# Patient Record
Sex: Male | Born: 1940
Health system: Southern US, Community
[De-identification: ages and names within clinical notes are randomized; demographics above are authoritative.]

## PROBLEM LIST (undated history)

## (undated) DIAGNOSIS — Z8619 Personal history of other infectious and parasitic diseases: Secondary | ICD-10-CM

## (undated) DIAGNOSIS — H269 Unspecified cataract: Secondary | ICD-10-CM

## (undated) DIAGNOSIS — M1990 Unspecified osteoarthritis, unspecified site: Secondary | ICD-10-CM

## (undated) DIAGNOSIS — K429 Umbilical hernia without obstruction or gangrene: Secondary | ICD-10-CM

## (undated) DIAGNOSIS — M719 Bursopathy, unspecified: Secondary | ICD-10-CM

## (undated) DIAGNOSIS — N200 Calculus of kidney: Secondary | ICD-10-CM

## (undated) DIAGNOSIS — N529 Male erectile dysfunction, unspecified: Secondary | ICD-10-CM

## (undated) DIAGNOSIS — Z860100 Personal history of colon polyps, unspecified: Secondary | ICD-10-CM

## (undated) DIAGNOSIS — J45909 Unspecified asthma, uncomplicated: Secondary | ICD-10-CM

## (undated) DIAGNOSIS — T7840XA Allergy, unspecified, initial encounter: Secondary | ICD-10-CM

## (undated) DIAGNOSIS — Z87442 Personal history of urinary calculi: Secondary | ICD-10-CM

## (undated) DIAGNOSIS — C801 Malignant (primary) neoplasm, unspecified: Secondary | ICD-10-CM

## (undated) DIAGNOSIS — K649 Unspecified hemorrhoids: Secondary | ICD-10-CM

## (undated) DIAGNOSIS — E7800 Pure hypercholesterolemia, unspecified: Secondary | ICD-10-CM

## (undated) DIAGNOSIS — R972 Elevated prostate specific antigen [PSA]: Secondary | ICD-10-CM

## (undated) DIAGNOSIS — N400 Enlarged prostate without lower urinary tract symptoms: Secondary | ICD-10-CM

## (undated) DIAGNOSIS — D075 Carcinoma in situ of prostate: Secondary | ICD-10-CM

## (undated) DIAGNOSIS — E291 Testicular hypofunction: Secondary | ICD-10-CM

## (undated) DIAGNOSIS — M48061 Spinal stenosis, lumbar region without neurogenic claudication: Secondary | ICD-10-CM

## (undated) DIAGNOSIS — R011 Cardiac murmur, unspecified: Secondary | ICD-10-CM

## (undated) HISTORY — PX: CATARACT EXTRACTION, BILATERAL: SHX1313

## (undated) HISTORY — PX: EYE SURGERY: SHX253

## (undated) HISTORY — PX: COLONOSCOPY: SHX174

---

## 1962-01-10 HISTORY — PX: HERNIA REPAIR: SHX51

## 1982-01-10 HISTORY — PX: KNEE SURGERY: SHX244

## 2003-01-11 HISTORY — PX: KNEE SURGERY: SHX244

## 2003-01-17 ENCOUNTER — Encounter: Admission: RE | Admit: 2003-01-17 | Discharge: 2003-01-17 | Payer: Self-pay | Admitting: Allergy

## 2005-01-10 HISTORY — PX: CARPAL TUNNEL RELEASE: SHX101

## 2005-01-18 ENCOUNTER — Ambulatory Visit: Payer: Self-pay | Admitting: Unknown Physician Specialty

## 2005-06-29 ENCOUNTER — Ambulatory Visit: Payer: Self-pay | Admitting: Urology

## 2005-11-16 ENCOUNTER — Ambulatory Visit: Payer: Self-pay | Admitting: Urology

## 2006-04-06 ENCOUNTER — Emergency Department: Payer: Self-pay | Admitting: Unknown Physician Specialty

## 2006-04-14 ENCOUNTER — Emergency Department: Payer: Self-pay | Admitting: Unknown Physician Specialty

## 2007-01-01 ENCOUNTER — Ambulatory Visit: Payer: Self-pay | Admitting: Urology

## 2007-10-10 ENCOUNTER — Other Ambulatory Visit: Payer: Self-pay

## 2007-10-10 ENCOUNTER — Inpatient Hospital Stay: Payer: Self-pay | Admitting: General Practice

## 2007-10-10 HISTORY — PX: ANKLE FRACTURE SURGERY: SHX122

## 2007-10-10 HISTORY — PX: FRACTURE SURGERY: SHX138

## 2007-10-10 HISTORY — PX: FEMUR FRACTURE SURGERY: SHX633

## 2007-10-23 ENCOUNTER — Encounter: Payer: Self-pay | Admitting: Internal Medicine

## 2007-11-11 ENCOUNTER — Encounter: Payer: Self-pay | Admitting: Internal Medicine

## 2008-01-14 ENCOUNTER — Encounter: Payer: Self-pay | Admitting: General Practice

## 2008-01-25 ENCOUNTER — Ambulatory Visit: Payer: Self-pay | Admitting: Urology

## 2008-02-11 ENCOUNTER — Encounter: Payer: Self-pay | Admitting: General Practice

## 2008-03-10 ENCOUNTER — Encounter: Payer: Self-pay | Admitting: General Practice

## 2008-04-10 ENCOUNTER — Encounter: Payer: Self-pay | Admitting: General Practice

## 2008-05-10 ENCOUNTER — Encounter: Payer: Self-pay | Admitting: General Practice

## 2008-05-14 ENCOUNTER — Ambulatory Visit: Payer: Self-pay | Admitting: Unknown Physician Specialty

## 2008-10-27 ENCOUNTER — Ambulatory Visit: Payer: Self-pay | Admitting: Urology

## 2009-11-04 ENCOUNTER — Ambulatory Visit: Payer: Self-pay | Admitting: Urology

## 2011-05-02 ENCOUNTER — Emergency Department: Payer: Self-pay | Admitting: Emergency Medicine

## 2011-12-01 DIAGNOSIS — Z87898 Personal history of other specified conditions: Secondary | ICD-10-CM | POA: Insufficient documentation

## 2011-12-01 DIAGNOSIS — N200 Calculus of kidney: Secondary | ICD-10-CM | POA: Insufficient documentation

## 2011-12-01 DIAGNOSIS — N529 Male erectile dysfunction, unspecified: Secondary | ICD-10-CM | POA: Insufficient documentation

## 2011-12-01 DIAGNOSIS — D075 Carcinoma in situ of prostate: Secondary | ICD-10-CM | POA: Insufficient documentation

## 2012-04-18 ENCOUNTER — Emergency Department: Payer: Self-pay | Admitting: Emergency Medicine

## 2012-04-18 LAB — COMPREHENSIVE METABOLIC PANEL
Albumin: 3.3 g/dL — ABNORMAL LOW (ref 3.4–5.0)
Anion Gap: 6 — ABNORMAL LOW (ref 7–16)
Bilirubin,Total: 0.8 mg/dL (ref 0.2–1.0)
Calcium, Total: 8.1 mg/dL — ABNORMAL LOW (ref 8.5–10.1)
Chloride: 107 mmol/L (ref 98–107)
Creatinine: 0.92 mg/dL (ref 0.60–1.30)
EGFR (Non-African Amer.): >60
Glucose: 102 mg/dL — ABNORMAL HIGH (ref 65–99)
SGOT(AST): 44 U/L — ABNORMAL HIGH (ref 15–37)
Sodium: 136 mmol/L (ref 136–145)
Total Protein: 7 g/dL (ref 6.4–8.2)

## 2012-04-18 LAB — CBC
HGB: 14.7 g/dL (ref 13.0–18.0)
MCH: 33.1 pg (ref 26.0–34.0)
MCV: 96 fL (ref 80–100)
Platelet: 232 10*3/uL (ref 150–440)
RDW: 12.5 % (ref 11.5–14.5)
WBC: 7 10*3/uL (ref 3.8–10.6)

## 2012-04-18 LAB — TROPONIN I: Troponin-I: <0.02 ng/mL

## 2012-04-23 ENCOUNTER — Ambulatory Visit: Payer: Self-pay | Admitting: Urology

## 2012-04-26 DIAGNOSIS — N201 Calculus of ureter: Secondary | ICD-10-CM | POA: Insufficient documentation

## 2012-04-30 ENCOUNTER — Ambulatory Visit: Payer: Self-pay | Admitting: Urology

## 2012-05-01 ENCOUNTER — Ambulatory Visit: Payer: Self-pay | Admitting: Urology

## 2012-12-20 ENCOUNTER — Ambulatory Visit: Payer: Self-pay | Admitting: Urology

## 2013-07-15 DIAGNOSIS — N138 Other obstructive and reflux uropathy: Secondary | ICD-10-CM | POA: Insufficient documentation

## 2013-07-15 DIAGNOSIS — D126 Benign neoplasm of colon, unspecified: Secondary | ICD-10-CM | POA: Insufficient documentation

## 2013-07-15 DIAGNOSIS — N401 Enlarged prostate with lower urinary tract symptoms: Secondary | ICD-10-CM | POA: Insufficient documentation

## 2013-07-15 DIAGNOSIS — N23 Unspecified renal colic: Secondary | ICD-10-CM | POA: Insufficient documentation

## 2013-11-20 ENCOUNTER — Emergency Department: Payer: Self-pay | Admitting: Emergency Medicine

## 2014-02-05 DIAGNOSIS — X32XXXA Exposure to sunlight, initial encounter: Secondary | ICD-10-CM | POA: Diagnosis not present

## 2014-02-05 DIAGNOSIS — L821 Other seborrheic keratosis: Secondary | ICD-10-CM | POA: Diagnosis not present

## 2014-02-05 DIAGNOSIS — D492 Neoplasm of unspecified behavior of bone, soft tissue, and skin: Secondary | ICD-10-CM | POA: Diagnosis not present

## 2014-02-05 DIAGNOSIS — Z85828 Personal history of other malignant neoplasm of skin: Secondary | ICD-10-CM | POA: Diagnosis not present

## 2014-02-05 DIAGNOSIS — D485 Neoplasm of uncertain behavior of skin: Secondary | ICD-10-CM | POA: Diagnosis not present

## 2014-02-05 DIAGNOSIS — L570 Actinic keratosis: Secondary | ICD-10-CM | POA: Diagnosis not present

## 2014-02-06 DIAGNOSIS — N401 Enlarged prostate with lower urinary tract symptoms: Secondary | ICD-10-CM | POA: Diagnosis not present

## 2014-02-06 DIAGNOSIS — N529 Male erectile dysfunction, unspecified: Secondary | ICD-10-CM | POA: Diagnosis not present

## 2014-02-06 DIAGNOSIS — R339 Retention of urine, unspecified: Secondary | ICD-10-CM | POA: Insufficient documentation

## 2014-02-06 DIAGNOSIS — R351 Nocturia: Secondary | ICD-10-CM | POA: Diagnosis not present

## 2014-02-06 DIAGNOSIS — C61 Malignant neoplasm of prostate: Secondary | ICD-10-CM | POA: Diagnosis not present

## 2014-02-06 DIAGNOSIS — R972 Elevated prostate specific antigen [PSA]: Secondary | ICD-10-CM | POA: Diagnosis not present

## 2014-02-06 DIAGNOSIS — L820 Inflamed seborrheic keratosis: Secondary | ICD-10-CM | POA: Diagnosis not present

## 2014-02-06 DIAGNOSIS — D075 Carcinoma in situ of prostate: Secondary | ICD-10-CM | POA: Diagnosis not present

## 2014-02-06 DIAGNOSIS — K589 Irritable bowel syndrome without diarrhea: Secondary | ICD-10-CM | POA: Diagnosis not present

## 2014-02-06 DIAGNOSIS — N200 Calculus of kidney: Secondary | ICD-10-CM | POA: Diagnosis not present

## 2014-02-06 DIAGNOSIS — D0462 Carcinoma in situ of skin of left upper limb, including shoulder: Secondary | ICD-10-CM | POA: Diagnosis not present

## 2014-02-06 DIAGNOSIS — N201 Calculus of ureter: Secondary | ICD-10-CM | POA: Diagnosis not present

## 2014-02-06 DIAGNOSIS — M2550 Pain in unspecified joint: Secondary | ICD-10-CM | POA: Diagnosis not present

## 2014-02-07 DIAGNOSIS — N63 Unspecified lump in breast: Secondary | ICD-10-CM | POA: Diagnosis not present

## 2014-02-10 DIAGNOSIS — D0462 Carcinoma in situ of skin of left upper limb, including shoulder: Secondary | ICD-10-CM | POA: Diagnosis not present

## 2014-02-19 DIAGNOSIS — D171 Benign lipomatous neoplasm of skin and subcutaneous tissue of trunk: Secondary | ICD-10-CM | POA: Diagnosis not present

## 2014-02-19 DIAGNOSIS — D179 Benign lipomatous neoplasm, unspecified: Secondary | ICD-10-CM | POA: Diagnosis not present

## 2014-04-23 DIAGNOSIS — E7800 Pure hypercholesterolemia, unspecified: Secondary | ICD-10-CM | POA: Insufficient documentation

## 2014-04-23 DIAGNOSIS — J45909 Unspecified asthma, uncomplicated: Secondary | ICD-10-CM | POA: Insufficient documentation

## 2014-04-24 DIAGNOSIS — Z125 Encounter for screening for malignant neoplasm of prostate: Secondary | ICD-10-CM | POA: Diagnosis not present

## 2014-05-02 NOTE — Op Note (Signed)
PATIENT NAME:  Devin Landry, Devin Landry MR#:  [id number] DATE OF BIRTH:  1940/06/22  DATE OF PROCEDURE:  05/01/2012  PRINCIPAL DIAGNOSIS: Left ureterolithiasis.   POSTOPERATIVE DIAGNOSIS: Left ureterolithiasis.   PROCEDURE: Left ureteroscopy, holmium laser lithotripsy.   SURGEON: Denice Bors. Jacqlyn Larsen, MD.   ANESTHESIA: General endotracheal anesthesia.   INDICATION: The patient is a 74 year old gentleman with a history of nephrolithiasis. He presented with left flank pain. He underwent recent evaluation demonstrating a distal left ureteral calculus. He presents for left ureteroscopy and holmium laser lithotripsy.   DESCRIPTION OF PROCEDURE: After informed consent was obtained, the patient was taken to the operating room and placed in the dorsal lithotomy position under laryengeal airway anesthesia. He was then prepped and draped in the usual standard fashion. Cystoscopy was performed with a 22-French rigid cystoscope. No significant urethral abnormalities were noted. Upon entering the prostatic urethra, moderate bilobar prostatic hypertrophy was noted. Upon entering the bladder, the mucosa was inspected in its entirety with no gross mucosal lesions noted. Bilateral ureteral orifices were well visualized with no lesions noted. A flexible-tip Glidewire was introduced in the left ureteral orifice and advanced into the upper pole collecting system under fluoroscopic guidance without difficulty. The cystoscope was then removed. The 6-French rigid ureteroscope was advanced into the urinary bladder and was advanced into the left ureteral orifice without difficulty. Approximately 3 cm into the left ureteral orifice, an approximately 7 mm stone was encountered. Moderate inflammatory changes were noted at the site of stone impaction. The holmium laser fiber was then inserted through the scope. The stone was then fragmented into multiple smaller pieces utilizing the holmium laser fiber. Once the stone was fragmented, the basket  was then utilized to remove the stone fragments utilizing multiple stone passages. Once the multiple stone fragments were removed from the ureter, the scope was advanced to the level of the ureteropelvic junction. No additional stone fragments were noted. Moderate dilation of the proximal and mid ureter was noted throughout the entirety with no additional abnormalities noted. The ureteroscope was then removed. The cystoscope was replaced into the urinary bladder. The stone fragments were collected and will be sent for stone analysis. The decision was made not to place a ureteral stent. The bladder was then drained. The cystoscope was removed. The patient was returned to the supine position and awakened from laryngeal mask airway anesthesia. He was taken to the recovery room in stable condition. There were no problems or complications. The patient tolerated the procedure well.   ESTIMATED BLOOD LOSS: Minimal.    ____________________________ Denice Bors. Jacqlyn Larsen, MD bsc:gb D: 05/04/2012 00:32:01 ET T: 05/04/2012 02:16:36 ET JOB#: [id number]  cc: Denice Bors. Jacqlyn Larsen, MD, <Dictator> Denice Bors Dalynn Jhaveri MD ELECTRONICALLY SIGNED 05/04/2012 8:23

## 2014-06-11 DIAGNOSIS — M5441 Lumbago with sciatica, right side: Secondary | ICD-10-CM | POA: Diagnosis not present

## 2014-06-30 DIAGNOSIS — N400 Enlarged prostate without lower urinary tract symptoms: Secondary | ICD-10-CM | POA: Diagnosis not present

## 2014-06-30 DIAGNOSIS — E780 Pure hypercholesterolemia: Secondary | ICD-10-CM | POA: Diagnosis not present

## 2014-06-30 DIAGNOSIS — J4520 Mild intermittent asthma, uncomplicated: Secondary | ICD-10-CM | POA: Diagnosis not present

## 2014-06-30 DIAGNOSIS — E119 Type 2 diabetes mellitus without complications: Secondary | ICD-10-CM | POA: Diagnosis not present

## 2014-07-07 DIAGNOSIS — Z0001 Encounter for general adult medical examination with abnormal findings: Secondary | ICD-10-CM | POA: Diagnosis not present

## 2014-07-08 DIAGNOSIS — Z85828 Personal history of other malignant neoplasm of skin: Secondary | ICD-10-CM | POA: Diagnosis not present

## 2014-07-08 DIAGNOSIS — D485 Neoplasm of uncertain behavior of skin: Secondary | ICD-10-CM | POA: Diagnosis not present

## 2014-07-08 DIAGNOSIS — L821 Other seborrheic keratosis: Secondary | ICD-10-CM | POA: Diagnosis not present

## 2014-07-08 DIAGNOSIS — L570 Actinic keratosis: Secondary | ICD-10-CM | POA: Diagnosis not present

## 2014-07-08 DIAGNOSIS — C44519 Basal cell carcinoma of skin of other part of trunk: Secondary | ICD-10-CM | POA: Diagnosis not present

## 2014-07-08 DIAGNOSIS — X32XXXA Exposure to sunlight, initial encounter: Secondary | ICD-10-CM | POA: Diagnosis not present

## 2014-07-16 DIAGNOSIS — R1031 Right lower quadrant pain: Secondary | ICD-10-CM | POA: Diagnosis not present

## 2014-07-22 DIAGNOSIS — R1031 Right lower quadrant pain: Secondary | ICD-10-CM | POA: Diagnosis not present

## 2014-08-19 DIAGNOSIS — L905 Scar conditions and fibrosis of skin: Secondary | ICD-10-CM | POA: Diagnosis not present

## 2014-08-19 DIAGNOSIS — C44519 Basal cell carcinoma of skin of other part of trunk: Secondary | ICD-10-CM | POA: Diagnosis not present

## 2014-09-29 DIAGNOSIS — E119 Type 2 diabetes mellitus without complications: Secondary | ICD-10-CM | POA: Diagnosis not present

## 2014-10-06 DIAGNOSIS — E1165 Type 2 diabetes mellitus with hyperglycemia: Secondary | ICD-10-CM | POA: Diagnosis not present

## 2014-10-06 DIAGNOSIS — Z23 Encounter for immunization: Secondary | ICD-10-CM | POA: Diagnosis not present

## 2014-10-06 DIAGNOSIS — J4520 Mild intermittent asthma, uncomplicated: Secondary | ICD-10-CM | POA: Diagnosis not present

## 2014-11-25 DIAGNOSIS — X32XXXA Exposure to sunlight, initial encounter: Secondary | ICD-10-CM | POA: Diagnosis not present

## 2014-11-25 DIAGNOSIS — D225 Melanocytic nevi of trunk: Secondary | ICD-10-CM | POA: Diagnosis not present

## 2014-11-25 DIAGNOSIS — D2262 Melanocytic nevi of left upper limb, including shoulder: Secondary | ICD-10-CM | POA: Diagnosis not present

## 2014-11-25 DIAGNOSIS — L570 Actinic keratosis: Secondary | ICD-10-CM | POA: Diagnosis not present

## 2014-11-25 DIAGNOSIS — L821 Other seborrheic keratosis: Secondary | ICD-10-CM | POA: Diagnosis not present

## 2014-11-25 DIAGNOSIS — Z85828 Personal history of other malignant neoplasm of skin: Secondary | ICD-10-CM | POA: Diagnosis not present

## 2014-12-23 DIAGNOSIS — J0120 Acute ethmoidal sinusitis, unspecified: Secondary | ICD-10-CM | POA: Diagnosis not present

## 2014-12-30 DIAGNOSIS — E1165 Type 2 diabetes mellitus with hyperglycemia: Secondary | ICD-10-CM | POA: Diagnosis not present

## 2015-01-06 DIAGNOSIS — E1165 Type 2 diabetes mellitus with hyperglycemia: Secondary | ICD-10-CM | POA: Diagnosis not present

## 2015-01-06 DIAGNOSIS — N200 Calculus of kidney: Secondary | ICD-10-CM | POA: Diagnosis not present

## 2015-01-06 DIAGNOSIS — J4520 Mild intermittent asthma, uncomplicated: Secondary | ICD-10-CM | POA: Diagnosis not present

## 2015-01-06 DIAGNOSIS — M25579 Pain in unspecified ankle and joints of unspecified foot: Secondary | ICD-10-CM | POA: Diagnosis not present

## 2015-01-13 DIAGNOSIS — M19172 Post-traumatic osteoarthritis, left ankle and foot: Secondary | ICD-10-CM | POA: Diagnosis not present

## 2015-01-13 DIAGNOSIS — L03031 Cellulitis of right toe: Secondary | ICD-10-CM | POA: Diagnosis not present

## 2015-01-13 DIAGNOSIS — M25572 Pain in left ankle and joints of left foot: Secondary | ICD-10-CM | POA: Diagnosis not present

## 2015-01-27 DIAGNOSIS — L03031 Cellulitis of right toe: Secondary | ICD-10-CM | POA: Diagnosis not present

## 2015-01-29 DIAGNOSIS — H524 Presbyopia: Secondary | ICD-10-CM | POA: Diagnosis not present

## 2015-01-29 DIAGNOSIS — H5210 Myopia, unspecified eye: Secondary | ICD-10-CM | POA: Diagnosis not present

## 2015-02-05 DIAGNOSIS — L309 Dermatitis, unspecified: Secondary | ICD-10-CM | POA: Diagnosis not present

## 2015-02-05 DIAGNOSIS — L538 Other specified erythematous conditions: Secondary | ICD-10-CM | POA: Diagnosis not present

## 2015-02-05 DIAGNOSIS — L0889 Other specified local infections of the skin and subcutaneous tissue: Secondary | ICD-10-CM | POA: Diagnosis not present

## 2015-02-05 DIAGNOSIS — B078 Other viral warts: Secondary | ICD-10-CM | POA: Diagnosis not present

## 2015-02-05 DIAGNOSIS — R208 Other disturbances of skin sensation: Secondary | ICD-10-CM | POA: Diagnosis not present

## 2015-02-11 DIAGNOSIS — N200 Calculus of kidney: Secondary | ICD-10-CM | POA: Diagnosis not present

## 2015-02-11 DIAGNOSIS — R339 Retention of urine, unspecified: Secondary | ICD-10-CM | POA: Diagnosis not present

## 2015-02-11 DIAGNOSIS — N3289 Other specified disorders of bladder: Secondary | ICD-10-CM | POA: Diagnosis not present

## 2015-02-11 DIAGNOSIS — R972 Elevated prostate specific antigen [PSA]: Secondary | ICD-10-CM | POA: Diagnosis not present

## 2015-02-11 DIAGNOSIS — D075 Carcinoma in situ of prostate: Secondary | ICD-10-CM | POA: Diagnosis not present

## 2015-02-11 DIAGNOSIS — N401 Enlarged prostate with lower urinary tract symptoms: Secondary | ICD-10-CM | POA: Diagnosis not present

## 2015-02-11 DIAGNOSIS — N138 Other obstructive and reflux uropathy: Secondary | ICD-10-CM | POA: Diagnosis not present

## 2015-02-20 DIAGNOSIS — J042 Acute laryngotracheitis: Secondary | ICD-10-CM | POA: Diagnosis not present

## 2015-05-14 DIAGNOSIS — E1165 Type 2 diabetes mellitus with hyperglycemia: Secondary | ICD-10-CM | POA: Diagnosis not present

## 2015-05-19 DIAGNOSIS — R7301 Impaired fasting glucose: Secondary | ICD-10-CM | POA: Diagnosis not present

## 2015-05-19 DIAGNOSIS — Z125 Encounter for screening for malignant neoplasm of prostate: Secondary | ICD-10-CM | POA: Diagnosis not present

## 2015-05-19 DIAGNOSIS — E7800 Pure hypercholesterolemia, unspecified: Secondary | ICD-10-CM | POA: Diagnosis not present

## 2015-05-26 DIAGNOSIS — L570 Actinic keratosis: Secondary | ICD-10-CM | POA: Diagnosis not present

## 2015-05-26 DIAGNOSIS — D225 Melanocytic nevi of trunk: Secondary | ICD-10-CM | POA: Diagnosis not present

## 2015-05-26 DIAGNOSIS — D2271 Melanocytic nevi of right lower limb, including hip: Secondary | ICD-10-CM | POA: Diagnosis not present

## 2015-05-26 DIAGNOSIS — Z85828 Personal history of other malignant neoplasm of skin: Secondary | ICD-10-CM | POA: Diagnosis not present

## 2015-05-26 DIAGNOSIS — X32XXXA Exposure to sunlight, initial encounter: Secondary | ICD-10-CM | POA: Diagnosis not present

## 2015-05-26 DIAGNOSIS — I8311 Varicose veins of right lower extremity with inflammation: Secondary | ICD-10-CM | POA: Diagnosis not present

## 2015-08-12 DIAGNOSIS — E7800 Pure hypercholesterolemia, unspecified: Secondary | ICD-10-CM | POA: Diagnosis not present

## 2015-08-12 DIAGNOSIS — R7301 Impaired fasting glucose: Secondary | ICD-10-CM | POA: Diagnosis not present

## 2015-08-12 DIAGNOSIS — Z125 Encounter for screening for malignant neoplasm of prostate: Secondary | ICD-10-CM | POA: Diagnosis not present

## 2015-08-19 DIAGNOSIS — Z0001 Encounter for general adult medical examination with abnormal findings: Secondary | ICD-10-CM | POA: Diagnosis not present

## 2015-08-19 DIAGNOSIS — E1165 Type 2 diabetes mellitus with hyperglycemia: Secondary | ICD-10-CM | POA: Diagnosis not present

## 2015-08-19 DIAGNOSIS — E7800 Pure hypercholesterolemia, unspecified: Secondary | ICD-10-CM | POA: Diagnosis not present

## 2015-11-06 DIAGNOSIS — Z23 Encounter for immunization: Secondary | ICD-10-CM | POA: Diagnosis not present

## 2015-11-06 DIAGNOSIS — Z0000 Encounter for general adult medical examination without abnormal findings: Secondary | ICD-10-CM | POA: Diagnosis not present

## 2015-12-02 DIAGNOSIS — R739 Hyperglycemia, unspecified: Secondary | ICD-10-CM | POA: Diagnosis not present

## 2015-12-09 DIAGNOSIS — K006 Disturbances in tooth eruption: Secondary | ICD-10-CM | POA: Diagnosis not present

## 2015-12-14 DIAGNOSIS — E1165 Type 2 diabetes mellitus with hyperglycemia: Secondary | ICD-10-CM | POA: Diagnosis not present

## 2015-12-14 DIAGNOSIS — J45909 Unspecified asthma, uncomplicated: Secondary | ICD-10-CM | POA: Diagnosis not present

## 2016-01-26 DIAGNOSIS — L821 Other seborrheic keratosis: Secondary | ICD-10-CM | POA: Diagnosis not present

## 2016-01-26 DIAGNOSIS — L570 Actinic keratosis: Secondary | ICD-10-CM | POA: Diagnosis not present

## 2016-01-26 DIAGNOSIS — Z85828 Personal history of other malignant neoplasm of skin: Secondary | ICD-10-CM | POA: Diagnosis not present

## 2016-01-26 DIAGNOSIS — D485 Neoplasm of uncertain behavior of skin: Secondary | ICD-10-CM | POA: Diagnosis not present

## 2016-01-26 DIAGNOSIS — C44619 Basal cell carcinoma of skin of left upper limb, including shoulder: Secondary | ICD-10-CM | POA: Diagnosis not present

## 2016-01-26 DIAGNOSIS — X32XXXA Exposure to sunlight, initial encounter: Secondary | ICD-10-CM | POA: Diagnosis not present

## 2016-01-26 DIAGNOSIS — D225 Melanocytic nevi of trunk: Secondary | ICD-10-CM | POA: Diagnosis not present

## 2016-02-01 DIAGNOSIS — C44619 Basal cell carcinoma of skin of left upper limb, including shoulder: Secondary | ICD-10-CM | POA: Diagnosis not present

## 2016-02-03 DIAGNOSIS — H524 Presbyopia: Secondary | ICD-10-CM | POA: Diagnosis not present

## 2016-03-08 DIAGNOSIS — E1165 Type 2 diabetes mellitus with hyperglycemia: Secondary | ICD-10-CM | POA: Diagnosis not present

## 2016-03-15 DIAGNOSIS — E7800 Pure hypercholesterolemia, unspecified: Secondary | ICD-10-CM | POA: Diagnosis not present

## 2016-03-15 DIAGNOSIS — R739 Hyperglycemia, unspecified: Secondary | ICD-10-CM | POA: Diagnosis not present

## 2016-03-24 DIAGNOSIS — N200 Calculus of kidney: Secondary | ICD-10-CM | POA: Diagnosis not present

## 2016-03-24 DIAGNOSIS — R972 Elevated prostate specific antigen [PSA]: Secondary | ICD-10-CM | POA: Diagnosis not present

## 2016-03-24 DIAGNOSIS — N138 Other obstructive and reflux uropathy: Secondary | ICD-10-CM | POA: Diagnosis not present

## 2016-03-24 DIAGNOSIS — R339 Retention of urine, unspecified: Secondary | ICD-10-CM | POA: Diagnosis not present

## 2016-03-24 DIAGNOSIS — D075 Carcinoma in situ of prostate: Secondary | ICD-10-CM | POA: Diagnosis not present

## 2016-03-24 DIAGNOSIS — N401 Enlarged prostate with lower urinary tract symptoms: Secondary | ICD-10-CM | POA: Diagnosis not present

## 2016-05-31 DIAGNOSIS — D2272 Melanocytic nevi of left lower limb, including hip: Secondary | ICD-10-CM | POA: Diagnosis not present

## 2016-05-31 DIAGNOSIS — Z85828 Personal history of other malignant neoplasm of skin: Secondary | ICD-10-CM | POA: Diagnosis not present

## 2016-05-31 DIAGNOSIS — X32XXXA Exposure to sunlight, initial encounter: Secondary | ICD-10-CM | POA: Diagnosis not present

## 2016-05-31 DIAGNOSIS — D2262 Melanocytic nevi of left upper limb, including shoulder: Secondary | ICD-10-CM | POA: Diagnosis not present

## 2016-05-31 DIAGNOSIS — D225 Melanocytic nevi of trunk: Secondary | ICD-10-CM | POA: Diagnosis not present

## 2016-05-31 DIAGNOSIS — D485 Neoplasm of uncertain behavior of skin: Secondary | ICD-10-CM | POA: Diagnosis not present

## 2016-05-31 DIAGNOSIS — C4441 Basal cell carcinoma of skin of scalp and neck: Secondary | ICD-10-CM | POA: Diagnosis not present

## 2016-05-31 DIAGNOSIS — L570 Actinic keratosis: Secondary | ICD-10-CM | POA: Diagnosis not present

## 2016-06-14 DIAGNOSIS — D0422 Carcinoma in situ of skin of left ear and external auricular canal: Secondary | ICD-10-CM | POA: Diagnosis not present

## 2016-06-14 DIAGNOSIS — C4441 Basal cell carcinoma of skin of scalp and neck: Secondary | ICD-10-CM | POA: Diagnosis not present

## 2016-06-20 DIAGNOSIS — Z86010 Personal history of colonic polyps: Secondary | ICD-10-CM | POA: Diagnosis not present

## 2016-09-09 DIAGNOSIS — R739 Hyperglycemia, unspecified: Secondary | ICD-10-CM | POA: Diagnosis not present

## 2016-09-09 DIAGNOSIS — E7800 Pure hypercholesterolemia, unspecified: Secondary | ICD-10-CM | POA: Diagnosis not present

## 2016-09-15 DIAGNOSIS — E7800 Pure hypercholesterolemia, unspecified: Secondary | ICD-10-CM | POA: Diagnosis not present

## 2016-09-15 DIAGNOSIS — N400 Enlarged prostate without lower urinary tract symptoms: Secondary | ICD-10-CM | POA: Diagnosis not present

## 2016-09-15 DIAGNOSIS — Z0001 Encounter for general adult medical examination with abnormal findings: Secondary | ICD-10-CM | POA: Diagnosis not present

## 2016-09-15 DIAGNOSIS — J45909 Unspecified asthma, uncomplicated: Secondary | ICD-10-CM | POA: Diagnosis not present

## 2016-09-20 ENCOUNTER — Encounter: Payer: Self-pay | Admitting: *Deleted

## 2016-09-21 ENCOUNTER — Ambulatory Visit
Admission: RE | Admit: 2016-09-21 | Discharge: 2016-09-21 | Disposition: A | Discharge status: Home / Self Care | Payer: Medicare HMO | Source: Ambulatory Visit | Attending: Unknown Physician Specialty | Admitting: Unknown Physician Specialty

## 2016-09-21 ENCOUNTER — Encounter: Payer: Self-pay | Admitting: Anesthesiology

## 2016-09-21 ENCOUNTER — Ambulatory Visit: Payer: Medicare HMO | Admitting: Anesthesiology

## 2016-09-21 ENCOUNTER — Encounter
Admission: RE | Disposition: A | Discharge status: Home / Self Care | Payer: Self-pay | Source: Ambulatory Visit | Attending: Unknown Physician Specialty

## 2016-09-21 DIAGNOSIS — Z79899 Other long term (current) drug therapy: Secondary | ICD-10-CM | POA: Insufficient documentation

## 2016-09-21 DIAGNOSIS — K5730 Diverticulosis of large intestine without perforation or abscess without bleeding: Secondary | ICD-10-CM | POA: Diagnosis not present

## 2016-09-21 DIAGNOSIS — Z1211 Encounter for screening for malignant neoplasm of colon: Secondary | ICD-10-CM | POA: Diagnosis not present

## 2016-09-21 DIAGNOSIS — Z87891 Personal history of nicotine dependence: Secondary | ICD-10-CM | POA: Diagnosis not present

## 2016-09-21 DIAGNOSIS — J45909 Unspecified asthma, uncomplicated: Secondary | ICD-10-CM | POA: Insufficient documentation

## 2016-09-21 DIAGNOSIS — K5790 Diverticulosis of intestine, part unspecified, without perforation or abscess without bleeding: Secondary | ICD-10-CM | POA: Diagnosis not present

## 2016-09-21 DIAGNOSIS — Z87442 Personal history of urinary calculi: Secondary | ICD-10-CM | POA: Diagnosis not present

## 2016-09-21 DIAGNOSIS — K648 Other hemorrhoids: Secondary | ICD-10-CM | POA: Diagnosis not present

## 2016-09-21 DIAGNOSIS — M1990 Unspecified osteoarthritis, unspecified site: Secondary | ICD-10-CM | POA: Diagnosis not present

## 2016-09-21 DIAGNOSIS — K640 First degree hemorrhoids: Secondary | ICD-10-CM | POA: Insufficient documentation

## 2016-09-21 DIAGNOSIS — Z86010 Personal history of colonic polyps: Secondary | ICD-10-CM | POA: Diagnosis not present

## 2016-09-21 DIAGNOSIS — Z7982 Long term (current) use of aspirin: Secondary | ICD-10-CM | POA: Insufficient documentation

## 2016-09-21 DIAGNOSIS — N400 Enlarged prostate without lower urinary tract symptoms: Secondary | ICD-10-CM | POA: Insufficient documentation

## 2016-09-21 HISTORY — DX: Unspecified hemorrhoids: K64.9

## 2016-09-21 HISTORY — PX: COLONOSCOPY WITH PROPOFOL: SHX5780

## 2016-09-21 HISTORY — DX: Unspecified osteoarthritis, unspecified site: M19.90

## 2016-09-21 HISTORY — DX: Testicular hypofunction: E29.1

## 2016-09-21 HISTORY — DX: Personal history of colonic polyps: Z86.010

## 2016-09-21 HISTORY — DX: Unspecified asthma, uncomplicated: J45.909

## 2016-09-21 HISTORY — DX: Personal history of other infectious and parasitic diseases: Z86.19

## 2016-09-21 HISTORY — DX: Calculus of kidney: N20.0

## 2016-09-21 HISTORY — DX: Benign prostatic hyperplasia without lower urinary tract symptoms: N40.0

## 2016-09-21 HISTORY — DX: Personal history of urinary calculi: Z87.442

## 2016-09-21 HISTORY — DX: Personal history of colon polyps, unspecified: Z86.0100

## 2016-09-21 HISTORY — DX: Allergy, unspecified, initial encounter: T78.40XA

## 2016-09-21 HISTORY — DX: Unspecified cataract: H26.9

## 2016-09-21 SURGERY — COLONOSCOPY WITH PROPOFOL
Anesthesia: General

## 2016-09-21 MED ORDER — PROPOFOL 10 MG/ML IV BOLUS
INTRAVENOUS | Status: AC
  Filled 2016-09-21: qty 60

## 2016-09-21 MED ORDER — SODIUM CHLORIDE 0.9 % IV SOLN
INTRAVENOUS | Status: DC
  Administered 2016-09-21 (×2): via INTRAVENOUS

## 2016-09-21 MED ORDER — SODIUM CHLORIDE 0.9 % IV SOLN: INTRAVENOUS | Status: DC

## 2016-09-21 MED ORDER — PROPOFOL 500 MG/50ML IV EMUL
INTRAVENOUS | Status: DC | PRN
  Administered 2016-09-21: 100 ug/kg/min via INTRAVENOUS

## 2016-09-21 MED ORDER — PROPOFOL 10 MG/ML IV BOLUS
INTRAVENOUS | Status: DC | PRN
  Administered 2016-09-21: 60 mg via INTRAVENOUS
  Administered 2016-09-21: 30 mg via INTRAVENOUS

## 2016-09-21 MED ORDER — PIPERACILLIN-TAZOBACTAM 3.375 G IVPB 30 MIN
3.3750 g | Freq: Once | INTRAVENOUS | Status: AC
  Administered 2016-09-21: 3.375 g via INTRAVENOUS
  Filled 2016-09-21: qty 50

## 2016-09-21 MED ORDER — PIPERACILLIN-TAZOBACTAM 3.375 G IVPB
INTRAVENOUS | Status: AC
  Administered 2016-09-21: 3.375 g via INTRAVENOUS
  Filled 2016-09-21: qty 50

## 2016-09-21 NOTE — Anesthesia Preprocedure Evaluation (Signed)
Anesthesia Evaluation  Patient identified by MRN, date of birth, ID band Patient awake    Reviewed: Allergy & Precautions, H&P , NPO status , Patient's Chart, lab work & pertinent test results  History of Anesthesia Complications Negative for: history of anesthetic complications  Airway Mallampati: III  TM Distance: <3 FB Neck ROM: limited    Dental  (+) Poor Dentition, Chipped, Missing, Caps, Implants   Pulmonary neg shortness of breath, asthma , former smoker,           Cardiovascular Exercise Tolerance: Good (-) angina(-) Past MI and (-) DOE negative cardio ROS       Neuro/Psych negative neurological ROS  negative psych ROS   GI/Hepatic negative GI ROS, Neg liver ROS,   Endo/Other  negative endocrine ROS  Renal/GU Renal disease  negative genitourinary   Musculoskeletal  (+) Arthritis ,   Abdominal   Peds  Hematology negative hematology ROS (+)   Anesthesia Other Findings Past Medical History: No date: Allergic state No date: Arthritis No date: Asthma No date: BPH (benign prostatic hyperplasia) No date: Cataracts, bilateral No date: Hemorrhoids No date: History of chicken pox No date: History of colon polyps No date: History of kidney stones No date: Hypogonadism in male No date: Nephrolithiasis  Past Surgical History: No date: EYE SURGERY No date: FRACTURE SURGERY; Left     Comment:  ORIF DISTAL RADIUS FRACTURE AND PERTROCHANTERIC FEMUR               FRACTURE No date: HERNIA REPAIR No date: KNEE SURGERY     Reproductive/Obstetrics negative OB ROS                             Anesthesia Physical Anesthesia Plan  ASA: III  Anesthesia Plan: General   Post-op Pain Management:    Induction: Intravenous  PONV Risk Score and Plan: Propofol infusion  Airway Management Planned: Natural Airway and Nasal Cannula  Additional Equipment:   Intra-op Plan:    Post-operative Plan:   Informed Consent: I have reviewed the patients History and Physical, chart, labs and discussed the procedure including the risks, benefits and alternatives for the proposed anesthesia with the patient or authorized representative who has indicated his/her understanding and acceptance.   Dental Advisory Given  Plan Discussed with: Anesthesiologist, CRNA and Surgeon  Anesthesia Plan Comments: (Patient consented for risks of anesthesia including but not limited to:  - adverse reactions to medications - risk of intubation if required - damage to teeth, lips or other oral mucosa - sore throat or hoarseness - Damage to heart, brain, lungs or loss of life  Patient voiced understanding.)        Anesthesia Quick Evaluation

## 2016-09-21 NOTE — Anesthesia Post-op Follow-up Note (Signed)
Anesthesia QCDR form completed.        

## 2016-09-21 NOTE — H&P (Signed)
Primary Care Physician:  Madelyn Brunner, MD Primary Gastroenterologist:  Dr. Vira Agar  Pre-Procedure History & Physical: HPI:  Devin Landry is a 76 y.o. male is here for an colonoscopy.   Past Medical History:  Diagnosis Date  . Allergic state   . Arthritis   . Asthma   . BPH (benign prostatic hyperplasia)   . Cataracts, bilateral   . Hemorrhoids   . History of chicken pox   . History of colon polyps   . History of kidney stones   . Hypogonadism in male   . Nephrolithiasis     Past Surgical History:  Procedure Laterality Date  . EYE SURGERY    . FRACTURE SURGERY Left    ORIF DISTAL RADIUS FRACTURE AND PERTROCHANTERIC FEMUR FRACTURE  . HERNIA REPAIR    . KNEE SURGERY      Prior to Admission medications   Medication Sig Start Date End Date Taking? Authorizing Provider  acetaminophen (TYLENOL) 500 MG tablet Take 500 mg by mouth every 4 (four) hours as needed.   Yes [provider]  amoxicillin (AMOXIL) 500 MG capsule Take 500 mg by mouth 4 (four) times daily.   Yes [provider]  Ascorbic Acid (VITAMIN C) 1000 MG tablet Take 1,000 mg by mouth daily.   Yes [provider]  aspirin EC 81 MG tablet Take 81 mg by mouth daily.   Yes [provider]  calcium carbonate (OSCAL) 1500 (600 Ca) MG TABS tablet Take by mouth 2 (two) times daily with a meal.   Yes [provider]  Cholecalciferol (VITAMIN D-3) 1000 units CAPS Take 1,000 Units by mouth daily.   Yes [provider]  cholestyramine Lucrezia Starch) 4 GM/DOSE powder Take 4 g by mouth daily.   Yes [provider]  diphenhydrAMINE (BENADRYL) 25 MG tablet Take 25 mg by mouth at bedtime as needed.   Yes [provider]  finasteride (PROSCAR) 5 MG tablet Take 5 mg by mouth daily.   Yes [provider]  Glucosamine-Chondroit-Vit C-Mn (GLUCOSAMINE 1500 COMPLEX PO) Take 1,500 mg by mouth 2 (two) times daily.   Yes [provider]  Multiple  Vitamin (MULTIVITAMIN) tablet Take 1 tablet by mouth daily.   Yes [provider]  naproxen sodium (ANAPROX) 220 MG tablet Take 440 mg by mouth 2 (two) times daily with a meal.   Yes [provider]  polyethylene glycol-electrolytes (NULYTELY/GOLYTELY) 420 g solution Take 4,000 mLs by mouth once.   Yes [provider]  rosuvastatin (CRESTOR) 5 MG tablet Take 5 mg by mouth daily.   Yes [provider]  tizanidine (ZANAFLEX) 2 MG capsule Take 2 mg by mouth 3 (three) times daily.   Yes [provider]  TURMERIC PO Take 500 mg by mouth daily.   Yes [provider]    Allergies as of 06/30/2016  . (Not on File)    History reviewed. No pertinent family history.  Social History   Social History  . Marital status: Married    Spouse name: N/A  . Number of children: N/A  . Years of education: N/A   Occupational History  . Not on file.   Social History Main Topics  . Smoking status: Former Smoker    Years: 20.00    Types: Pipe    Quit date: 05/15/1978  . Smokeless tobacco: Never Used  . Alcohol use No  . Drug use: No  . Sexual activity: Not on file  Other Topics Concern  . Not on file   Social History Narrative  . No narrative on file    Review of Systems: See HPI, otherwise negative ROS  Physical Exam: BP 136/83   Pulse 84   Temp (!) 96.9 F (36.1 C) (Oral)   Resp 20   Ht 6' (1.829 m)   Wt 96.2 kg (212 lb)   SpO2 96%   BMI 28.75 kg/m  General:   Alert,  pleasant and cooperative in NAD Head:  Normocephalic and atraumatic. Neck:  Supple; no masses or thyromegaly. Lungs:  Clear throughout to auscultation.    Heart:  Regular rate and rhythm. Abdomen:  Soft, nontender and nondistended. Normal bowel sounds, without guarding, and without rebound.   Neurologic:  Alert and  oriented x4;  grossly normal neurologically.  Impression/Plan: Devin Landry is here for an colonoscopy to be performed for [facility] colon  polyps.  Risks, benefits, limitations, and alternatives regarding  colonoscopy have been reviewed with the patient.  Questions have been answered.  All parties agreeable.   Gaylyn Cheers, MD  09/21/2016, 1:28 PM

## 2016-09-21 NOTE — Anesthesia Postprocedure Evaluation (Signed)
Anesthesia Post Note  Patient: Devin Landry  Procedure(s) Performed: Procedure(s) (LRB): COLONOSCOPY WITH PROPOFOL (N/A)  Patient location during evaluation: PACU Anesthesia Type: General Level of consciousness: awake and alert and oriented Pain management: pain level controlled Vital Signs Assessment: post-procedure vital signs reviewed and stable Respiratory status: spontaneous breathing Cardiovascular status: blood pressure returned to baseline Anesthetic complications: no     Last Vitals:  Vitals:   09/21/16 1414 09/21/16 1424  BP: (!) 114/98 116/83  Pulse:    Resp:    Temp:    SpO2:      Last Pain:  Vitals:   09/21/16 1354  TempSrc: Tympanic                 Benen Weida

## 2016-09-21 NOTE — Op Note (Signed)
[facility] Gastroenterology Patient Name: Devin Landry Procedure Date: 09/21/2016 1:32 PM MRN: [id number] Account #: [id number] Date of Birth: 1940-09-01 Admit Type: Outpatient Age: 76 Room: [facility] ENDO ROOM 3 Gender: Male Note Status: Finalized Procedure:            Colonoscopy Indications:          High risk colon cancer surveillance: Personal history                        of colonic polyps Providers:            Manya Silvas, MD Referring MD:         Hewitt Blade. Sarina Ser, MD (Referring MD) Medicines:            Propofol per Anesthesia Complications:        No immediate complications. Procedure:            Pre-Anesthesia Assessment:                       - After reviewing the risks and benefits, the patient                        was deemed in satisfactory condition to undergo the                        procedure.                       After obtaining informed consent, the colonoscope was                        passed under direct vision. Throughout the procedure,                        the patient's blood pressure, pulse, and oxygen                        saturations were monitored continuously. The                        Colonoscope was introduced through the anus and                        advanced to the the cecum, identified by appendiceal                        orifice and ileocecal valve. The colonoscopy was                        performed without difficulty. The patient tolerated the                        procedure well. The quality of the bowel preparation                        was excellent. Findings:      A few small-mouthed diverticula were found in the recto-sigmoid colon       and sigmoid colon.      Internal hemorrhoids were found during endoscopy. The hemorrhoids were       small and Grade I (internal hemorrhoids that do  not prolapse).      The exam was otherwise without abnormality. Impression:           - Diverticulosis in the  recto-sigmoid colon and in the                        sigmoid colon.                       - Internal hemorrhoids.                       - The examination was otherwise normal.                       - No specimens collected. Recommendation:       - Repeat colonoscopy in 5 years for surveillance. Manya Silvas, MD 09/21/2016 1:52:53 PM This report has been signed electronically. Number of Addenda: 0 Note Initiated On: 09/21/2016 1:32 PM Scope Withdrawal Time: 0 hours 9 minutes 52 seconds  Total Procedure Duration: 0 hours 13 minutes 22 seconds       [facility]

## 2016-09-21 NOTE — Transfer of Care (Signed)
Immediate Anesthesia Transfer of Care Note  Patient: Devin Landry  Procedure(s) Performed: Procedure(s): COLONOSCOPY WITH PROPOFOL (N/A)  Patient Location: PACU and Endoscopy Unit  Anesthesia Type:General  Level of Consciousness: sedated and patient cooperative  Airway & Oxygen Therapy: Patient Spontanous Breathing and Patient connected to nasal cannula oxygen  Post-op Assessment: Report given to RN, Post -op Vital signs reviewed and stable and Patient moving all extremities  Post vital signs: Reviewed and stable  Last Vitals:  Vitals:   09/21/16 1232  BP: 136/83  Pulse: 84  Resp: 20  Temp: (!) 36.1 C  SpO2: 96%    Last Pain:  Vitals:   09/21/16 1232  TempSrc: Oral      Patients Stated Pain Goal: 0 (38/88/75 7972)  Complications: No apparent anesthesia complications

## 2016-09-22 ENCOUNTER — Encounter: Payer: Self-pay | Admitting: Unknown Physician Specialty

## 2016-10-25 DIAGNOSIS — D2272 Melanocytic nevi of left lower limb, including hip: Secondary | ICD-10-CM | POA: Diagnosis not present

## 2016-10-25 DIAGNOSIS — D225 Melanocytic nevi of trunk: Secondary | ICD-10-CM | POA: Diagnosis not present

## 2016-10-25 DIAGNOSIS — L820 Inflamed seborrheic keratosis: Secondary | ICD-10-CM | POA: Diagnosis not present

## 2016-10-25 DIAGNOSIS — L538 Other specified erythematous conditions: Secondary | ICD-10-CM | POA: Diagnosis not present

## 2016-10-25 DIAGNOSIS — D2261 Melanocytic nevi of right upper limb, including shoulder: Secondary | ICD-10-CM | POA: Diagnosis not present

## 2016-10-25 DIAGNOSIS — Z85828 Personal history of other malignant neoplasm of skin: Secondary | ICD-10-CM | POA: Diagnosis not present

## 2017-02-09 DIAGNOSIS — H5213 Myopia, bilateral: Secondary | ICD-10-CM | POA: Diagnosis not present

## 2017-04-25 DIAGNOSIS — Z08 Encounter for follow-up examination after completed treatment for malignant neoplasm: Secondary | ICD-10-CM | POA: Diagnosis not present

## 2017-04-25 DIAGNOSIS — L570 Actinic keratosis: Secondary | ICD-10-CM | POA: Diagnosis not present

## 2017-04-25 DIAGNOSIS — D2272 Melanocytic nevi of left lower limb, including hip: Secondary | ICD-10-CM | POA: Diagnosis not present

## 2017-04-25 DIAGNOSIS — D2271 Melanocytic nevi of right lower limb, including hip: Secondary | ICD-10-CM | POA: Diagnosis not present

## 2017-04-25 DIAGNOSIS — X32XXXA Exposure to sunlight, initial encounter: Secondary | ICD-10-CM | POA: Diagnosis not present

## 2017-04-25 DIAGNOSIS — D225 Melanocytic nevi of trunk: Secondary | ICD-10-CM | POA: Diagnosis not present

## 2017-04-25 DIAGNOSIS — Z85828 Personal history of other malignant neoplasm of skin: Secondary | ICD-10-CM | POA: Diagnosis not present

## 2017-04-25 DIAGNOSIS — D2261 Melanocytic nevi of right upper limb, including shoulder: Secondary | ICD-10-CM | POA: Diagnosis not present

## 2017-04-25 DIAGNOSIS — D2262 Melanocytic nevi of left upper limb, including shoulder: Secondary | ICD-10-CM | POA: Diagnosis not present

## 2017-05-16 DIAGNOSIS — S46211A Strain of muscle, fascia and tendon of other parts of biceps, right arm, initial encounter: Secondary | ICD-10-CM | POA: Diagnosis not present

## 2017-05-23 DIAGNOSIS — M25511 Pain in right shoulder: Secondary | ICD-10-CM | POA: Insufficient documentation

## 2017-05-23 DIAGNOSIS — S46111A Strain of muscle, fascia and tendon of long head of biceps, right arm, initial encounter: Secondary | ICD-10-CM | POA: Diagnosis not present

## 2017-07-24 DIAGNOSIS — N400 Enlarged prostate without lower urinary tract symptoms: Secondary | ICD-10-CM | POA: Diagnosis not present

## 2017-07-24 DIAGNOSIS — G8929 Other chronic pain: Secondary | ICD-10-CM | POA: Insufficient documentation

## 2017-07-24 DIAGNOSIS — E7800 Pure hypercholesterolemia, unspecified: Secondary | ICD-10-CM | POA: Diagnosis not present

## 2017-07-24 DIAGNOSIS — Z86010 Personal history of colonic polyps: Secondary | ICD-10-CM | POA: Diagnosis not present

## 2017-07-24 DIAGNOSIS — J45909 Unspecified asthma, uncomplicated: Secondary | ICD-10-CM | POA: Diagnosis not present

## 2017-07-24 DIAGNOSIS — R7303 Prediabetes: Secondary | ICD-10-CM | POA: Diagnosis not present

## 2017-07-24 DIAGNOSIS — N200 Calculus of kidney: Secondary | ICD-10-CM | POA: Diagnosis not present

## 2017-09-13 ENCOUNTER — Ambulatory Visit
Admission: RE | Admit: 2017-09-13 | Discharge: 2017-09-13 | Disposition: A | Discharge status: Home / Self Care | Payer: Medicare HMO | Source: Ambulatory Visit | Attending: Urology | Admitting: Urology

## 2017-09-13 ENCOUNTER — Ambulatory Visit: Payer: Medicare HMO | Admitting: Urology

## 2017-09-13 ENCOUNTER — Encounter: Payer: Self-pay | Admitting: Urology

## 2017-09-13 VITALS — BP 130/84 | HR 91 | Ht 72.0 in | Wt 211.4 lb

## 2017-09-13 DIAGNOSIS — N200 Calculus of kidney: Secondary | ICD-10-CM | POA: Diagnosis not present

## 2017-09-13 DIAGNOSIS — N401 Enlarged prostate with lower urinary tract symptoms: Secondary | ICD-10-CM

## 2017-09-13 NOTE — Progress Notes (Signed)
09/13/2017 2:22 PM   Devin Landry 04-04-1940 [id number]   Chief Complaint  Patient presents with  . Establish Care   Urologic history: 1.  Elevated PSA -Prostate biopsy 2007 benign -Repeat biopsy March 2010 PSA 7.4; volume 54 g; high-grade PIN  2.  BPH with lower urinary tract symptoms -On finasteride  3.  Nephrolithiasis -Small bilateral renal calculi -Ureteroscopic stone removal 9729   HPI: 77 year old male presents to establish local urologic care.  He is a long-standing patient of Dr. Jacqlyn Larsen when he was in [place] and most recently at [facility].  He last saw him in March 2018.  He has a history of an elevated PSA.  He was started on finasteride after his last biopsy in his uncorrected PSA has been in the low 1 range.  It was 1.39 in March 2018.  He does note some erectile dysfunction on finasteride and was inquiring about alternative medications.  His wife has been in poor health and he is currently not sexually active however.  He has bilateral renal calculi and KUB last year showed his largest calculus at 6 mm.  He presently denies flank pain.   PMH: Past Medical History:  Diagnosis Date  . Allergic state   . Arthritis   . Asthma   . BPH (benign prostatic hyperplasia)   . Cataracts, bilateral   . Hemorrhoids   . History of chicken pox   . History of colon polyps   . History of kidney stones   . Hypogonadism in male   . Nephrolithiasis     Surgical History: Past Surgical History:  Procedure Laterality Date  . COLONOSCOPY WITH PROPOFOL N/A 09/21/2016   Procedure: COLONOSCOPY WITH PROPOFOL;  Surgeon: Manya Silvas, MD;  Location: [facility] ENDOSCOPY;  Service: Endoscopy;  Laterality: N/A;  . EYE SURGERY    . FRACTURE SURGERY Left    ORIF DISTAL RADIUS FRACTURE AND PERTROCHANTERIC FEMUR FRACTURE  . HERNIA REPAIR    . KNEE SURGERY      Home Medications:  Allergies as of 09/13/2017      Reactions   Food Color Red Anaphylaxis   Other Anaphylaxis   Certain  Red Apples   Soy Allergy    Other reaction(s): Headache   Cetylpyridinium    Other reaction(s): Unknown Other reaction(s): Unknown   Statins    Other reaction(s): Unknown Other reaction(s): Unknown      Medication List        Accurate as of 09/13/17  2:22 PM. Always use your most recent med list.          acetaminophen 500 MG tablet Commonly known as:  TYLENOL Take 500 mg by mouth every 4 (four) hours as needed.   aspirin EC 81 MG tablet Take 81 mg by mouth daily.   Calcium Carbonate-Vitamin D 600-200 MG-UNIT Caps Take by mouth.   cholestyramine 4 GM/DOSE powder Commonly known as:  QUESTRAN Take 4 g by mouth daily.   diphenhydrAMINE 25 MG tablet Commonly known as:  BENADRYL Take 25 mg by mouth at bedtime as needed.   finasteride 5 MG tablet Commonly known as:  PROSCAR Take 5 mg by mouth daily.   GLUCOSAMINE 1500 COMPLEX PO Take 1,500 mg by mouth 2 (two) times daily.   Glucosamine HCl 1500 MG Tabs Take by mouth.   multivitamin tablet Take 1 tablet by mouth daily.   naproxen 500 MG tablet Commonly known as:  NAPROSYN Take by mouth.   naproxen sodium 220 MG tablet Commonly  known as:  ALEVE Take 440 mg by mouth 2 (two) times daily with a meal.   polyethylene glycol-electrolytes 420 g solution Commonly known as:  NuLYTELY/GoLYTELY Take 4,000 mLs by mouth once.   rosuvastatin 5 MG tablet Commonly known as:  CRESTOR Take 5 mg by mouth daily.   sildenafil 20 MG tablet Commonly known as:  REVATIO 3-5 po tablets daily as needed   tizanidine 2 MG capsule Commonly known as:  ZANAFLEX Take 2 mg by mouth 3 (three) times daily.   TURMERIC PO Take 500 mg by mouth daily.   Ubiquinol 100 MG Caps Take by mouth.   Vitamin B-12 5000 MCG Subl Place under the tongue.   vitamin C 1000 MG tablet Take 1,000 mg by mouth daily.   Vitamin D-3 1000 units Caps Take 1,000 Units by mouth daily.       Allergies:  Allergies  Allergen Reactions  . Food Color  Red Anaphylaxis  . Other Anaphylaxis    Certain Red Apples  . Soy Allergy     Other reaction(s): Headache  . Cetylpyridinium     Other reaction(s): Unknown Other reaction(s): Unknown   . Statins     Other reaction(s): Unknown Other reaction(s): Unknown     Family History: No family history on file.  Social History:  reports that he quit smoking about 39 years ago. His smoking use included pipe. He quit after 20.00 years of use. He has never used smokeless tobacco. He reports that he does not drink alcohol or use drugs.  ROS: UROLOGY Frequent Urination?: No Hard to postpone urination?: No Burning/pain with urination?: No Get up at night to urinate?: Yes Leakage of urine?: No Urine stream starts and stops?: No Trouble starting stream?: No Do you have to strain to urinate?: No Blood in urine?: No Urinary tract infection?: No Sexually transmitted disease?: No Injury to kidneys or bladder?: No Painful intercourse?: No Weak stream?: No Erection problems?: Yes Penile pain?: No  Gastrointestinal Nausea?: No Vomiting?: No Indigestion/heartburn?: No Diarrhea?: No Constipation?: No  Constitutional Fever: No Night sweats?: No Weight loss?: No Fatigue?: No  Skin Skin rash/lesions?: No Itching?: No  Eyes Blurred vision?: No Double vision?: No  Ears/Nose/Throat Sore throat?: No Sinus problems?: No  Hematologic/Lymphatic Swollen glands?: No Easy bruising?: Yes  Cardiovascular Leg swelling?: No Chest pain?: No  Respiratory Cough?: No Shortness of breath?: No  Endocrine Excessive thirst?: No  Musculoskeletal Back pain?: No Joint pain?: Yes  Neurological Headaches?: No Dizziness?: No  Psychologic Depression?: No Anxiety?: No  Physical Exam: BP 130/84 (BP Location: Left Arm, Patient Position: Sitting, Cuff Size: Large)   Pulse 91   Ht 6' (1.829 m)   Wt 211 lb 6.4 oz (95.9 kg)   BMI 28.67 kg/m   Constitutional:  Alert and oriented, No acute  distress. HEENT: [place] AT, moist mucus membranes.  Trachea midline, no masses. Cardiovascular: No clubbing, cyanosis, or edema. Respiratory: Normal respiratory effort, no increased work of breathing. GI: Abdomen is soft, nontender, nondistended, no abdominal masses GU: No CVA tenderness.  Prostate 40 g, smooth without nodules Lymph: No cervical or inguinal lymphadenopathy. Skin: No rashes, bruises or suspicious lesions. Neurologic: Grossly intact, no focal deficits, moving all 4 extremities. Psychiatric: Normal mood and affect.   Assessment & Plan:    1. Benign prostatic hyperplasia with lower urinary tract symptoms, symptom details unspecified Since his last visit with Dr. Jacqlyn Larsen he has had increased urinary frequency and nocturia x2-3.  I discussed either adding tamsulosin  or starting tamsulosin and discontinuing the finasteride.  He has initially elected combination therapy.  Rx tamsulosin was sent to his pharmacy along with a refill of finasteride.  2.  History elevated PSA with high-grade PIN Benign DRE.  PSA was ordered and he will be notified with the results  3.  Bilateral nephrolithiasis KUB was ordered.  Return in about 1 year (around 09/14/2018) for Recheck.  Abbie Sons, [place] [address] [place], [place] [address] [phone]

## 2017-09-14 ENCOUNTER — Encounter: Payer: Self-pay | Admitting: Urology

## 2017-09-14 LAB — PSA: Prostate Specific Ag, Serum: 2 ng/mL (ref 0.0–4.0)

## 2017-09-14 MED ORDER — TAMSULOSIN HCL 0.4 MG PO CAPS: 0.4000 mg | ORAL_CAPSULE | Freq: Every day | ORAL | 3 refills | Status: DC

## 2017-09-14 MED ORDER — FINASTERIDE 5 MG PO TABS: 5.0000 mg | ORAL_TABLET | Freq: Every day | ORAL | 3 refills | Status: DC

## 2018-01-18 DIAGNOSIS — N400 Enlarged prostate without lower urinary tract symptoms: Secondary | ICD-10-CM | POA: Diagnosis not present

## 2018-01-18 DIAGNOSIS — R7303 Prediabetes: Secondary | ICD-10-CM | POA: Diagnosis not present

## 2018-01-18 DIAGNOSIS — E7800 Pure hypercholesterolemia, unspecified: Secondary | ICD-10-CM | POA: Diagnosis not present

## 2018-01-19 DIAGNOSIS — E7800 Pure hypercholesterolemia, unspecified: Secondary | ICD-10-CM | POA: Diagnosis not present

## 2018-01-19 DIAGNOSIS — N400 Enlarged prostate without lower urinary tract symptoms: Secondary | ICD-10-CM | POA: Diagnosis not present

## 2018-01-19 DIAGNOSIS — R7303 Prediabetes: Secondary | ICD-10-CM | POA: Diagnosis not present

## 2018-01-25 DIAGNOSIS — Z0001 Encounter for general adult medical examination with abnormal findings: Secondary | ICD-10-CM | POA: Diagnosis not present

## 2018-01-25 DIAGNOSIS — E7800 Pure hypercholesterolemia, unspecified: Secondary | ICD-10-CM | POA: Diagnosis not present

## 2018-01-25 DIAGNOSIS — J45909 Unspecified asthma, uncomplicated: Secondary | ICD-10-CM | POA: Diagnosis not present

## 2018-01-25 DIAGNOSIS — N400 Enlarged prostate without lower urinary tract symptoms: Secondary | ICD-10-CM | POA: Diagnosis not present

## 2018-01-30 DIAGNOSIS — L570 Actinic keratosis: Secondary | ICD-10-CM | POA: Diagnosis not present

## 2018-01-30 DIAGNOSIS — D2262 Melanocytic nevi of left upper limb, including shoulder: Secondary | ICD-10-CM | POA: Diagnosis not present

## 2018-01-30 DIAGNOSIS — D2271 Melanocytic nevi of right lower limb, including hip: Secondary | ICD-10-CM | POA: Diagnosis not present

## 2018-01-30 DIAGNOSIS — L538 Other specified erythematous conditions: Secondary | ICD-10-CM | POA: Diagnosis not present

## 2018-01-30 DIAGNOSIS — D225 Melanocytic nevi of trunk: Secondary | ICD-10-CM | POA: Diagnosis not present

## 2018-01-30 DIAGNOSIS — L820 Inflamed seborrheic keratosis: Secondary | ICD-10-CM | POA: Diagnosis not present

## 2018-01-30 DIAGNOSIS — Z85828 Personal history of other malignant neoplasm of skin: Secondary | ICD-10-CM | POA: Diagnosis not present

## 2018-01-30 DIAGNOSIS — D2272 Melanocytic nevi of left lower limb, including hip: Secondary | ICD-10-CM | POA: Diagnosis not present

## 2018-01-30 DIAGNOSIS — Z08 Encounter for follow-up examination after completed treatment for malignant neoplasm: Secondary | ICD-10-CM | POA: Diagnosis not present

## 2018-01-30 DIAGNOSIS — D2261 Melanocytic nevi of right upper limb, including shoulder: Secondary | ICD-10-CM | POA: Diagnosis not present

## 2018-01-30 DIAGNOSIS — C44612 Basal cell carcinoma of skin of right upper limb, including shoulder: Secondary | ICD-10-CM | POA: Diagnosis not present

## 2018-02-12 DIAGNOSIS — H524 Presbyopia: Secondary | ICD-10-CM | POA: Diagnosis not present

## 2018-03-05 DIAGNOSIS — X32XXXA Exposure to sunlight, initial encounter: Secondary | ICD-10-CM | POA: Diagnosis not present

## 2018-03-05 DIAGNOSIS — C44612 Basal cell carcinoma of skin of right upper limb, including shoulder: Secondary | ICD-10-CM | POA: Diagnosis not present

## 2018-03-05 DIAGNOSIS — L570 Actinic keratosis: Secondary | ICD-10-CM | POA: Diagnosis not present

## 2018-06-18 ENCOUNTER — Other Ambulatory Visit: Payer: Self-pay

## 2018-06-18 DIAGNOSIS — N401 Enlarged prostate with lower urinary tract symptoms: Secondary | ICD-10-CM

## 2018-06-18 MED ORDER — FINASTERIDE 5 MG PO TABS: 5.0000 mg | ORAL_TABLET | Freq: Every day | ORAL | 0 refills | Status: DC

## 2018-06-18 MED ORDER — TAMSULOSIN HCL 0.4 MG PO CAPS: 0.4000 mg | ORAL_CAPSULE | Freq: Every day | ORAL | 0 refills | Status: DC

## 2018-07-10 DIAGNOSIS — D2272 Melanocytic nevi of left lower limb, including hip: Secondary | ICD-10-CM | POA: Diagnosis not present

## 2018-07-10 DIAGNOSIS — D2261 Melanocytic nevi of right upper limb, including shoulder: Secondary | ICD-10-CM | POA: Diagnosis not present

## 2018-07-10 DIAGNOSIS — D2262 Melanocytic nevi of left upper limb, including shoulder: Secondary | ICD-10-CM | POA: Diagnosis not present

## 2018-07-10 DIAGNOSIS — X32XXXA Exposure to sunlight, initial encounter: Secondary | ICD-10-CM | POA: Diagnosis not present

## 2018-07-10 DIAGNOSIS — L570 Actinic keratosis: Secondary | ICD-10-CM | POA: Diagnosis not present

## 2018-07-10 DIAGNOSIS — D225 Melanocytic nevi of trunk: Secondary | ICD-10-CM | POA: Diagnosis not present

## 2018-07-10 DIAGNOSIS — D2271 Melanocytic nevi of right lower limb, including hip: Secondary | ICD-10-CM | POA: Diagnosis not present

## 2018-07-10 DIAGNOSIS — Z85828 Personal history of other malignant neoplasm of skin: Secondary | ICD-10-CM | POA: Diagnosis not present

## 2018-07-10 DIAGNOSIS — Z08 Encounter for follow-up examination after completed treatment for malignant neoplasm: Secondary | ICD-10-CM | POA: Diagnosis not present

## 2018-07-19 DIAGNOSIS — J45909 Unspecified asthma, uncomplicated: Secondary | ICD-10-CM | POA: Diagnosis not present

## 2018-07-26 DIAGNOSIS — R7303 Prediabetes: Secondary | ICD-10-CM | POA: Diagnosis not present

## 2018-07-26 DIAGNOSIS — G8929 Other chronic pain: Secondary | ICD-10-CM | POA: Diagnosis not present

## 2018-07-26 DIAGNOSIS — E7800 Pure hypercholesterolemia, unspecified: Secondary | ICD-10-CM | POA: Diagnosis not present

## 2018-07-26 DIAGNOSIS — N400 Enlarged prostate without lower urinary tract symptoms: Secondary | ICD-10-CM | POA: Diagnosis not present

## 2018-07-26 DIAGNOSIS — J45909 Unspecified asthma, uncomplicated: Secondary | ICD-10-CM | POA: Diagnosis not present

## 2018-07-26 DIAGNOSIS — Z0000 Encounter for general adult medical examination without abnormal findings: Secondary | ICD-10-CM | POA: Diagnosis not present

## 2018-08-20 ENCOUNTER — Encounter: Payer: Self-pay | Admitting: Urology

## 2018-08-30 ENCOUNTER — Other Ambulatory Visit: Payer: Self-pay | Admitting: Family Medicine

## 2018-08-30 DIAGNOSIS — N401 Enlarged prostate with lower urinary tract symptoms: Secondary | ICD-10-CM

## 2018-08-30 MED ORDER — FINASTERIDE 5 MG PO TABS: 5.0000 mg | ORAL_TABLET | Freq: Every day | ORAL | 0 refills | Status: DC

## 2018-09-03 ENCOUNTER — Other Ambulatory Visit: Payer: Self-pay | Admitting: *Deleted

## 2018-09-03 DIAGNOSIS — N401 Enlarged prostate with lower urinary tract symptoms: Secondary | ICD-10-CM

## 2018-09-03 MED ORDER — TAMSULOSIN HCL 0.4 MG PO CAPS: 0.4000 mg | ORAL_CAPSULE | Freq: Every day | ORAL | 0 refills | Status: DC

## 2018-09-18 ENCOUNTER — Ambulatory Visit: Payer: Medicare HMO | Admitting: Urology

## 2018-09-19 ENCOUNTER — Other Ambulatory Visit: Payer: Self-pay

## 2018-09-19 ENCOUNTER — Ambulatory Visit (INDEPENDENT_AMBULATORY_CARE_PROVIDER_SITE_OTHER): Payer: Medicare HMO | Admitting: Urology

## 2018-09-19 ENCOUNTER — Encounter: Payer: Self-pay | Admitting: Urology

## 2018-09-19 VITALS — BP 164/84 | HR 93 | Ht 72.0 in | Wt 213.2 lb

## 2018-09-19 DIAGNOSIS — Z87898 Personal history of other specified conditions: Secondary | ICD-10-CM | POA: Diagnosis not present

## 2018-09-19 DIAGNOSIS — N200 Calculus of kidney: Secondary | ICD-10-CM | POA: Diagnosis not present

## 2018-09-19 DIAGNOSIS — N401 Enlarged prostate with lower urinary tract symptoms: Secondary | ICD-10-CM | POA: Diagnosis not present

## 2018-09-19 DIAGNOSIS — N138 Other obstructive and reflux uropathy: Secondary | ICD-10-CM

## 2018-09-19 DIAGNOSIS — K635 Polyp of colon: Secondary | ICD-10-CM | POA: Insufficient documentation

## 2018-09-19 NOTE — Progress Notes (Signed)
09/19/2018 10:31 AM   Devin Landry June 22, 1940 [id number]  Referring provider: Baxter Hire, MD [place],  [place] [address]  Chief Complaint  Patient presents with  . Benign Prostatic Hypertrophy    Urologic history: 1.  Elevated PSA -Prostate biopsy 2007 benign -Repeat biopsy March 2010 PSA 7.4; volume 54 g; high-grade PIN  2.  BPH with lower urinary tract symptoms -On finasteride/tamsulosin  3.  Nephrolithiasis -Small bilateral renal calculi -Ureteroscopic stone removal 2014   HPI: 78 y.o. male presents for annual follow-up.  At his visit last year he had noted increased urinary frequency and nocturia x2-3.  Tamsulosin was added and he has noted significant improvement in his voiding pattern.  He notes occasional double voiding.  He has had stable, bilateral renal calculi for several years.  He notes occasional twinges of flank pain but no colic.  Denies dysuria or gross hematuria.  He has ED however states his wife has medical issues and currently not sexually active.   PMH: Past Medical History:  Diagnosis Date  . Allergic state   . Arthritis   . Asthma   . BPH (benign prostatic hyperplasia)   . Cataracts, bilateral   . Hemorrhoids   . History of chicken pox   . History of colon polyps   . History of kidney stones   . Hypogonadism in male   . Nephrolithiasis     Surgical History: Past Surgical History:  Procedure Laterality Date  . COLONOSCOPY WITH PROPOFOL N/A 09/21/2016   Procedure: COLONOSCOPY WITH PROPOFOL;  Surgeon: Manya Silvas, MD;  Location: [facility] ENDOSCOPY;  Service: Endoscopy;  Laterality: N/A;  . EYE SURGERY    . FRACTURE SURGERY Left    ORIF DISTAL RADIUS FRACTURE AND PERTROCHANTERIC FEMUR FRACTURE  . HERNIA REPAIR    . KNEE SURGERY      Home Medications:  Allergies as of 09/19/2018      Reactions   Food Color Red Anaphylaxis   Other Anaphylaxis   Certain Red Apples   Soy Allergy    Other reaction(s):  Headache   Cetylpyridinium    Other reaction(s): Unknown Other reaction(s): Unknown   Statins    Other reaction(s): Unknown Other reaction(s): Unknown      Medication List       Accurate as of September 19, 2018 10:31 AM. If you have any questions, ask your nurse or doctor.        STOP taking these medications   Calcium Carbonate-Vitamin D 600-200 MG-UNIT Caps Stopped by: Abbie Sons, MD   Glucosamine HCl 1500 MG Tabs Stopped by: Abbie Sons, MD   Ubiquinol 100 MG Caps Stopped by: Abbie Sons, MD     TAKE these medications   acetaminophen 500 MG tablet Commonly known as: TYLENOL Take 500 mg by mouth as needed (Per pt 500-650mg  once daily as needed).   acetaminophen-codeine 300-30 MG tablet Commonly known as: TYLENOL #3   Aspirin Buf(CaCarb-MgCarb-MgO) 81 MG Tabs aspirin 81 mg tablet  Take by oral route.   aspirin EC 81 MG tablet Take 81 mg by mouth daily.   CALCIUM CITRATE + D PO Take by mouth 2 (two) times daily. 600-200mg    cholestyramine 4 GM/DOSE powder Commonly known as: QUESTRAN Take 4 g by mouth daily.   diphenhydrAMINE 25 MG tablet Commonly known as: BENADRYL Take 25 mg by mouth at bedtime as needed.   finasteride 5 MG tablet Commonly known as: PROSCAR Take 1 tablet (5  mg total) by mouth daily.   GLUCOSAMINE 1500 COMPLEX PO Take 1,500 mg by mouth 2 (two) times daily.   multivitamin tablet Take 1 tablet by mouth daily.   naproxen 500 MG tablet Commonly known as: NAPROSYN Take by mouth.   naproxen sodium 220 MG tablet Commonly known as: ALEVE Take 220 mg by mouth daily as needed.   polyethylene glycol-electrolytes 420 g solution Commonly known as: NuLYTELY/GoLYTELY Take 4,000 mLs by mouth once.   rosuvastatin 5 MG tablet Commonly known as: CRESTOR Take 5 mg by mouth daily.   sildenafil 20 MG tablet Commonly known as: REVATIO 3-5 po tablets daily as needed   tamsulosin 0.4 MG Caps capsule Commonly known as: FLOMAX  Take 1 capsule (0.4 mg total) by mouth daily.   tizanidine 2 MG capsule Commonly known as: ZANAFLEX Take 2 mg by mouth 3 (three) times daily. Per pt takes as needed   TURMERIC PO Take 500 mg by mouth daily.   Vitamin B-12 5000 MCG Subl Place under the tongue.   vitamin C 1000 MG tablet Take 1,000 mg by mouth daily.   Vitamin D-3 25 MCG (1000 UT) Caps Take 1,000 Units by mouth daily.       Allergies:  Allergies  Allergen Reactions  . Food Color Red Anaphylaxis  . Other Anaphylaxis    Certain Red Apples  . Soy Allergy     Other reaction(s): Headache  . Cetylpyridinium     Other reaction(s): Unknown Other reaction(s): Unknown   . Statins     Other reaction(s): Unknown Other reaction(s): Unknown     Family History: No family history on file.  Social History:  reports that he quit smoking about 40 years ago. His smoking use included pipe. He quit after 20.00 years of use. He has never used smokeless tobacco. He reports that he does not drink alcohol or use drugs.  ROS: UROLOGY Frequent Urination?: No Hard to postpone urination?: No Burning/pain with urination?: No Get up at night to urinate?: No Leakage of urine?: No Urine stream starts and stops?: No Trouble starting stream?: No Do you have to strain to urinate?: No Blood in urine?: No Urinary tract infection?: No Sexually transmitted disease?: No Injury to kidneys or bladder?: No Painful intercourse?: No Weak stream?: No Erection problems?: Yes Penile pain?: No  Gastrointestinal Nausea?: No Vomiting?: No Indigestion/heartburn?: No Diarrhea?: No Constipation?: No  Constitutional Fever: No Night sweats?: No Weight loss?: No Fatigue?: No  Skin Skin rash/lesions?: No Itching?: No  Eyes Blurred vision?: No Double vision?: No  Ears/Nose/Throat Sore throat?: No Sinus problems?: No  Hematologic/Lymphatic Swollen glands?: No Easy bruising?: No  Cardiovascular Leg swelling?: No Chest  pain?: No  Respiratory Cough?: No Shortness of breath?: No  Endocrine Excessive thirst?: No  Musculoskeletal Back pain?: No Joint pain?: No  Neurological Headaches?: No Dizziness?: No  Psychologic Depression?: No Anxiety?: No  Physical Exam: BP (!) 164/84 (BP Location: Left Arm, Patient Position: Sitting, Cuff Size: Normal)   Pulse 93   Ht 6' (1.829 m)   Wt 213 lb 3.2 oz (96.7 kg)   BMI 28.92 kg/m   Constitutional:  Alert and oriented, No acute distress. HEENT: [place] AT, moist mucus membranes.  Trachea midline, no masses. Cardiovascular: No clubbing, cyanosis, or edema. Respiratory: Normal respiratory effort, no increased work of breathing. GI: Abdomen is soft, nontender, nondistended, no abdominal masses GU: Prostate 40 g, smooth without nodules Lymph: No cervical or inguinal lymphadenopathy. Skin: No rashes, bruises or suspicious lesions.  Neurologic: Grossly intact, no focal deficits, moving all 4 extremities. Psychiatric: Normal mood and affect.   Assessment & Plan:    - BPH with lower urinary tract symptoms Stable on tamsulosin/finasteride which were refilled.  Based on age and PSA stability would recommend discontinuing prostate cancer screening.  - Bilateral nephrolithiasis Calculi have been stable for several years.  Will obtain a KUB at his visit next year.   Abbie Sons, [place] [address], [place], [place] [address] [phone]

## 2018-09-21 ENCOUNTER — Encounter: Payer: Self-pay | Admitting: Urology

## 2018-09-21 MED ORDER — FINASTERIDE 5 MG PO TABS: 5.0000 mg | ORAL_TABLET | Freq: Every day | ORAL | 3 refills | Status: DC

## 2018-09-21 MED ORDER — TAMSULOSIN HCL 0.4 MG PO CAPS: 0.4000 mg | ORAL_CAPSULE | Freq: Every day | ORAL | 3 refills | Status: DC

## 2018-10-11 ENCOUNTER — Other Ambulatory Visit: Payer: Self-pay

## 2018-10-11 DIAGNOSIS — Z20828 Contact with and (suspected) exposure to other viral communicable diseases: Secondary | ICD-10-CM | POA: Diagnosis not present

## 2018-10-11 DIAGNOSIS — Z20822 Contact with and (suspected) exposure to covid-19: Secondary | ICD-10-CM

## 2018-10-12 LAB — NOVEL CORONAVIRUS, NAA: SARS-CoV-2, NAA: DETECTED — AB

## 2018-10-26 DIAGNOSIS — M47814 Spondylosis without myelopathy or radiculopathy, thoracic region: Secondary | ICD-10-CM | POA: Diagnosis not present

## 2018-10-26 DIAGNOSIS — U071 COVID-19: Secondary | ICD-10-CM | POA: Diagnosis not present

## 2018-10-26 DIAGNOSIS — Z87891 Personal history of nicotine dependence: Secondary | ICD-10-CM | POA: Diagnosis not present

## 2019-01-25 DIAGNOSIS — J45909 Unspecified asthma, uncomplicated: Secondary | ICD-10-CM | POA: Diagnosis not present

## 2019-01-25 DIAGNOSIS — R7303 Prediabetes: Secondary | ICD-10-CM | POA: Diagnosis not present

## 2019-01-25 DIAGNOSIS — E7800 Pure hypercholesterolemia, unspecified: Secondary | ICD-10-CM | POA: Diagnosis not present

## 2019-02-01 DIAGNOSIS — N400 Enlarged prostate without lower urinary tract symptoms: Secondary | ICD-10-CM | POA: Diagnosis not present

## 2019-02-01 DIAGNOSIS — J45909 Unspecified asthma, uncomplicated: Secondary | ICD-10-CM | POA: Diagnosis not present

## 2019-02-01 DIAGNOSIS — Z0000 Encounter for general adult medical examination without abnormal findings: Secondary | ICD-10-CM | POA: Diagnosis not present

## 2019-02-01 DIAGNOSIS — E7800 Pure hypercholesterolemia, unspecified: Secondary | ICD-10-CM | POA: Diagnosis not present

## 2019-02-01 DIAGNOSIS — Z0001 Encounter for general adult medical examination with abnormal findings: Secondary | ICD-10-CM | POA: Diagnosis not present

## 2019-02-01 DIAGNOSIS — R7303 Prediabetes: Secondary | ICD-10-CM | POA: Diagnosis not present

## 2019-02-26 DIAGNOSIS — H524 Presbyopia: Secondary | ICD-10-CM | POA: Diagnosis not present

## 2019-02-26 DIAGNOSIS — Z0100 Encounter for examination of eyes and vision without abnormal findings: Secondary | ICD-10-CM | POA: Diagnosis not present

## 2019-03-11 DIAGNOSIS — D2261 Melanocytic nevi of right upper limb, including shoulder: Secondary | ICD-10-CM | POA: Diagnosis not present

## 2019-03-11 DIAGNOSIS — R208 Other disturbances of skin sensation: Secondary | ICD-10-CM | POA: Diagnosis not present

## 2019-03-11 DIAGNOSIS — D2272 Melanocytic nevi of left lower limb, including hip: Secondary | ICD-10-CM | POA: Diagnosis not present

## 2019-03-11 DIAGNOSIS — D225 Melanocytic nevi of trunk: Secondary | ICD-10-CM | POA: Diagnosis not present

## 2019-03-11 DIAGNOSIS — L538 Other specified erythematous conditions: Secondary | ICD-10-CM | POA: Diagnosis not present

## 2019-03-11 DIAGNOSIS — D2262 Melanocytic nevi of left upper limb, including shoulder: Secondary | ICD-10-CM | POA: Diagnosis not present

## 2019-03-11 DIAGNOSIS — L570 Actinic keratosis: Secondary | ICD-10-CM | POA: Diagnosis not present

## 2019-03-11 DIAGNOSIS — L820 Inflamed seborrheic keratosis: Secondary | ICD-10-CM | POA: Diagnosis not present

## 2019-03-11 DIAGNOSIS — Z85828 Personal history of other malignant neoplasm of skin: Secondary | ICD-10-CM | POA: Diagnosis not present

## 2019-04-09 DIAGNOSIS — M1611 Unilateral primary osteoarthritis, right hip: Secondary | ICD-10-CM | POA: Diagnosis not present

## 2019-04-09 DIAGNOSIS — Z87891 Personal history of nicotine dependence: Secondary | ICD-10-CM | POA: Diagnosis not present

## 2019-04-09 DIAGNOSIS — M25551 Pain in right hip: Secondary | ICD-10-CM | POA: Diagnosis not present

## 2019-04-18 DIAGNOSIS — R262 Difficulty in walking, not elsewhere classified: Secondary | ICD-10-CM | POA: Diagnosis not present

## 2019-04-18 DIAGNOSIS — M25551 Pain in right hip: Secondary | ICD-10-CM | POA: Diagnosis not present

## 2019-04-22 DIAGNOSIS — R262 Difficulty in walking, not elsewhere classified: Secondary | ICD-10-CM | POA: Diagnosis not present

## 2019-04-22 DIAGNOSIS — M25551 Pain in right hip: Secondary | ICD-10-CM | POA: Diagnosis not present

## 2019-04-29 DIAGNOSIS — M25551 Pain in right hip: Secondary | ICD-10-CM | POA: Diagnosis not present

## 2019-04-29 DIAGNOSIS — R262 Difficulty in walking, not elsewhere classified: Secondary | ICD-10-CM | POA: Diagnosis not present

## 2019-05-02 DIAGNOSIS — M25551 Pain in right hip: Secondary | ICD-10-CM | POA: Diagnosis not present

## 2019-05-02 DIAGNOSIS — R262 Difficulty in walking, not elsewhere classified: Secondary | ICD-10-CM | POA: Diagnosis not present

## 2019-05-10 DIAGNOSIS — R262 Difficulty in walking, not elsewhere classified: Secondary | ICD-10-CM | POA: Diagnosis not present

## 2019-05-10 DIAGNOSIS — M25551 Pain in right hip: Secondary | ICD-10-CM | POA: Diagnosis not present

## 2019-05-14 DIAGNOSIS — M25551 Pain in right hip: Secondary | ICD-10-CM | POA: Diagnosis not present

## 2019-05-14 DIAGNOSIS — R262 Difficulty in walking, not elsewhere classified: Secondary | ICD-10-CM | POA: Diagnosis not present

## 2019-05-16 DIAGNOSIS — R262 Difficulty in walking, not elsewhere classified: Secondary | ICD-10-CM | POA: Diagnosis not present

## 2019-05-16 DIAGNOSIS — M25551 Pain in right hip: Secondary | ICD-10-CM | POA: Diagnosis not present

## 2019-05-21 DIAGNOSIS — R262 Difficulty in walking, not elsewhere classified: Secondary | ICD-10-CM | POA: Diagnosis not present

## 2019-05-21 DIAGNOSIS — M25551 Pain in right hip: Secondary | ICD-10-CM | POA: Diagnosis not present

## 2019-05-24 DIAGNOSIS — M25551 Pain in right hip: Secondary | ICD-10-CM | POA: Diagnosis not present

## 2019-05-24 DIAGNOSIS — R262 Difficulty in walking, not elsewhere classified: Secondary | ICD-10-CM | POA: Diagnosis not present

## 2019-06-04 DIAGNOSIS — R262 Difficulty in walking, not elsewhere classified: Secondary | ICD-10-CM | POA: Diagnosis not present

## 2019-06-04 DIAGNOSIS — M25551 Pain in right hip: Secondary | ICD-10-CM | POA: Diagnosis not present

## 2019-06-07 DIAGNOSIS — R262 Difficulty in walking, not elsewhere classified: Secondary | ICD-10-CM | POA: Diagnosis not present

## 2019-06-07 DIAGNOSIS — M25551 Pain in right hip: Secondary | ICD-10-CM | POA: Diagnosis not present

## 2019-06-13 DIAGNOSIS — M25551 Pain in right hip: Secondary | ICD-10-CM | POA: Diagnosis not present

## 2019-06-13 DIAGNOSIS — R262 Difficulty in walking, not elsewhere classified: Secondary | ICD-10-CM | POA: Diagnosis not present

## 2019-06-17 DIAGNOSIS — R262 Difficulty in walking, not elsewhere classified: Secondary | ICD-10-CM | POA: Diagnosis not present

## 2019-06-17 DIAGNOSIS — M25551 Pain in right hip: Secondary | ICD-10-CM | POA: Diagnosis not present

## 2019-06-26 DIAGNOSIS — M25551 Pain in right hip: Secondary | ICD-10-CM | POA: Diagnosis not present

## 2019-06-26 DIAGNOSIS — R262 Difficulty in walking, not elsewhere classified: Secondary | ICD-10-CM | POA: Diagnosis not present

## 2019-06-28 DIAGNOSIS — M25551 Pain in right hip: Secondary | ICD-10-CM | POA: Diagnosis not present

## 2019-06-28 DIAGNOSIS — R262 Difficulty in walking, not elsewhere classified: Secondary | ICD-10-CM | POA: Diagnosis not present

## 2019-07-01 DIAGNOSIS — R262 Difficulty in walking, not elsewhere classified: Secondary | ICD-10-CM | POA: Diagnosis not present

## 2019-07-01 DIAGNOSIS — M25551 Pain in right hip: Secondary | ICD-10-CM | POA: Diagnosis not present

## 2019-07-05 DIAGNOSIS — M25551 Pain in right hip: Secondary | ICD-10-CM | POA: Diagnosis not present

## 2019-07-05 DIAGNOSIS — R262 Difficulty in walking, not elsewhere classified: Secondary | ICD-10-CM | POA: Diagnosis not present

## 2019-07-09 DIAGNOSIS — M25551 Pain in right hip: Secondary | ICD-10-CM | POA: Diagnosis not present

## 2019-07-09 DIAGNOSIS — R262 Difficulty in walking, not elsewhere classified: Secondary | ICD-10-CM | POA: Diagnosis not present

## 2019-07-12 DIAGNOSIS — R262 Difficulty in walking, not elsewhere classified: Secondary | ICD-10-CM | POA: Diagnosis not present

## 2019-07-12 DIAGNOSIS — M25551 Pain in right hip: Secondary | ICD-10-CM | POA: Diagnosis not present

## 2019-07-22 DIAGNOSIS — M25551 Pain in right hip: Secondary | ICD-10-CM | POA: Diagnosis not present

## 2019-07-22 DIAGNOSIS — R262 Difficulty in walking, not elsewhere classified: Secondary | ICD-10-CM | POA: Diagnosis not present

## 2019-07-24 DIAGNOSIS — R262 Difficulty in walking, not elsewhere classified: Secondary | ICD-10-CM | POA: Diagnosis not present

## 2019-07-24 DIAGNOSIS — M25551 Pain in right hip: Secondary | ICD-10-CM | POA: Diagnosis not present

## 2019-07-25 DIAGNOSIS — R7303 Prediabetes: Secondary | ICD-10-CM | POA: Diagnosis not present

## 2019-07-29 DIAGNOSIS — R262 Difficulty in walking, not elsewhere classified: Secondary | ICD-10-CM | POA: Diagnosis not present

## 2019-07-29 DIAGNOSIS — M25551 Pain in right hip: Secondary | ICD-10-CM | POA: Diagnosis not present

## 2019-07-31 DIAGNOSIS — M25551 Pain in right hip: Secondary | ICD-10-CM | POA: Diagnosis not present

## 2019-07-31 DIAGNOSIS — R262 Difficulty in walking, not elsewhere classified: Secondary | ICD-10-CM | POA: Diagnosis not present

## 2019-08-01 DIAGNOSIS — R7303 Prediabetes: Secondary | ICD-10-CM | POA: Diagnosis not present

## 2019-08-01 DIAGNOSIS — E7800 Pure hypercholesterolemia, unspecified: Secondary | ICD-10-CM | POA: Diagnosis not present

## 2019-08-01 DIAGNOSIS — N400 Enlarged prostate without lower urinary tract symptoms: Secondary | ICD-10-CM | POA: Diagnosis not present

## 2019-08-01 DIAGNOSIS — J45909 Unspecified asthma, uncomplicated: Secondary | ICD-10-CM | POA: Diagnosis not present

## 2019-08-05 DIAGNOSIS — M25551 Pain in right hip: Secondary | ICD-10-CM | POA: Diagnosis not present

## 2019-08-05 DIAGNOSIS — R262 Difficulty in walking, not elsewhere classified: Secondary | ICD-10-CM | POA: Diagnosis not present

## 2019-08-07 DIAGNOSIS — M25551 Pain in right hip: Secondary | ICD-10-CM | POA: Diagnosis not present

## 2019-08-07 DIAGNOSIS — R262 Difficulty in walking, not elsewhere classified: Secondary | ICD-10-CM | POA: Diagnosis not present

## 2019-08-08 DIAGNOSIS — L03032 Cellulitis of left toe: Secondary | ICD-10-CM | POA: Diagnosis not present

## 2019-08-12 DIAGNOSIS — R262 Difficulty in walking, not elsewhere classified: Secondary | ICD-10-CM | POA: Diagnosis not present

## 2019-08-12 DIAGNOSIS — M25551 Pain in right hip: Secondary | ICD-10-CM | POA: Diagnosis not present

## 2019-08-14 DIAGNOSIS — R262 Difficulty in walking, not elsewhere classified: Secondary | ICD-10-CM | POA: Diagnosis not present

## 2019-08-14 DIAGNOSIS — M25551 Pain in right hip: Secondary | ICD-10-CM | POA: Diagnosis not present

## 2019-08-20 DIAGNOSIS — R262 Difficulty in walking, not elsewhere classified: Secondary | ICD-10-CM | POA: Diagnosis not present

## 2019-08-20 DIAGNOSIS — M25551 Pain in right hip: Secondary | ICD-10-CM | POA: Diagnosis not present

## 2019-08-21 DIAGNOSIS — M25551 Pain in right hip: Secondary | ICD-10-CM | POA: Diagnosis not present

## 2019-08-21 DIAGNOSIS — R262 Difficulty in walking, not elsewhere classified: Secondary | ICD-10-CM | POA: Diagnosis not present

## 2019-08-22 DIAGNOSIS — L03032 Cellulitis of left toe: Secondary | ICD-10-CM | POA: Diagnosis not present

## 2019-09-20 ENCOUNTER — Ambulatory Visit: Payer: Medicare HMO | Admitting: Urology

## 2019-09-24 ENCOUNTER — Other Ambulatory Visit: Payer: Self-pay | Admitting: *Deleted

## 2019-09-24 DIAGNOSIS — N200 Calculus of kidney: Secondary | ICD-10-CM

## 2019-09-25 ENCOUNTER — Ambulatory Visit
Admission: RE | Admit: 2019-09-25 | Discharge: 2019-09-25 | Disposition: A | Discharge status: Home / Self Care | Payer: Medicare HMO | Source: Ambulatory Visit | Attending: Urology | Admitting: Urology

## 2019-09-25 ENCOUNTER — Other Ambulatory Visit: Payer: Self-pay

## 2019-09-25 ENCOUNTER — Encounter: Payer: Self-pay | Admitting: Urology

## 2019-09-25 ENCOUNTER — Ambulatory Visit: Payer: Medicare HMO | Admitting: Urology

## 2019-09-25 VITALS — BP 126/82 | HR 88 | Ht 72.0 in | Wt 207.0 lb

## 2019-09-25 DIAGNOSIS — N200 Calculus of kidney: Secondary | ICD-10-CM

## 2019-09-25 DIAGNOSIS — N529 Male erectile dysfunction, unspecified: Secondary | ICD-10-CM

## 2019-09-25 DIAGNOSIS — N401 Enlarged prostate with lower urinary tract symptoms: Secondary | ICD-10-CM | POA: Diagnosis not present

## 2019-09-25 DIAGNOSIS — Z87898 Personal history of other specified conditions: Secondary | ICD-10-CM

## 2019-09-25 MED ORDER — TAMSULOSIN HCL 0.4 MG PO CAPS: 0.4000 mg | ORAL_CAPSULE | Freq: Every day | ORAL | 3 refills | Status: DC

## 2019-09-25 MED ORDER — FINASTERIDE 5 MG PO TABS: 5.0000 mg | ORAL_TABLET | Freq: Every day | ORAL | 3 refills | Status: DC

## 2019-09-25 NOTE — Progress Notes (Signed)
09/25/2019 11:05 AM   Devin Landry 04-Nov-1940 [id number]  Referring provider: Baxter Hire, MD [place],  [place] [address]  Chief Complaint  Patient presents with  . Nephrolithiasis    Urologic history: 1.Elevated PSA -Prostate biopsy 2007 benign -Repeat biopsy March 2010 PSA 7.4;volume 54 g;high-grade PIN  2.BPH with lower urinary tract symptoms -On finasteride/tamsulosin  3.Nephrolithiasis -Small bilateral renal calculi -Ureteroscopic stone removal 2014   HPI: 79 y.o. male presents for annual follow-up.   Stable LUTS on tamsulosin/finasteride  Has noted increased postvoid dribbling  Nocturia x2 which he relates to fluids with nighttime meds  Denies dysuria, gross hematuria  No flank, abdominal or pelvic pain  + ED however wife has medical problems and is not sexually active  Denies renal colic   PMH: Past Medical History:  Diagnosis Date  . Allergic state   . Arthritis   . Asthma   . BPH (benign prostatic hyperplasia)   . Cataracts, bilateral   . Hemorrhoids   . History of chicken pox   . History of colon polyps   . History of kidney stones   . Hypogonadism in male   . Nephrolithiasis     Surgical History: Past Surgical History:  Procedure Laterality Date  . COLONOSCOPY WITH PROPOFOL N/A 09/21/2016   Procedure: COLONOSCOPY WITH PROPOFOL;  Surgeon: Manya Silvas, MD;  Location: [facility] ENDOSCOPY;  Service: Endoscopy;  Laterality: N/A;  . EYE SURGERY    . FRACTURE SURGERY Left    ORIF DISTAL RADIUS FRACTURE AND PERTROCHANTERIC FEMUR FRACTURE  . HERNIA REPAIR    . KNEE SURGERY      Home Medications:  Allergies as of 09/25/2019      Reactions   Food Color Red Anaphylaxis   Other Anaphylaxis   Certain Red Apples   Soy Allergy    Other reaction(s): Headache   Cetylpyridinium    Other reaction(s): Unknown Other reaction(s): Unknown   Statins    Other reaction(s): Unknown Other reaction(s): Unknown        Medication List       Accurate as of September 25, 2019 11:05 AM. If you have any questions, ask your nurse or doctor.        STOP taking these medications   Aspirin Buf(CaCarb-MgCarb-MgO) 81 MG Tabs Stopped by: Abbie Sons, MD   aspirin EC 81 MG tablet Stopped by: Abbie Sons, MD     TAKE these medications   acetaminophen 500 MG tablet Commonly known as: TYLENOL Take 500 mg by mouth as needed (Per pt 500-650mg  once daily as needed).   acetaminophen-codeine 300-30 MG tablet Commonly known as: TYLENOL #3   CALCIUM CITRATE + D PO Take by mouth 2 (two) times daily. 600-200mg    cholestyramine 4 GM/DOSE powder Commonly known as: QUESTRAN Take 4 g by mouth daily.   diphenhydrAMINE 25 MG tablet Commonly known as: BENADRYL Take 25 mg by mouth at bedtime as needed.   finasteride 5 MG tablet Commonly known as: PROSCAR Take 1 tablet (5 mg total) by mouth daily.   GLUCOSAMINE 1500 COMPLEX PO Take 1,500 mg by mouth 2 (two) times daily.   multivitamin tablet Take 1 tablet by mouth daily.   naproxen 500 MG tablet Commonly known as: NAPROSYN Take by mouth.   naproxen sodium 220 MG tablet Commonly known as: ALEVE Take 220 mg by mouth daily as needed.   polyethylene glycol-electrolytes 420 g solution Commonly known as: NuLYTELY Take 4,000 mLs by mouth  once.   rosuvastatin 5 MG tablet Commonly known as: CRESTOR Take 5 mg by mouth daily.   sildenafil 20 MG tablet Commonly known as: REVATIO 3-5 po tablets daily as needed   tamsulosin 0.4 MG Caps capsule Commonly known as: FLOMAX Take 1 capsule (0.4 mg total) by mouth daily.   tizanidine 2 MG capsule Commonly known as: ZANAFLEX Take 2 mg by mouth 3 (three) times daily. Per pt takes as needed   TURMERIC PO Take 500 mg by mouth daily.   Vitamin B-12 5000 MCG Subl Place under the tongue.   vitamin C 1000 MG tablet Take 1,000 mg by mouth daily.   Vitamin D-3 25 MCG (1000 UT) Caps Take 1,000 Units  by mouth daily.       Allergies:  Allergies  Allergen Reactions  . Food Color Red Anaphylaxis  . Other Anaphylaxis    Certain Red Apples  . Soy Allergy     Other reaction(s): Headache  . Cetylpyridinium     Other reaction(s): Unknown Other reaction(s): Unknown   . Statins     Other reaction(s): Unknown Other reaction(s): Unknown     Family History: History reviewed. No pertinent family history.  Social History:  reports that he quit smoking about 41 years ago. His smoking use included pipe. He quit after 20.00 years of use. He has never used smokeless tobacco. He reports that he does not drink alcohol and does not use drugs.   Physical Exam: BP 126/82   Pulse 88   Ht 6' (1.829 m)   Wt 207 lb (93.9 kg)   BMI 28.07 kg/m   Constitutional:  Alert and oriented, No acute distress. HEENT: [place] AT, moist mucus membranes.  Trachea midline, no masses. Cardiovascular: No clubbing, cyanosis, or edema. Respiratory: Normal respiratory effort, no increased work of breathing. Skin: No rashes, bruises or suspicious lesions. Neurologic: Grossly intact, no focal deficits, moving all 4 extremities. Psychiatric: Normal mood and affect.   Pertinent Imaging: KUB today reviewed with bilateral renal calculi, interval growth noted when compared with KUB 2019   Assessment & Plan:    1.  BPH with LUTS  Stable on combination therapy  Tamsulosin/finasteride refilled  2.  Nephrolithiasis  Asymptomatic, nonobstructing bilateral calculi  Interval growth noted since 2019  Desires to continue surveillance, repeat KUB 1 year   Abbie Sons, MD  [facility] [address], [place], [place] [address] [phone]

## 2019-11-11 DIAGNOSIS — X32XXXA Exposure to sunlight, initial encounter: Secondary | ICD-10-CM | POA: Diagnosis not present

## 2019-11-11 DIAGNOSIS — L570 Actinic keratosis: Secondary | ICD-10-CM | POA: Diagnosis not present

## 2019-11-11 DIAGNOSIS — D225 Melanocytic nevi of trunk: Secondary | ICD-10-CM | POA: Diagnosis not present

## 2019-11-11 DIAGNOSIS — D2271 Melanocytic nevi of right lower limb, including hip: Secondary | ICD-10-CM | POA: Diagnosis not present

## 2019-11-11 DIAGNOSIS — L538 Other specified erythematous conditions: Secondary | ICD-10-CM | POA: Diagnosis not present

## 2019-11-11 DIAGNOSIS — L820 Inflamed seborrheic keratosis: Secondary | ICD-10-CM | POA: Diagnosis not present

## 2019-11-11 DIAGNOSIS — Z85828 Personal history of other malignant neoplasm of skin: Secondary | ICD-10-CM | POA: Diagnosis not present

## 2019-11-11 DIAGNOSIS — D2261 Melanocytic nevi of right upper limb, including shoulder: Secondary | ICD-10-CM | POA: Diagnosis not present

## 2019-11-11 DIAGNOSIS — D2262 Melanocytic nevi of left upper limb, including shoulder: Secondary | ICD-10-CM | POA: Diagnosis not present

## 2019-12-16 DIAGNOSIS — H903 Sensorineural hearing loss, bilateral: Secondary | ICD-10-CM | POA: Diagnosis not present

## 2019-12-23 DIAGNOSIS — H903 Sensorineural hearing loss, bilateral: Secondary | ICD-10-CM | POA: Diagnosis not present

## 2020-09-24 ENCOUNTER — Ambulatory Visit: Payer: Self-pay | Admitting: Urology

## 2020-10-06 ENCOUNTER — Other Ambulatory Visit: Payer: Self-pay | Admitting: *Deleted

## 2020-10-06 DIAGNOSIS — N200 Calculus of kidney: Secondary | ICD-10-CM

## 2020-10-07 ENCOUNTER — Ambulatory Visit
Admission: RE | Admit: 2020-10-07 | Discharge: 2020-10-07 | Disposition: A | Discharge status: Home / Self Care | Payer: Medicare Other | Attending: Urology | Admitting: Urology

## 2020-10-07 ENCOUNTER — Ambulatory Visit
Admission: RE | Admit: 2020-10-07 | Discharge: 2020-10-07 | Disposition: A | Discharge status: Home / Self Care | Payer: Medicare Other | Source: Ambulatory Visit | Attending: Urology | Admitting: Urology

## 2020-10-07 ENCOUNTER — Encounter: Payer: Self-pay | Admitting: Urology

## 2020-10-07 ENCOUNTER — Ambulatory Visit: Payer: Medicare Other | Admitting: Urology

## 2020-10-07 ENCOUNTER — Other Ambulatory Visit: Payer: Self-pay

## 2020-10-07 VITALS — BP 114/78 | HR 109 | Ht 72.0 in | Wt 203.0 lb

## 2020-10-07 DIAGNOSIS — N200 Calculus of kidney: Secondary | ICD-10-CM | POA: Insufficient documentation

## 2020-10-07 DIAGNOSIS — N201 Calculus of ureter: Secondary | ICD-10-CM

## 2020-10-07 DIAGNOSIS — N401 Enlarged prostate with lower urinary tract symptoms: Secondary | ICD-10-CM

## 2020-10-07 NOTE — Progress Notes (Signed)
10/07/2020 2:24 PM   [name] 29-Oct-1940 [id number]  Referring provider: Baxter Hire, MD [place],  [place] [address]  Chief Complaint  Patient presents with   Nephrolithiasis    Urologic history: 1.  History elevated PSA -Prostate biopsy 2007 benign -Repeat biopsy March 2010 PSA 7.4; volume 54 g; high-grade PIN -Corrected PSA normal since 2014; last PSA 03/2016 was 1.39   2.  BPH with lower urinary tract symptoms -On finasteride/tamsulosin   3.  Nephrolithiasis -Small bilateral renal calculi -Ureteroscopic stone removal 2014   HPI: 80 y.o. male presents for annual follow-up.  Since last years visit his wife has passed away Voiding pattern stable; postvoid dribbling on occasions Remains on tamsulosin/finasteride Intermittent bilateral low back and left groin pain Complains ED but not sexually active and no plans in the near future Previously failed PDE 5 inhibitors  PMH: Past Medical History:  Diagnosis Date   Allergic state    Arthritis    Asthma    BPH (benign prostatic hyperplasia)    Cataracts, bilateral    Hemorrhoids    History of chicken pox    History of colon polyps    History of kidney stones    Hypogonadism in male    Nephrolithiasis     Surgical History: Past Surgical History:  Procedure Laterality Date   COLONOSCOPY WITH PROPOFOL N/A 09/21/2016   Procedure: COLONOSCOPY WITH PROPOFOL;  Surgeon: Manya Silvas, MD;  Location: [facility] ENDOSCOPY;  Service: Endoscopy;  Laterality: N/A;   EYE SURGERY     FRACTURE SURGERY Left    ORIF DISTAL RADIUS FRACTURE AND PERTROCHANTERIC FEMUR FRACTURE   HERNIA REPAIR     KNEE SURGERY      Home Medications:  Allergies as of 10/07/2020       Reactions   Food Color Red Anaphylaxis   Other Anaphylaxis   Certain Red Apples   Soy Allergy    Other reaction(s): Headache   Cetylpyridinium    Other reaction(s): Unknown Other reaction(s): Unknown   Statins    Other  reaction(s): Unknown Other reaction(s): Unknown        Medication List        Accurate as of October 07, 2020  2:24 PM. If you have any questions, ask your nurse or doctor.          acetaminophen 500 MG tablet Commonly known as: TYLENOL Take 500 mg by mouth as needed (Per pt 500-650mg  once daily as needed).   acetaminophen-codeine 300-30 MG tablet Commonly known as: TYLENOL #3   CALCIUM CITRATE + D PO Take by mouth 2 (two) times daily. 600-200mg    cholestyramine 4 GM/DOSE powder Commonly known as: QUESTRAN Take 4 g by mouth daily.   diphenhydrAMINE 25 MG tablet Commonly known as: BENADRYL Take 25 mg by mouth at bedtime as needed.   finasteride 5 MG tablet Commonly known as: PROSCAR Take 1 tablet (5 mg total) by mouth daily.   GLUCOSAMINE 1500 COMPLEX PO Take 1,500 mg by mouth 2 (two) times daily.   multivitamin tablet Take 1 tablet by mouth daily.   naproxen 500 MG tablet Commonly known as: NAPROSYN Take by mouth.   naproxen sodium 220 MG tablet Commonly known as: ALEVE Take 220 mg by mouth daily as needed.   polyethylene glycol-electrolytes 420 g solution Commonly known as: NuLYTELY Take 4,000 mLs by mouth once.   rosuvastatin 5 MG tablet Commonly known as: CRESTOR Take 5 mg by mouth daily.  sildenafil 20 MG tablet Commonly known as: REVATIO 3-5 po tablets daily as needed   tamsulosin 0.4 MG Caps capsule Commonly known as: FLOMAX Take 1 capsule (0.4 mg total) by mouth daily.   tizanidine 2 MG capsule Commonly known as: ZANAFLEX Take 2 mg by mouth 3 (three) times daily. Per pt takes as needed   TURMERIC PO Take 500 mg by mouth daily.   Vitamin B-12 5000 MCG Subl Place under the tongue.   vitamin C 1000 MG tablet Take 1,000 mg by mouth daily.   Vitamin D-3 25 MCG (1000 UT) Caps Take 1,000 Units by mouth daily.        Allergies:  Allergies  Allergen Reactions   Food Color Red Anaphylaxis   Other Anaphylaxis    Certain Red  Apples   Soy Allergy     Other reaction(s): Headache   Cetylpyridinium     Other reaction(s): Unknown Other reaction(s): Unknown    Statins     Other reaction(s): Unknown Other reaction(s): Unknown     Family History: No family history on file.  Social History:  reports that he quit smoking about 42 years ago. His smoking use included pipe. He has never used smokeless tobacco. He reports that he does not drink alcohol and does not use drugs.   Physical Exam: BP 114/78   Pulse (!) 109   Ht 6' (1.829 m)   Wt 203 lb (92.1 kg)   BMI 27.53 kg/m   Constitutional:  Alert and oriented, No acute distress. HEENT: [place] AT, moist mucus membranes.  Trachea midline, no masses. Cardiovascular: No clubbing, cyanosis, or edema. Respiratory: Normal respiratory effort, no increased work of breathing. Psychiatric: Normal mood and affect.   Pertinent Imaging: KUB performed today was personally reviewed and interpreted.  Bilateral calculi overlying the renal shadows.  There is a 4 x 8 mm calcification noted between the L3/4 transverse processes consistent with migration of a renal calculus seen in 2021   Assessment & Plan:    1.  BPH with LUTS Stable on combination therapy Refills sent to pharmacy  2.  Nephrolithiasis KUB today consistent with a right ureteral calculus He is asymptomatic Schedule RUS Based on stone size less likely to pass spontaneously. SWL and ureteroscopy were discussed   Abbie Sons, MD  [facility] [address] [place], [place] [address] [phone]

## 2020-10-08 ENCOUNTER — Encounter: Payer: Self-pay | Admitting: Urology

## 2020-10-08 MED ORDER — TAMSULOSIN HCL 0.4 MG PO CAPS: 0.4000 mg | ORAL_CAPSULE | Freq: Every day | ORAL | 3 refills | Status: DC

## 2020-10-08 MED ORDER — FINASTERIDE 5 MG PO TABS: 5.0000 mg | ORAL_TABLET | Freq: Every day | ORAL | 3 refills | Status: DC

## 2020-10-09 ENCOUNTER — Other Ambulatory Visit: Payer: Self-pay | Admitting: Family Medicine

## 2020-10-09 DIAGNOSIS — N401 Enlarged prostate with lower urinary tract symptoms: Secondary | ICD-10-CM

## 2020-10-09 MED ORDER — TAMSULOSIN HCL 0.4 MG PO CAPS: 0.4000 mg | ORAL_CAPSULE | Freq: Every day | ORAL | 3 refills | Status: DC

## 2020-10-09 MED ORDER — FINASTERIDE 5 MG PO TABS: 5.0000 mg | ORAL_TABLET | Freq: Every day | ORAL | 3 refills | Status: DC

## 2020-10-19 ENCOUNTER — Other Ambulatory Visit: Payer: Self-pay

## 2020-10-19 ENCOUNTER — Ambulatory Visit
Admission: RE | Admit: 2020-10-19 | Discharge: 2020-10-19 | Disposition: A | Discharge status: Home / Self Care | Payer: Medicare Other | Source: Ambulatory Visit | Attending: Urology | Admitting: Urology

## 2020-10-19 DIAGNOSIS — N201 Calculus of ureter: Secondary | ICD-10-CM | POA: Diagnosis present

## 2020-10-19 DIAGNOSIS — N200 Calculus of kidney: Secondary | ICD-10-CM | POA: Diagnosis present

## 2020-10-22 ENCOUNTER — Telehealth: Payer: Self-pay | Admitting: *Deleted

## 2020-10-22 ENCOUNTER — Ambulatory Visit
Admission: RE | Admit: 2020-10-22 | Discharge: 2020-10-22 | Disposition: A | Discharge status: Home / Self Care | Payer: Medicare Other | Source: Ambulatory Visit | Attending: Urology | Admitting: Urology

## 2020-10-22 DIAGNOSIS — N200 Calculus of kidney: Secondary | ICD-10-CM

## 2020-10-22 NOTE — Telephone Encounter (Signed)
-----   Message from Abbie Sons, MD sent at 10/22/2020  7:14 AM EDT ----- Renal ultrasound did not show obstruction.  Recommend a follow-up KUB to see if the stent thought to be in the ureter is moving

## 2020-10-22 NOTE — Telephone Encounter (Signed)
Notified patient as instructed, patient pleased. Discussed follow-up appointments, patient agrees  

## 2020-11-02 ENCOUNTER — Other Ambulatory Visit: Payer: Self-pay | Admitting: Urology

## 2020-11-02 DIAGNOSIS — N201 Calculus of ureter: Secondary | ICD-10-CM

## 2020-11-02 DIAGNOSIS — N200 Calculus of kidney: Secondary | ICD-10-CM

## 2020-11-03 ENCOUNTER — Encounter: Payer: Self-pay | Admitting: *Deleted

## 2020-12-08 ENCOUNTER — Other Ambulatory Visit: Payer: Self-pay

## 2020-12-08 ENCOUNTER — Ambulatory Visit
Admission: RE | Admit: 2020-12-08 | Discharge: 2020-12-08 | Disposition: A | Discharge status: Home / Self Care | Payer: Medicare Other | Source: Ambulatory Visit | Attending: Urology | Admitting: Urology

## 2020-12-08 DIAGNOSIS — N200 Calculus of kidney: Secondary | ICD-10-CM | POA: Diagnosis present

## 2020-12-08 DIAGNOSIS — N201 Calculus of ureter: Secondary | ICD-10-CM | POA: Diagnosis present

## 2020-12-09 ENCOUNTER — Encounter: Payer: Self-pay | Admitting: Urology

## 2020-12-16 ENCOUNTER — Ambulatory Visit: Payer: Medicare Other | Admitting: Urology

## 2020-12-16 ENCOUNTER — Other Ambulatory Visit: Payer: Self-pay

## 2020-12-16 ENCOUNTER — Encounter: Payer: Self-pay | Admitting: Urology

## 2020-12-16 VITALS — BP 165/90 | HR 101 | Ht 72.0 in | Wt 205.0 lb

## 2020-12-16 DIAGNOSIS — N200 Calculus of kidney: Secondary | ICD-10-CM

## 2020-12-16 DIAGNOSIS — N201 Calculus of ureter: Secondary | ICD-10-CM

## 2020-12-16 NOTE — Patient Instructions (Signed)
Lithotripsy Lithotripsy is a treatment that can help break up kidney stones that are too large to pass on their own. This is a nonsurgical procedure that crushes a kidney stone with shock waves. These shock waves pass through your body and focus on the kidney stone. They cause the kidney stone to break up into smaller pieces while it is still in the urinary tract. The smaller pieces of stone can pass more easily out of your body in the urine. Tell a health care provider about: Any allergies you have. All medicines you are taking, including vitamins, herbs, eye drops, creams, and over-the-counter medicines. Any problems you or family members have had with anesthetic medicines. Any blood disorders you have. Any surgeries you have had. Any medical conditions you have. Whether you are pregnant or may be pregnant. What are the risks? Generally, this is a safe procedure. However, problems may occur, including: Infection. Bleeding from the kidney. Bruising of the kidney or skin. Scarring of the kidney, which can lead to: Increased blood pressure. Poor kidney function. Return (recurrence) of kidney stones. Damage to other structures or organs, such as the liver, colon, spleen, or pancreas. Blockage (obstruction) of the tube that carries urine from the kidney to the bladder (ureter). Failure of the kidney stone to break into pieces (fragments). What happens before the procedure? Staying hydrated Follow instructions from your health care provider about hydration, which may include: Up to 2 hours before the procedure - you may continue to drink clear liquids, such as water, clear fruit juice, black coffee, and plain tea. Eating and drinking restrictions Follow instructions from your health care provider about eating and drinking, which may include: 8 hours before the procedure - stop eating heavy meals or foods, such as meat, fried foods, or fatty foods. 6 hours before the procedure - stop eating  light meals or foods, such as toast or cereal. 6 hours before the procedure - stop drinking milk or drinks that contain milk. 2 hours before the procedure - stop drinking clear liquids. Medicines Ask your health care provider about: Changing or stopping your regular medicines. This is especially important if you are taking diabetes medicines or blood thinners. Taking medicines such as aspirin and ibuprofen. These medicines can thin your blood. Do not take these medicines unless your health care provider tells you to take them. Taking over-the-counter medicines, vitamins, herbs, and supplements. Tests You may have tests, such as: Blood tests. Urine tests. Imaging tests, such as a CT scan. General instructions Plan to have someone take you home from the hospital or clinic. If you will be going home right after the procedure, plan to have someone with you for 24 hours. Ask your health care provider what steps will be taken to help prevent infection. These may include washing skin with a germ-killing soap. What happens during the procedure?  An IV will be inserted into one of your veins. You will be given one or more of the following: A medicine to help you relax (sedative). A medicine to make you fall asleep (general anesthetic). A water-filled cushion may be placed behind your kidney or on your abdomen. In some cases, you may be placed in a tub of lukewarm water. Your body will be positioned in a way that makes it easy to target the kidney stone. An X-ray or ultrasound exam will be done to locate your stone. Shock waves will be aimed at the stone. If you are awake, you may feel a tapping sensation as  the shock waves pass through your body. A flexible tube with holes in it (stent) may be placed in the ureter. This will help keep urine flowing from the kidney if the fragments of the stone have been blocking the ureter. The procedure may vary among health care providers and hospitals. What  happens after the procedure? You may have an X-ray to see whether the procedure was able to break up the kidney stone and how much of the stone has passed. If large stone fragments remain after treatment, you may need to have a second procedure at a later time. Your blood pressure, heart rate, breathing rate, and blood oxygen level will be monitored until you leave the hospital or clinic. You may be given antibiotics or pain medicine as needed. If a stent was placed in your ureter during surgery, it may stay in place for a few weeks. You may need to strain your urine to collect pieces of the kidney stone for testing. You will need to drink plenty of water. If you were given a sedative during the procedure, it can affect you for several hours. Do not drive or operate machinery until your health care provider says that it is safe. Summary Lithotripsy is a treatment that can help break up kidney stones that are too large to pass on their own. Lithotripsy is a nonsurgical procedure that crushes a kidney stone with shock waves. Generally, this is a safe procedure. However, problems may occur, including damage to the kidney or other organs, infection, or obstruction of the tube that carries urine from the kidney to the bladder (ureter). You may have a stent placed in your ureter to help drain your urine. This stent may stay in place for a few weeks. After the procedure, you will need to drink plenty of water. You may be asked to strain your urine to collect pieces of the kidney stone for testing. This information is not intended to replace advice given to you by your health care provider. Make sure you discuss any questions you have with your health care provider. Document Revised: 10/09/2018 Document Reviewed: 10/10/2018 Elsevier Patient Education  2022 [place].    Ureteroscopy Ureteroscopy is a procedure to check for and treat problems inside part of the urinary tract. In this procedure, a  thin, flexible tube with a light at the end (ureteroscope) is used to look at the inside of the kidneys and the ureters. The ureters are the tubes that carry urine from the kidneys to the bladder. The ureteroscope is inserted into one or both of the ureters. You may need this procedure if you have frequent urinary tract infections (UTIs), blood in your urine, or a stone in one of your ureters. A ureteroscopy can be done: To find the cause of urine blockage in a ureter and to evaluate other abnormalities inside the ureters or kidneys. To remove stones. To remove or treat growths of tissue (polyps), abnormal tissue, and some types of tumors. To remove a tissue sample and check it for disease under a microscope (biopsy). Tell a health care provider about: Any allergies you have. All medicines you are taking, including vitamins, herbs, eye drops, creams, and over-the-counter medicines. Any problems you or family members have had with anesthetic medicines. Any blood disorders you have. Any surgeries you have had. Any medical conditions you have. Whether you are pregnant or may be pregnant. What are the risks? Generally, this is a safe procedure. However, problems may occur, including: Bleeding. Infection.  Allergic reactions to medicines. Scarring that narrows the ureter (stricture). Creating a hole in the ureter (perforation). What happens before the procedure? Staying hydrated Follow instructions from your health care provider about hydration, which may include: Up to 2 hours before the procedure - you may continue to drink clear liquids, such as water, clear fruit juice, black coffee, and plain tea.  Eating and drinking restrictions Follow instructions from your health care provider about eating and drinking, which may include: 8 hours before the procedure - stop eating heavy meals or foods, such as meat, fried foods, or fatty foods. 6 hours before the procedure - stop eating light meals or  foods, such as toast or cereal. 6 hours before the procedure - stop drinking milk or drinks that contain milk. 2 hours before the procedure - stop drinking clear liquids. Medicines Ask your health care provider about: Changing or stopping your regular medicines. This is especially important if you are taking diabetes medicines or blood thinners. Taking medicines such as aspirin and ibuprofen. These medicines can thin your blood. Do not take these medicines unless your health care provider tells you to take them. Taking over-the-counter medicines, vitamins, herbs, and supplements. General instructions Do not use any products that contain nicotine or tobacco for at least 4 weeks before the procedure. These products include cigarettes, e-cigarettes, and chewing tobacco. If you need help quitting, ask your health care provider. You may have a urine sample taken to check for infection. Plan to have someone take you home from the hospital or clinic. If you will be going home right after the procedure, plan to have someone with you for 24 hours. Ask your health care provider what steps will be taken to help prevent infection. These may include: Washing skin with a germ-killing soap. Receiving antibiotic medicine. What happens during the procedure?  An IV will be inserted into one of your veins. You will be given one or more of the following: A medicine to help you relax (sedative). A medicine to make you fall asleep (general anesthetic). A medicine that is injected into your spine to numb the area below and slightly above the injection site (spinal anesthetic). The part of your body that drains urine from your bladder (urethra) will be cleaned with a germ-killing solution. The ureteroscope will be passed through your urethra into your bladder. A salt-water solution will be sent through the ureteroscope to fill your bladder. This will help the health care provider see the openings of your ureters  more clearly. The ureteroscope will be passed into your ureter. If a growth is found, a biopsy may be done. If a stone is found, it may be removed through the ureteroscope, or the stone may be broken up using a laser, shock waves, or electrical energy. In some cases, if the ureter is too small, a tube may be inserted that keeps the ureter open (ureteral stent). The stent may be left in place for 1 or 2 weeks to keep the ureter open, and then the ureteroscopy procedure will be done. The scope will be removed, and your bladder will be emptied. The procedure may vary among health care providers and hospitals. What can I expect after the procedure? After your procedure, it is common to have: Your blood pressure, heart rate, breathing rate, and blood oxygen level monitored until you leave the hospital or clinic. A burning sensation when you urinate. You may be asked to urinate. Blood in your urine. Mild discomfort in your bladder area  or kidney area when urinating. A need to urinate more often or urgently. Follow these instructions at home: Medicines Take over-the-counter and prescription medicines only as told by your health care provider. If you were prescribed an antibiotic medicine, take it as told by your health care provider. Do not stop taking the antibiotic even if you start to feel better. General instructions  If you were given a sedative during the procedure, it can affect you for several hours. Do not drive or operate machinery until your health care provider says that it is safe. To relieve burning, take a warm bath or hold a warm washcloth over your groin. Drink enough fluid to keep your urine pale yellow. Drink two 8-ounce (237 mL) glasses of water every hour for the first 2 hours after you get home. Continue to drink water often at home. You can eat what you normally do. Keep all follow-up visits as told by your health care provider. This is important. If you had a ureteral  stent placed, ask your health care provider when you need to return to have it removed. Contact a health care provider if you have: Chills or a fever. Burning pain for longer than 24 hours after the procedure. Blood in your urine for longer than 24 hours after the procedure. Get help right away if you have: Large amounts of blood in your urine. Blood clots in your urine. Severe pain. Chest pain or trouble breathing. The feeling of a full bladder and you are unable to urinate. These symptoms may represent a serious problem that is an emergency. Do not wait to see if the symptoms will go away. Get medical help right away. Call your local emergency services (911 in the [place] Summary Ureteroscopy is a procedure to check for and treat problems inside part of the urinary tract. In this procedure, a thin, flexible tube with a light at the end (ureteroscope) is used to look at the inside of the kidneys and the ureters. You may need this procedure if you have frequent urinary tract infections (UTIs), blood in your urine, or a stone in a ureter. This information is not intended to replace advice given to you by your health care provider. Make sure you discuss any questions you have with your health care provider. Document Revised: 09/09/2020 Document Reviewed: 10/03/2018 Elsevier Patient Education  [place].

## 2020-12-16 NOTE — Progress Notes (Signed)
12/16/2020 2:17 PM   Devin Landry December 16, 1940 [id number]  Referring provider: Baxter Hire, MD [place],  [place] [address]  Chief Complaint  Patient presents with   Follow-up    HPI: 80 y.o. male recently found to have a nonobstructing right proximal ureteral calculus measuring 5 mm and presents today to discuss treatment options.  Complains of bilateral low back pain along the belt line Intermittent sharp left back pain over the past 3 weeks Has bilateral, nonobstructing renal calculi Although nonobstructing we discussed that treatment of ureteral calculi is recommended   PMH: Past Medical History:  Diagnosis Date   Allergic state    Arthritis    Asthma    BPH (benign prostatic hyperplasia)    Cataracts, bilateral    Hemorrhoids    History of chicken pox    History of colon polyps    History of kidney stones    Hypogonadism in male    Nephrolithiasis     Surgical History: Past Surgical History:  Procedure Laterality Date   COLONOSCOPY WITH PROPOFOL N/A 09/21/2016   Procedure: COLONOSCOPY WITH PROPOFOL;  Surgeon: Manya Silvas, MD;  Location: [facility] END[facility]COPY;  Service: Endoscopy;  Laterality: N/A;   EYE SURGERY     FRACTURE SURGERY Left    ORIF DISTAL RADIUS FRACTURE AND PERTROCHANTERIC FEMUR FRACTURE   HERNIA REPAIR     KNEE SURGERY      Home Medications:  Allergies as of 12/16/2020       Reactions   Food Color Red Anaphylaxis   Other Anaphylaxis   Certain Red Apples   Soy Allergy    Other reaction(s): Headache   Cetylpyridinium    Other reaction(s): Unknown Other reaction(s): Unknown   Statins    Other reaction(s): Unknown Other reaction(s): Unknown        Medication List        Accurate as of December 16, 2020  2:17 PM. If you have any questions, ask your nurse or doctor.          acetaminophen 500 MG tablet Commonly known as: TYLENOL Take 500 mg by mouth as needed (Per pt 500-650mg  once daily as needed).    acetaminophen-codeine 300-30 MG tablet Commonly known as: TYLENOL #3   CALCIUM CITRATE + D PO Take by mouth 2 (two) times daily. 600-200mg    cholestyramine 4 GM/DOSE powder Commonly known as: QUESTRAN Take 4 g by mouth daily.   diphenhydrAMINE 25 MG tablet Commonly known as: BENADRYL Take 25 mg by mouth at bedtime as needed.   finasteride 5 MG tablet Commonly known as: PROSCAR Take 1 tablet (5 mg total) by mouth daily.   GLUCOSAMINE 1500 COMPLEX PO Take 1,500 mg by mouth 2 (two) times daily.   multivitamin tablet Take 1 tablet by mouth daily.   naproxen 500 MG tablet Commonly known as: NAPROSYN Take by mouth.   naproxen sodium 220 MG tablet Commonly known as: ALEVE Take 220 mg by mouth daily as needed.   polyethylene glycol-electrolytes 420 g solution Commonly known as: NuLYTELY Take 4,000 mLs by mouth once.   rosuvastatin 5 MG tablet Commonly known as: CRESTOR Take 5 mg by mouth daily.   sildenafil 20 MG tablet Commonly known as: REVATIO 3-5 po tablets daily as needed   tamsulosin 0.4 MG Caps capsule Commonly known as: FLOMAX Take 1 capsule (0.4 mg total) by mouth daily.   tizanidine 2 MG capsule Commonly known as: ZANAFLEX Take 2 mg by mouth 3 (three)  times daily. Per pt takes as needed   TURMERIC PO Take 500 mg by mouth daily.   Vitamin B-12 5000 MCG Subl Place under the tongue.   vitamin C 1000 MG tablet Take 1,000 mg by mouth daily.   Vitamin D-3 25 MCG (1000 UT) Caps Take 1,000 Units by mouth daily.        Allergies:  Allergies  Allergen Reactions   Food Color Red Anaphylaxis   Other Anaphylaxis    Certain Red Apples   Soy Allergy     Other reaction(s): Headache   Cetylpyridinium     Other reaction(s): Unknown Other reaction(s): Unknown    Statins     Other reaction(s): Unknown Other reaction(s): Unknown     Family History: History reviewed. No pertinent family history.  Social History:  reports that he quit smoking  about 42 years ago. His smoking use included pipe. He has never used smokeless tobacco. He reports that he does not drink alcohol and does not use drugs.   Physical Exam: BP (!) 165/90   Pulse (!) 101   Ht 6' (1.829 m)   Wt 205 lb (93 kg)   BMI 27.80 kg/m   Constitutional:  Alert and oriented, No acute distress. HEENT: [place] AT, moist mucus membranes.  Trachea midline, no masses. Cardiovascular: No clubbing, cyanosis, or edema. Respiratory: Normal respiratory effort, no increased work of breathing. Psychiatric: Normal mood and affect.   Assessment & Plan:    1.  Right proximal ureteral calculus  We discussed various treatment options for urolithiasis including observation with or without medical expulsive therapy, shockwave lithotripsy (SWL), ureteroscopy and laser lithotripsy with stent placement.  We discussed that management is based on stone size, location, density, patient co-morbidities, and patient preference.   Stones <24mm in size have a >80% spontaneous passage rate. Data surrounding the use of tamsulosin for medical expulsive therapy is controversial, but meta analyses suggests it is most efficacious for distal stones between 5-73mm in size. Possible side effects include dizziness/lightheadedness, and retrograde ejaculation.  Based on prior imaging the calculus has not progressed in 2 months making the possibility of spontaneous passage lower  SWL has a lower stone free rate in a single procedure, but also a lower complication rate compared to ureteroscopy and avoids a stent and associated stent related symptoms. Possible complications include renal hematoma, steinstrasse, and need for additional treatment.  Ureteroscopy with laser lithotripsy and stent placement has a higher stone free rate than SWL in a single procedure, however increased complication rate including possible infection, ureteral injury, bleeding, and stent related morbidity. Common stent related symptoms  include dysuria, urgency/frequency, and flank pain.  He also has nonobstructing right renal calculi and these could be treated at the time of his ureteral calculus  He would like to think over these options and will call back with his decision.   Abbie Sons, [place] [address] [place], [place] [address] [phone]

## 2021-01-24 ENCOUNTER — Telehealth: Payer: Self-pay | Admitting: Urology

## 2021-01-24 NOTE — Telephone Encounter (Signed)
We discussed treatment of his nonobstructing ureteral calculus 12/16/2020.  He was going to think over the option of shockwave lithotripsy or ureteroscopic removal.  Please contact patient to see if he has made a decision regarding treatment or if he has any questions.

## 2021-01-25 NOTE — Telephone Encounter (Signed)
Patient would like to have shockwave lithotripsy  done in March 2023

## 2021-01-28 ENCOUNTER — Other Ambulatory Visit: Payer: Self-pay | Admitting: Urology

## 2021-01-28 NOTE — Progress Notes (Signed)
ESWL ORDER FORM  Expected date of procedure: Wants to schedule March 2023  Surgeon: Based on treatment date  Post op standing: 2-4wk follow up w/KUB prior  Anticoagulation/Aspirin/NSAID standing order: Hold all 72 hours prior  Anesthesia standing order: MAC  VTE standing: SCD's  Dx: Right Ureteral Stone  Procedure: right Extracorporeal shock wave lithotripsy  CPT : 50590  Standing Order Set:   *NPO after mn, KUB  *NS 114m/hr, Keflex 5064mPO, Benadryl 2574mO, Valium 79m35m, Zofran 4mg 84m Would have him do a KUB 1 week prior to procedure date to make sure stone is still present  Medications if other than standing orders:   NONE

## 2021-02-18 ENCOUNTER — Other Ambulatory Visit (HOSPITAL_COMMUNITY): Payer: Self-pay | Admitting: Physician Assistant

## 2021-02-18 DIAGNOSIS — M4807 Spinal stenosis, lumbosacral region: Secondary | ICD-10-CM

## 2021-02-22 ENCOUNTER — Other Ambulatory Visit: Payer: Self-pay

## 2021-02-22 ENCOUNTER — Telehealth: Payer: Self-pay

## 2021-02-22 ENCOUNTER — Ambulatory Visit
Admission: RE | Admit: 2021-02-22 | Discharge: 2021-02-22 | Disposition: A | Discharge status: Home / Self Care | Payer: Medicare Other | Attending: Urology | Admitting: Urology

## 2021-02-22 ENCOUNTER — Ambulatory Visit
Admission: RE | Admit: 2021-02-22 | Discharge: 2021-02-22 | Disposition: A | Discharge status: Home / Self Care | Payer: Medicare Other | Source: Ambulatory Visit | Attending: Urology | Admitting: Urology

## 2021-02-22 DIAGNOSIS — N201 Calculus of ureter: Secondary | ICD-10-CM | POA: Insufficient documentation

## 2021-02-22 NOTE — Addendum Note (Signed)
Addended by: Gerald Leitz A on: 02/22/2021 11:10 AM   Modules accepted: Orders

## 2021-02-22 NOTE — Telephone Encounter (Signed)
Pt called and LM on triage line stating he would like to know  how to proceed now that KUB has resulted. Please advise.

## 2021-02-22 NOTE — Progress Notes (Signed)
Patient is allergic to red-dye, spoke with Dr. Bernardo Heater. Changed standing orders for ESWL. We have switched Keflex to Ancef and taken away the benadryl.

## 2021-02-23 NOTE — Telephone Encounter (Signed)
Stone was no longer visualized on KUB.  He is either passed it or it has migrated and is overlying the pelvic bones.  Will not be able to do lithotripsy this week since unable to see on x-ray.  Would recommend a repeat KUB in 1 month

## 2021-02-25 ENCOUNTER — Ambulatory Visit: Admission: RE | Admit: 2021-02-25 | Payer: Medicare Other | Source: Home / Self Care | Admitting: Urology

## 2021-02-25 ENCOUNTER — Encounter: Admission: RE | Payer: Self-pay | Source: Home / Self Care

## 2021-02-25 SURGERY — LITHOTRIPSY, ESWL
Anesthesia: Moderate Sedation | Laterality: Right

## 2021-02-25 NOTE — Telephone Encounter (Signed)
Spoke with pt. Pt. Advised that MRIs typically do not show stones. Patient will call after MRI to set up further appts.

## 2021-03-04 ENCOUNTER — Ambulatory Visit
Admission: RE | Admit: 2021-03-04 | Discharge: 2021-03-04 | Disposition: A | Discharge status: Home / Self Care | Payer: Medicare Other | Source: Ambulatory Visit | Attending: Physician Assistant | Admitting: Physician Assistant

## 2021-03-04 ENCOUNTER — Other Ambulatory Visit: Payer: Self-pay

## 2021-03-04 DIAGNOSIS — M4807 Spinal stenosis, lumbosacral region: Secondary | ICD-10-CM | POA: Diagnosis present

## 2021-03-24 ENCOUNTER — Ambulatory Visit
Admission: RE | Admit: 2021-03-24 | Discharge: 2021-03-24 | Disposition: A | Discharge status: Home / Self Care | Payer: Medicare Other | Source: Ambulatory Visit | Attending: Urology | Admitting: Urology

## 2021-03-24 ENCOUNTER — Ambulatory Visit: Payer: Medicare Other | Admitting: Urology

## 2021-03-24 DIAGNOSIS — N201 Calculus of ureter: Secondary | ICD-10-CM

## 2021-03-26 ENCOUNTER — Encounter: Payer: Self-pay | Admitting: Urology

## 2021-03-27 ENCOUNTER — Encounter: Payer: Self-pay | Admitting: Urology

## 2021-03-30 ENCOUNTER — Other Ambulatory Visit: Payer: Self-pay | Admitting: Urology

## 2021-03-30 DIAGNOSIS — N200 Calculus of kidney: Secondary | ICD-10-CM

## 2021-03-30 NOTE — Progress Notes (Signed)
Surgical Physician Order Form [facility] [place] ? ?* Scheduling expectation :  Requesting first week of April ? ?*Length of Case: 60 minutes ? ?*Clearance needed: no ? ?*Anticoagulation Instructions: N/A ? ?*Aspirin Instructions: N/A ? ?*Post-op visit Date/Instructions:  1 week cysto stent removal ? ?*Diagnosis: Right Nephrolithiasis ? ?*Procedure: Right Ureteroscopy w/laser lithotripsy & stent placement (58832) ? ? ?Additional orders: N/A ? ?-Admit type: OUTpatient ? ?-Anesthesia: General ? ?-VTE Prophylaxis Standing Order SCD?s    ?   ?Other:  ? ?-Standing Lab Orders Per Anesthesia   ? ?Lab other: UA&Urine Culture ? ?-Standing Test orders EKG/Chest x-ray per Anesthesia      ? ?Test other:  ? ?- Medications:  Ancef 2gm IV ? ?-Other orders:  N/A ? ? ? ?  ? ?

## 2021-03-31 ENCOUNTER — Other Ambulatory Visit: Payer: Self-pay

## 2021-03-31 DIAGNOSIS — N200 Calculus of kidney: Secondary | ICD-10-CM

## 2021-04-05 ENCOUNTER — Other Ambulatory Visit: Payer: Medicare Other

## 2021-04-05 ENCOUNTER — Other Ambulatory Visit: Payer: Self-pay

## 2021-04-05 DIAGNOSIS — N200 Calculus of kidney: Secondary | ICD-10-CM

## 2021-04-05 LAB — URINALYSIS, COMPLETE
Bilirubin, UA: NEGATIVE
Glucose, UA: NEGATIVE
Ketones, UA: NEGATIVE
Leukocytes,UA: NEGATIVE
Nitrite, UA: NEGATIVE
Protein,UA: NEGATIVE
RBC, UA: NEGATIVE
Specific Gravity, UA: 1.02 (ref 1.005–1.030)
Urobilinogen, Ur: 0.2 mg/dL (ref 0.2–1.0)
pH, UA: 6 (ref 5.0–7.5)

## 2021-04-05 LAB — MICROSCOPIC EXAMINATION: Bacteria, UA: NONE SEEN

## 2021-04-06 ENCOUNTER — Telehealth: Payer: Self-pay

## 2021-04-06 NOTE — Telephone Encounter (Signed)
I spoke with Devin Landry. We have discussed possible surgery dates and Tuesday April 4th, 2023 was agreed upon by all parties. Patient given information about surgery date, what to expect pre-operatively and post operatively.  ?We discussed that a Pre-Admission Testing office will be calling to set up the pre-op visit that will take place prior to surgery, and that these appointments are typically done over the phone with a Pre-Admissions RN.  ? ?Informed patient that our office will communicate any additional care to be provided after surgery. Patients questions or concerns were discussed during our call. Advised to call our office should there be any additional information, questions or concerns that arise. Patient verbalized understanding.  ? ?

## 2021-04-06 NOTE — Progress Notes (Signed)
[facility] Posting Form  ? ?Surgery Date/Time: Date: 04/13/2021 ? ?Surgeon: Dr. John Giovanni, MD ? ?Surgery Location: Day Surgery ? ?Inpt ( No  )   Outpt (Yes)   Obs ( No  )  ? ?Diagnosis: N20.0 Right Nephrolithiasis ? ?-CPT: 69794 ? ?Surgery: Right Ureteroscopy with laser litotripsy and stent placement ? ?Stop Anticoagulations: N/A  ? ?Cardiac/Medical/Pulmonary Clearance needed: no ? ?*Orders entered into EPIC  Date: 04/06/21  ? ?*Case booked in [place]  Date: 03/31/2021 ? ?*Notified pt of Surgery: Date: 03/31/2021 ? ?PRE-OP UA & CX: Yes, obtained in clinic yesterday 04/05/2021 ? ?*Placed into Prior Authorization Work Fabio Bering Date: 04/06/21 ? ? ?Assistant/laser/rep:No ? ? ? ? ? ? ? ? ? ? ? ? ? ? ? ?

## 2021-04-08 ENCOUNTER — Encounter
Admission: RE | Admit: 2021-04-08 | Discharge: 2021-04-08 | Disposition: A | Discharge status: Home / Self Care | Payer: Medicare Other | Source: Ambulatory Visit | Attending: Urology | Admitting: Urology

## 2021-04-08 ENCOUNTER — Other Ambulatory Visit: Payer: Self-pay

## 2021-04-08 HISTORY — DX: Malignant (primary) neoplasm, unspecified: C80.1

## 2021-04-08 HISTORY — DX: Umbilical hernia without obstruction or gangrene: K42.9

## 2021-04-08 HISTORY — DX: Spinal stenosis, lumbar region without neurogenic claudication: M48.061

## 2021-04-08 HISTORY — DX: Pure hypercholesterolemia, unspecified: E78.00

## 2021-04-08 HISTORY — DX: Elevated prostate specific antigen (PSA): R97.20

## 2021-04-08 HISTORY — DX: Bursopathy, unspecified: M71.9

## 2021-04-08 HISTORY — DX: Cardiac murmur, unspecified: R01.1

## 2021-04-08 HISTORY — DX: Carcinoma in situ of prostate: D07.5

## 2021-04-08 HISTORY — DX: Male erectile dysfunction, unspecified: N52.9

## 2021-04-08 LAB — CULTURE, URINE COMPREHENSIVE

## 2021-04-08 NOTE — Patient Instructions (Addendum)
Your procedure is scheduled on:04-13-21 Tuesday ?Report to the Registration Desk on the 1st floor of the [facility].Then proceed to the 2nd floor Surgery Desk in the [facility] ?To find out your arrival time, please call [phone] between 1PM - 3PM on:04-12-21 Monday ? ?REMEMBER: ?Instructions that are not followed completely may result in serious medical risk, up to and including death; or upon the discretion of your surgeon and anesthesiologist your surgery may need to be rescheduled. ? ?Do not eat food OR drink any liquids after midnight the night before surgery.  ?No gum chewing, lozengers or hard candies. ? ?Do NOT take any medication the day of surgery ? ?One week prior to surgery: ?Stop Anti-inflammatories (NSAIDS) such as Advil, Aleve, Ibuprofen, Motrin, Naproxen, Naprosyn and Aspirin based products such as Excedrin, Goodys Powder, BC Powder.You may however, continue to take Tylenol if needed for pain up until the day of surgery. ? ?Stop ANY OVER THE COUNTER supplements/vitamins NOW (04-08-21) until after surgery (VITAMIN C, CALCIUM CITRATE + D, B-12 , GLUCOSAMINE 1500 COMPLEX , MULTIVITAMIN,  ? ?No Alcohol for 24 hours before or after surgery. ? ?No Smoking including e-cigarettes for 24 hours prior to surgery.  ?No chewable tobacco products for at least 6 hours prior to surgery.  ?No nicotine patches on the day of surgery. ? ?Do not use any "recreational" drugs for at least a week prior to your surgery.  ?Please be advised that the combination of cocaine and anesthesia may have negative outcomes, up to and including death. ?If you test positive for cocaine, your surgery will be cancelled. ? ?On the morning of surgery brush your teeth with toothpaste and water, you may rinse your mouth with mouthwash if you wish. ?Do not swallow any toothpaste or mouthwash. ? ?Do not wear jewelry, make-up, hairpins, clips or nail polish. ? ?Do not wear lotions, powders, or perfumes.  ? ?Do not shave body from the neck  down 48 hours prior to surgery just in case you cut yourself which could leave a site for infection.  ?Also, freshly shaved skin may become irritated if using the CHG soap. ? ?Contact lenses, hearing aids and dentures may not be worn into surgery. ? ?Do not bring valuables to the hospital. [facility] is not responsible for any missing/lost belongings or valuables.  ? ?Notify your doctor if there is any change in your medical condition (cold, fever, infection). ? ?Wear comfortable clothing (specific to your surgery type) to the hospital. ? ?After surgery, you can help prevent lung complications by doing breathing exercises.  ?Take deep breaths and cough every 1-2 hours. Your doctor may order a device called an Incentive Spirometer to help you take deep breaths. ?When coughing or sneezing, hold a pillow firmly against your incision with both hands. This is called ?splinting.? Doing this helps protect your incision. It also decreases belly discomfort. ? ?If you are being admitted to the hospital overnight, leave your suitcase in the car. ?After surgery it may be brought to your room. ? ?If you are being discharged the day of surgery, you will not be allowed to drive home. ?You will need a responsible adult (18 years or older) to drive you home and stay with you that night.  ? ?If you are taking public transportation, you will need to have a responsible adult (18 years or older) with you. ?Please confirm with your physician that it is acceptable to use public transportation.  ? ?Please call the [facility] Dept. at [phone] if you have any questions about these instructions. ? ?Surgery Visitation Policy: ? ?Patients undergoing a surgery or procedure may have two family members or support persons with them as long as the person is not COVID-19 positive or experiencing its symptoms.  ? ?

## 2021-04-09 ENCOUNTER — Telehealth: Payer: Self-pay

## 2021-04-09 ENCOUNTER — Encounter
Admission: RE | Admit: 2021-04-09 | Discharge: 2021-04-09 | Disposition: A | Discharge status: Home / Self Care | Payer: Medicare Other | Source: Ambulatory Visit | Attending: Urology | Admitting: Urology

## 2021-04-09 DIAGNOSIS — Z01810 Encounter for preprocedural cardiovascular examination: Secondary | ICD-10-CM | POA: Insufficient documentation

## 2021-04-09 MED ORDER — CIPROFLOXACIN HCL 250 MG PO TABS: 250.0000 mg | ORAL_TABLET | Freq: Two times a day (BID) | ORAL | 0 refills | Status: DC

## 2021-04-09 NOTE — Telephone Encounter (Signed)
-----   Message from Abbie Sons, MD sent at 04/09/2021  8:14 AM EDT ----- ?Preop urine culture growing low levels of 3 different bacteria.  Please send Rx Cipro 250 twice daily x2 days and change preoperative antibiotic to Cipro 400 mg IV ?

## 2021-04-09 NOTE — Telephone Encounter (Signed)
Spoke with pt. Pt. Advised of all results and verbalized understanding. Medication has been sent to [facility] per patient request.  ?

## 2021-04-09 NOTE — Addendum Note (Signed)
Addended by: Gerald Leitz A on: 04/09/2021 10:12 AM ? ? Modules accepted: Orders ? ?

## 2021-04-13 ENCOUNTER — Other Ambulatory Visit: Payer: Self-pay

## 2021-04-13 ENCOUNTER — Encounter
Admission: RE | Disposition: A | Discharge status: Home / Self Care | Payer: Self-pay | Source: Home / Self Care | Attending: Urology

## 2021-04-13 ENCOUNTER — Encounter: Payer: Self-pay | Admitting: Urology

## 2021-04-13 ENCOUNTER — Ambulatory Visit: Payer: Medicare Other

## 2021-04-13 ENCOUNTER — Ambulatory Visit: Payer: Medicare Other | Admitting: Urgent Care

## 2021-04-13 ENCOUNTER — Ambulatory Visit: Payer: Medicare Other | Admitting: Certified Registered"

## 2021-04-13 ENCOUNTER — Ambulatory Visit
Admission: RE | Admit: 2021-04-13 | Discharge: 2021-04-13 | Disposition: A | Discharge status: Home / Self Care | Payer: Medicare Other | Attending: Urology | Admitting: Urology

## 2021-04-13 DIAGNOSIS — M1990 Unspecified osteoarthritis, unspecified site: Secondary | ICD-10-CM | POA: Diagnosis not present

## 2021-04-13 DIAGNOSIS — N200 Calculus of kidney: Secondary | ICD-10-CM

## 2021-04-13 DIAGNOSIS — N400 Enlarged prostate without lower urinary tract symptoms: Secondary | ICD-10-CM | POA: Insufficient documentation

## 2021-04-13 DIAGNOSIS — Z87891 Personal history of nicotine dependence: Secondary | ICD-10-CM | POA: Diagnosis not present

## 2021-04-13 HISTORY — PX: CYSTOSCOPY/URETEROSCOPY/HOLMIUM LASER/STENT PLACEMENT: SHX6546

## 2021-04-13 HISTORY — PX: CYSTOSCOPY W/ RETROGRADES: SHX1426

## 2021-04-13 SURGERY — CYSTOSCOPY/URETEROSCOPY/HOLMIUM LASER/STENT PLACEMENT
Anesthesia: General | Laterality: Right

## 2021-04-13 MED ORDER — TRAMADOL HCL 50 MG PO TABS: 50.0000 mg | ORAL_TABLET | Freq: Four times a day (QID) | ORAL | 0 refills | Status: DC | PRN

## 2021-04-13 MED ORDER — ONDANSETRON HCL 4 MG/2ML IJ SOLN
INTRAMUSCULAR | Status: DC | PRN
  Administered 2021-04-13: 4 mg via INTRAVENOUS

## 2021-04-13 MED ORDER — FENTANYL CITRATE (PF) 100 MCG/2ML IJ SOLN
INTRAMUSCULAR | Status: AC
  Filled 2021-04-13: qty 2

## 2021-04-13 MED ORDER — MIDAZOLAM HCL 2 MG/2ML IJ SOLN
INTRAMUSCULAR | Status: AC
  Filled 2021-04-13: qty 2

## 2021-04-13 MED ORDER — ACETAMINOPHEN 10 MG/ML IV SOLN
INTRAVENOUS | Status: DC | PRN
  Administered 2021-04-13: 1000 mg via INTRAVENOUS

## 2021-04-13 MED ORDER — TROSPIUM CHLORIDE 20 MG PO TABS: 20.0000 mg | ORAL_TABLET | Freq: Two times a day (BID) | ORAL | 0 refills | Status: DC | PRN

## 2021-04-13 MED ORDER — LACTATED RINGERS IV SOLN: INTRAVENOUS | Status: DC

## 2021-04-13 MED ORDER — CIPROFLOXACIN IN D5W 400 MG/200ML IV SOLN
INTRAVENOUS | Status: AC
  Filled 2021-04-13: qty 200

## 2021-04-13 MED ORDER — ROCURONIUM BROMIDE 100 MG/10ML IV SOLN
INTRAVENOUS | Status: DC | PRN
  Administered 2021-04-13: 10 mg via INTRAVENOUS
  Administered 2021-04-13: 40 mg via INTRAVENOUS

## 2021-04-13 MED ORDER — CHLORHEXIDINE GLUCONATE 0.12 % MT SOLN
OROMUCOSAL | Status: AC
  Filled 2021-04-13: qty 15

## 2021-04-13 MED ORDER — GLYCOPYRROLATE 0.2 MG/ML IJ SOLN
INTRAMUSCULAR | Status: DC | PRN
  Administered 2021-04-13: .2 mg via INTRAVENOUS

## 2021-04-13 MED ORDER — IOHEXOL 180 MG/ML  SOLN
INTRAMUSCULAR | Status: DC | PRN
  Administered 2021-04-13: 10 mL

## 2021-04-13 MED ORDER — PROPOFOL 10 MG/ML IV BOLUS
INTRAVENOUS | Status: DC | PRN
  Administered 2021-04-13: 120 mg via INTRAVENOUS

## 2021-04-13 MED ORDER — MIDAZOLAM HCL 2 MG/2ML IJ SOLN
INTRAMUSCULAR | Status: DC | PRN
  Administered 2021-04-13: 2 mg via INTRAVENOUS

## 2021-04-13 MED ORDER — DEXAMETHASONE SODIUM PHOSPHATE 10 MG/ML IJ SOLN
INTRAMUSCULAR | Status: DC | PRN
  Administered 2021-04-13: 10 mg via INTRAVENOUS

## 2021-04-13 MED ORDER — FENTANYL CITRATE (PF) 100 MCG/2ML IJ SOLN: 25.0000 ug | INTRAMUSCULAR | Status: DC | PRN

## 2021-04-13 MED ORDER — CIPROFLOXACIN IN D5W 400 MG/200ML IV SOLN
400.0000 mg | Freq: Once | INTRAVENOUS | Status: AC
  Administered 2021-04-13: 400 mg via INTRAVENOUS

## 2021-04-13 MED ORDER — LIDOCAINE HCL (CARDIAC) PF 100 MG/5ML IV SOSY
PREFILLED_SYRINGE | INTRAVENOUS | Status: DC | PRN
Start: 2021-04-13 — End: 2021-04-13
  Administered 2021-04-13: 100 mg via INTRAVENOUS

## 2021-04-13 MED ORDER — SUCCINYLCHOLINE CHLORIDE 200 MG/10ML IV SOSY
PREFILLED_SYRINGE | INTRAVENOUS | Status: DC | PRN
  Administered 2021-04-13: 100 mg via INTRAVENOUS

## 2021-04-13 MED ORDER — ACETAMINOPHEN 10 MG/ML IV SOLN: 1000.0000 mg | Freq: Once | INTRAVENOUS | Status: DC | PRN

## 2021-04-13 MED ORDER — ONDANSETRON HCL 4 MG/2ML IJ SOLN: 4.0000 mg | Freq: Once | INTRAMUSCULAR | Status: DC | PRN

## 2021-04-13 MED ORDER — SODIUM CHLORIDE 0.9 % IR SOLN
Status: DC | PRN
  Administered 2021-04-13: 3000 mL

## 2021-04-13 MED ORDER — ORAL CARE MOUTH RINSE: 15.0000 mL | Freq: Once | OROMUCOSAL | Status: DC

## 2021-04-13 MED ORDER — SUGAMMADEX SODIUM 500 MG/5ML IV SOLN
INTRAVENOUS | Status: DC | PRN
  Administered 2021-04-13: 200 mg via INTRAVENOUS

## 2021-04-13 SURGICAL SUPPLY — 32 items
BAG DRAIN CYSTO-URO LG1000N (MISCELLANEOUS) ×3 IMPLANT
BASKET ZERO TIP 1.9FR (BASKET) ×1 IMPLANT
BRUSH SCRUB EZ 1% IODOPHOR (MISCELLANEOUS) ×3 IMPLANT
BSKT STON RTRVL ZERO TP 1.9FR (BASKET) ×2
CATH URET FLEX-TIP 2 LUMEN 10F (CATHETERS) IMPLANT
CATH URETL OPEN END 6X70 (CATHETERS) IMPLANT
CNTNR SPEC 2.5X3XGRAD LEK (MISCELLANEOUS)
CONT SPEC 4OZ STER OR WHT (MISCELLANEOUS)
CONT SPEC 4OZ STRL OR WHT (MISCELLANEOUS)
CONTAINER SPEC 2.5X3XGRAD LEK (MISCELLANEOUS) IMPLANT
DRAPE UTILITY 15X26 TOWEL STRL (DRAPES) ×3 IMPLANT
DRSG TEGADERM 2-3/8X2-3/4 SM (GAUZE/BANDAGES/DRESSINGS) ×1 IMPLANT
GAUZE 4X4 16PLY ~~LOC~~+RFID DBL (SPONGE) ×6 IMPLANT
GLOVE SURG UNDER POLY LF SZ7.5 (GLOVE) ×3 IMPLANT
GOWN STRL REUS W/ TWL LRG LVL3 (GOWN DISPOSABLE) ×2 IMPLANT
GOWN STRL REUS W/ TWL XL LVL3 (GOWN DISPOSABLE) ×2 IMPLANT
GOWN STRL REUS W/TWL LRG LVL3 (GOWN DISPOSABLE) ×3
GOWN STRL REUS W/TWL XL LVL3 (GOWN DISPOSABLE) ×3
GUIDEWIRE GREEN .038 145CM (MISCELLANEOUS) ×1 IMPLANT
GUIDEWIRE STR DUAL SENSOR (WIRE) ×4 IMPLANT
INFUSOR MANOMETER BAG 3000ML (MISCELLANEOUS) ×3 IMPLANT
IV NS IRRIG 3000ML ARTHROMATIC (IV SOLUTION) ×3 IMPLANT
KIT TURNOVER CYSTO (KITS) ×3 IMPLANT
PACK CYSTO AR (MISCELLANEOUS) ×3 IMPLANT
SET CYSTO W/LG BORE CLAMP LF (SET/KITS/TRAYS/PACK) ×3 IMPLANT
SHEATH URETERAL 12FRX35CM (MISCELLANEOUS) IMPLANT
STENT URET 6FRX24 CONTOUR (STENTS) IMPLANT
STENT URET 6FRX26 CONTOUR (STENTS) ×1 IMPLANT
SURGILUBE 2OZ TUBE FLIPTOP (MISCELLANEOUS) ×3 IMPLANT
TRACTIP FLEXIVA PULSE ID 200 (Laser) ×3 IMPLANT
VALVE UROSEAL ADJ ENDO (VALVE) ×1 IMPLANT
WATER STERILE IRR 500ML POUR (IV SOLUTION) ×3 IMPLANT

## 2021-04-13 NOTE — Transfer of Care (Signed)
Immediate Anesthesia Transfer of Care Note ? ?Patient: Devin Landry ? ?Procedure(s) Performed: CYSTOSCOPY/URETEROSCOPY/HOLMIUM LASER/STENT PLACEMENT (Right) ?CYSTOSCOPY WITH RETROGRADE PYELOGRAM ? ?Patient Location: PACU ? ?Anesthesia Type:General ? ?Level of Consciousness: awake ? ?Airway & Oxygen Therapy: Patient Spontanous Breathing and Patient connected to face mask oxygen ? ?Post-op Assessment: Report given to RN and Post -op Vital signs reviewed and stable ? ?Post vital signs: Reviewed ? ?Last Vitals:  ?Vitals Value Taken Time  ?BP 158/110 04/13/21 1119  ?Temp    ?Pulse 106 04/13/21 1121  ?Resp 22 04/13/21 1121  ?SpO2 99 % 04/13/21 1121  ?Vitals shown include unvalidated device data. ? ?Last Pain:  ?   ? ?  ? ?Complications: No notable events documented. ?

## 2021-04-13 NOTE — Interval H&P Note (Signed)
History and Physical Interval Note: ? ?04/13/2021 ?10:15 AM ? ?Devin Landry  has presented today for surgery, with the diagnosis of Right Nephrolithiasis.  The various methods of treatment have been discussed with the patient and family. After consideration of risks, benefits and other options for treatment, the patient has consented to  Procedure(s): ?CYSTOSCOPY/URETEROSCOPY/HOLMIUM LASER/STENT PLACEMENT (Right) as a surgical intervention.  The patient's history has been reviewed, patient examined, no change in status, stable for surgery.  I have reviewed the patient's chart and labs.  Questions were answered to the patient's satisfaction.   ? ? ?Colby Reels C Brinlee Gambrell ? ? ?

## 2021-04-13 NOTE — Op Note (Signed)
Preoperative diagnosis: Right nephrolithiasis  ? ?Postoperative diagnosis: Right nephrolithiasis ? ?Procedure: ? ?Cystoscopy ?Right ureteroscopy and stone removal ?Ureteroscopic laser lithotripsy ?Right ureteral stent placement (32F/26 cm)  ?Right retrograde pyelography with interpretation ? ?Surgeon: Ronda Fairly. Atreus Hasz, M.D. ? ?Anesthesia: General ? ?Complications: None ? ?Intraoperative findings:  ?Cystoscopy-wide caliber bulbar urethral stricture which did not impede scope passage.  Prominent lateral lobe enlargement with moderate bladder neck elevation.  Bladder mucosa without erythema, solid or papillary lesions.  UOs normal-appearing bilaterally. ?Ureteroscopy-5 mm ureteral calculus was not present.  Ureteral mucosa normal in appearance ?Pyeloscopy-groupings of calyceal calculi in upper middle and lower calyces x4.  Largest calculi approximately 3 mm.  Some adherent to renal papilla ?Right retrograde pyelography post procedure showed no filling defects, stone fragments or contrast extravasation ? ?EBL: Minimal ? ?Specimens: ?None ? ? ?Indication: CUYLER VANDYKEN is a 81 y.o. male with a history of a 5 mm right proximal ureteral calculus.  He was initially scheduled for SWL however prior to procedure the calculus was no longer visualized.  He has been asymptomatic.  He has nonobstructing right renal calculi.  He elected right ureteroscopy to see if the ureteral calculus was still present and clearing of his right renal calculi.  ? ? After reviewing the management options for treatment, the patient elected to proceed with the above surgical procedure(s). We have discussed the potential benefits and risks of the procedure, side effects of the proposed treatment, the likelihood of the patient achieving the goals of the procedure, and any potential problems that might occur during the procedure or recuperation. Informed consent has been obtained. ? ?Description of procedure: ? ?The patient was taken to the operating  room and general anesthesia was induced.  The patient was placed in the dorsal lithotomy position, prepped and draped in the usual sterile fashion, and preoperative antibiotics were administered. A preoperative time-out was performed.  ? ?A 65 French cystoscope was lubricated, passed per urethra and advanced under direct vision with findings as described above. ? ?Attention was directed to the right ureteral orifice and a 0.038 Sensor wire was then advanced up the ureter into the renal pelvis under fluoroscopic guidance. ? ?The cystoscope was removed and a 4.5 French semirigid ureteroscope was passed per urethra.  The right UO was easily engaged and advanced to the mid proximal ureter with findings as described above.  The semirigid scope was then removed. ? ?A single channel digital flexible ureteroscope was passed per urethra.  The ureteroscope was easily advanced into the right UO alongside the guidewire and advanced to the renal pelvis without difficulty.  Pyeloscopy was performed with findings as described above. ? ?A 242 ?m holmium laser fiber was placed through the ureteroscope and the calyceal calculi were dusted at a setting of 0.6J/40 Hz.  All stone containing calyces were treated in a similar fashion.  A few fragments and the upper pole calyx were >1 mm in size and were removed with a 1.9 [place] nitinol basket. ? ?Retrograde pyelogram was performed and each calyx was sequentially examined under fluoroscopic guidance and no fragments larger than the typical laser fiber were identified ? ?A 32F/26 cm Contour ureteral stent was placed under fluoroscopic guidance.  The wire was then removed with an adequate stent curl noted in the renal pelvis as well as in the bladder. ? ?The bladder was then emptied and the procedure ended.  The patient appeared to tolerate the procedure well and without complications.  After anesthetic reversal the patient was  transported to the PACU in stable condition.  ? ?Plan: ?He was  instructed to remove the stent on Thursday 04/15/21. ?Postop follow-up scheduled for/13/23 ? ? ?John Giovanni, MD ?

## 2021-04-13 NOTE — Anesthesia Preprocedure Evaluation (Signed)
Anesthesia Evaluation  ?Patient identified by MRN, date of birth, ID band ?Patient awake ? ? ? ?Reviewed: ?Allergy & Precautions, NPO status , Patient's Chart, lab work & pertinent test results ? ?History of Anesthesia Complications ?Negative for: history of anesthetic complications ? ?Airway ?Mallampati: IV ? ? ?Neck ROM: Full ? ? ? Dental ? ?(+) Missing, Implants ?  ?Pulmonary ?asthma , former smoker (quit 1980),  ?  ?Pulmonary exam normal ?breath sounds clear to auscultation ? ? ? ? ? ? Cardiovascular ?Exercise Tolerance: Good ?negative cardio ROS ?Normal cardiovascular exam ?Rhythm:Regular Rate:Normal ? ?ECG 04/09/21: normal ?  ?Neuro/Psych ?HOH ?  ? GI/Hepatic ?negative GI ROS,   ?Endo/Other  ?negative endocrine ROS ? Renal/GU ?Renal disease (nephrolithiasis)  ? ?BPH ? ?  ?Musculoskeletal ? ?(+) Arthritis ,  ? Abdominal ?  ?Peds ? Hematology ?negative hematology ROS ?(+)   ?Anesthesia Other Findings ? ? Reproductive/Obstetrics ? ?  ? ? ? ? ? ? ? ? ? ? ? ? ? ?  ?  ? ? ? ? ? ? ? ? ?Anesthesia Physical ?Anesthesia Plan ? ?ASA: 2 ? ?Anesthesia Plan: General  ? ?Post-op Pain Management:   ? ?Induction: Intravenous ? ?PONV Risk Score and Plan: 2 and Ondansetron, Dexamethasone and Treatment may vary due to age or medical condition ? ?Airway Management Planned: Oral ETT ? ?Additional Equipment:  ? ?Intra-op Plan:  ? ?Post-operative Plan: Extubation in OR ? ?Informed Consent: I have reviewed the patients History and Physical, chart, labs and discussed the procedure including the risks, benefits and alternatives for the proposed anesthesia with the patient or authorized representative who has indicated his/her understanding and acceptance.  ? ? ? ?Dental advisory given ? ?Plan Discussed with: CRNA ? ?Anesthesia Plan Comments: (Patient consented for risks of anesthesia including but not limited to:  ?- adverse reactions to medications ?- damage to eyes, teeth, lips or other oral mucosa ?-  nerve damage due to positioning  ?- sore throat or hoarseness ?- damage to heart, brain, nerves, lungs, other parts of body or loss of life ? ?Informed patient about role of CRNA in peri- and intra-operative care.  Patient voiced understanding.)  ? ? ? ? ? ? ?Anesthesia Quick Evaluation ? ?

## 2021-04-13 NOTE — Anesthesia Procedure Notes (Signed)
Procedure Name: Intubation ?Date/Time: 04/13/2021 10:25 AM ?Performed by: Kelton Pillar, CRNA ?Pre-anesthesia Checklist: Patient identified, Emergency Drugs available, Suction available and Patient being monitored ?Patient Re-evaluated:Patient Re-evaluated prior to induction ?Oxygen Delivery Method: Circle system utilized ?Preoxygenation: Pre-oxygenation with 100% oxygen ?Induction Type: IV induction ?Ventilation: Mask ventilation without difficulty ?Laryngoscope Size: McGraph and 3 ?Grade View: Grade I ?Tube type: Oral ?Tube size: 7.0 mm ?Number of attempts: 1 ?Airway Equipment and Method: Stylet and Oral airway ?Placement Confirmation: ETT inserted through vocal cords under direct vision, positive ETCO2, breath sounds checked- equal and bilateral and CO2 detector ?Secured at: 21 cm ?Tube secured with: Tape ?Dental Injury: Teeth and Oropharynx as per pre-operative assessment  ? ? ? ? ?

## 2021-04-13 NOTE — Discharge Instructions (Addendum)
AMBULATORY SURGERY  ?DISCHARGE INSTRUCTIONS ? ? ?The drugs that you were given will stay in your system until tomorrow so for the next 24 hours you should not: ? ?Drive an automobile ?Make any legal decisions ?Drink any alcoholic beverage ? ? ?You may resume regular meals tomorrow.  Today it is better to start with liquids and gradually work up to solid foods. ? ?You may eat anything you prefer, but it is better to start with liquids, then soup and crackers, and gradually work up to solid foods. ? ? ?Please notify your doctor immediately if you have any unusual bleeding, trouble breathing, redness and pain at the surgery site, drainage, fever, or pain not relieved by medication. ? ? ? ?Additional Instructions: ? ?DISCHARGE INSTRUCTIONS FOR KIDNEY STONE/URETERAL STENT  ? ?MEDICATIONS:  ?1. Resume all your other meds from home.  ?2.  AZO (over-the-counter) can help with the burning/stinging when you urinate. ?3.  Tramadol is for moderate pain, Rx was sent to your pharmacy. ?4.  Trospium is for stent/bladder irritation.  Rx was sent to your pharmacy ? ?ACTIVITY:  ?1. May resume regular activities in 24 hours. ?2. No driving while on narcotic pain medications  ?3. Drink plenty of water  ?4. Continue to walk at home - you can still get blood clots when you are at home, so keep active, but don't over do it.  ?5. May return to work/school tomorrow or when you feel ready  ? ?BATHING:  ?1. You can shower. ?2. You have a string coming from your urethra: The stent string is attached to your ureteral stent. Do not pull on this.  ? ?SIGNS/SYMPTOMS TO CALL:  ?Common postoperative symptoms include urinary frequency, urgency, bladder spasm and blood in the urine ? ?Please call us if you have a fever greater than 101.5, uncontrolled nausea/vomiting, uncontrolled pain, dizziness, unable to urinate, excessively bloody urine, chest pain, shortness of breath, leg swelling, leg pain, or any other concerns or questions.  ? ?You can reach  [place] at [phone].  ? ?FOLLOW-UP:  ?1. You have a follow-up appointment scheduled 04/22/21 ?2. You have a string attached to your stent, you may remove it on Thursday, 04/15/2021. To do this, pull the string until the stent is completely removed. You may feel an odd sensation in your back.  If you have any questions regarding stent removal please contact our office ? ? ?Please contact your physician with any problems or Same Day Surgery at [phone], Monday through Friday 6 am to 4 pm, or [place] at [facility] number at [phone].  ?

## 2021-04-13 NOTE — Anesthesia Postprocedure Evaluation (Signed)
Anesthesia Post Note ? ?Patient: Devin Landry ? ?Procedure(s) Performed: CYSTOSCOPY/URETEROSCOPY/HOLMIUM LASER/STENT PLACEMENT (Right) ?CYSTOSCOPY WITH RETROGRADE PYELOGRAM ? ?Patient location during evaluation: PACU ?Anesthesia Type: General ?Level of consciousness: awake and alert, oriented and patient cooperative ?Pain management: pain level controlled ?Vital Signs Assessment: post-procedure vital signs reviewed and stable ?Respiratory status: spontaneous breathing, nonlabored ventilation and respiratory function stable ?Cardiovascular status: blood pressure returned to baseline and stable ?Postop Assessment: adequate PO intake ?Anesthetic complications: no ? ? ?No notable events documented. ? ? ?Last Vitals:  ?Vitals:  ? 04/13/21 1200 04/13/21 1214  ?BP: (!) 147/86 (!) 147/82  ?Pulse: 80 78  ?Resp: 12 18  ?Temp:  36.4 ?C  ?SpO2: 98% 95%  ?  ?Last Pain:  ?Vitals:  ? 04/13/21 1214  ?TempSrc: Temporal  ?PainSc:   ? ? ?  ?  ?  ?  ?  ?  ? ?Darrin Nipper ? ? ? ? ?

## 2021-04-13 NOTE — H&P (Signed)
? ?Urology H&P ? ?History of Present Illness: Devin Landry is a 81 y.o. with a history of a 5 mm right proximal ureteral calculus.  He was initially scheduled for SWL however prior to procedure the calculus was no longer visualized.  He has been asymptomatic.  He has nonobstructing right renal calculi.  We discussed the option of repeat CT imaging versus cystoscopy with right retrograde pyelogram and ureteroscopy to clear the right-sided calculi.  He has elected the latter. ? ? ? ?Past Medical History:  ?Diagnosis Date  ? Allergic state   ? Arthritis   ? BPH (benign prostatic hyperplasia)   ? Bursitis   ? hips  ? Cancer [facility])   ? basal and squamous cell  ? Cataracts, bilateral   ? ED (erectile dysfunction)   ? Elevated PSA   ? Heart murmur   ? born with mumur-asymptomatic  ? Hemorrhoids   ? History of chicken pox   ? History of colon polyps   ? History of kidney stones   ? Hypercholesteremia   ? Hypogonadism in male   ? Nephrolithiasis   ? PIN III (prostatic intraepithelial neoplasia III)   ? Spinal stenosis of lumbar region   ? Umbilical hernia   ? ? ?Past Surgical History:  ?Procedure Laterality Date  ? COLONOSCOPY WITH PROPOFOL N/A 09/21/2016  ? Procedure: COLONOSCOPY WITH PROPOFOL;  Surgeon: Manya Silvas, MD;  Location: [facility] ENDOSCOPY;  Service: Endoscopy;  Laterality: N/A;  ? EYE SURGERY Bilateral   ? cataracts  ? FRACTURE SURGERY Left   ? ORIF DISTAL RADIUS FRACTURE AND PERTROCHANTERIC FEMUR FRACTURE  ? HERNIA REPAIR Bilateral   ? age 51  ? KNEE SURGERY    ? ? ?Home Medications:  ?Current Meds  ?Medication Sig  ? acetaminophen (TYLENOL) 650 MG CR tablet Take 650 mg by mouth 2 (two) times daily.  ? Ascorbic Acid (VITAMIN C) 1000 MG tablet Take 1,000 mg by mouth daily.  ? Calcium Citrate-Vitamin D (CALCIUM CITRATE + D PO) Take 1 tablet by mouth 2 (two) times daily.  ? cholestyramine (QUESTRAN) 4 GM/DOSE powder Take 4 g by mouth daily as needed (diarrhea).  ? Cyanocobalamin (B-12 PO) Take 1 tablet by mouth  4 (four) times a week.  ? diphenhydrAMINE (BENADRYL) 12.5 MG/5ML liquid Take 25 mg by mouth 4 (four) times daily as needed for allergies or sleep.  ? finasteride (PROSCAR) 5 MG tablet Take 1 tablet (5 mg total) by mouth daily. (Patient taking differently: Take 5 mg by mouth every evening.)  ? gabapentin (NEURONTIN) 300 MG capsule Take 300 mg by mouth at bedtime.  ? Glucosamine-Chondroit-Vit C-Mn (GLUCOSAMINE 1500 COMPLEX PO) Take 1,500 mg by mouth 2 (two) times daily.  ? Multiple Vitamin (MULTIVITAMIN) tablet Take 1 tablet by mouth daily.  ? naproxen sodium (ANAPROX) 220 MG tablet Take 220 mg by mouth 2 (two) times daily.  ? oxymetazoline (AFRIN) 0.05 % nasal spray Place 1 spray into both nostrils 2 (two) times daily as needed for congestion.  ? rosuvastatin (CRESTOR) 5 MG tablet Take 5 mg by mouth every evening.  ? tamsulosin (FLOMAX) 0.4 MG CAPS capsule Take 1 capsule (0.4 mg total) by mouth daily. (Patient taking differently: Take 0.4 mg by mouth every evening.)  ? ? ?Allergies:  ?Allergies  ?Allergen Reactions  ? Food Color Red Anaphylaxis  ? Other   ?  Certain Red Apples causes anaphylaxis  ? ?Soy sauce causes headaches   ? Cetylpyridinium   ?  Unknown ?  ?  Statins   ?  Unknown tolerates rosuvastatin  ?  ? ? ?History reviewed. No pertinent family history. ? ?Social History:  reports that he quit smoking about 42 years ago. His smoking use included pipe. He has never used smokeless tobacco. He reports that he does not drink alcohol and does not use drugs. ? ?ROS: ?Otherwise noncontributory septa as per the HPI ? ?Physical Exam:  ?Vital signs in last 24 hours: ?Temp:  [98 ?F (36.7 ?C)] 98 ?F (36.7 ?C) (04/04 5465) ?Pulse Rate:  [94] 94 (04/04 0922) ?Resp:  [16] 16 (04/04 0354) ?BP: (137)/(99) 137/99 (04/04 6568) ?SpO2:  [96 %] 96 % (04/04 0922) ?Weight:  [93.4 kg] 93.4 kg (04/04 0922) ?Constitutional:  Alert and oriented, No acute distress ?HEENT: [place] AT, moist mucus membranes.  Trachea midline, no  masses ?Cardiovascular: Regular rate and rhythm ?Respiratory: Normal respiratory effort, lungs clear bilaterally ?Psychiatric: Normal mood and affect ? ? ?Laboratory Data:  ?No results for input(s): WBC, HGB, HCT in the last 72 hours. ?No results for input(s): NA, K, CL, CO2, GLUCOSE, BUN, CREATININE, CALCIUM in the last 72 hours. ?No results for input(s): LABPT, INR in the last 72 hours. ?No results for input(s): LABURIN in the last 72 hours. ?Results for orders placed or performed in visit on 04/05/21  ?CULTURE, URINE COMPREHENSIVE     Status: Abnormal  ? Collection Time: 04/05/21  1:49 PM  ? Specimen: Urine  ? UR  ?Result Value Ref Range Status  ? Urine Culture, Comprehensive Final report (A)  Final  ? Organism ID, Bacteria Klebsiella oxytoca (A)  Final  ?  Comment: 300 Colonies/mL  ? Organism ID, Bacteria Klebsiella pneumoniae (A)  Final  ?  Comment: Cefazolin <=4 ug/mL ?Cefazolin with an MIC <=16 predicts susceptibility to the oral agents ?cefaclor, cefdinir, cefpodoxime, cefprozil, cefuroxime, cephalexin, ?and loracarbef when used for therapy of uncomplicated urinary tract ?infections due to E. coli, Klebsiella pneumoniae, and Proteus ?mirabilis. ?100 Colonies/mL                                                      . ?  ? Organism ID, Bacteria Enterococcus faecalis (A)  Final  ?  Comment: For Enterococcus species, aminoglycosides (except for high-level ?resistance screening), cephalosporins, clindamycin, and ?trimethoprim-sulfamethoxazole are not effective clinically. ?(CLSI, M100-S26, 2016) ?100 Colonies/mL                                                      . ?  ? ANTIMICROBIAL SUSCEPTIBILITY Comment  Final  ?  Comment:       ** S = Susceptible; I = Intermediate; R = Resistant ** ?                   P = Positive; N = Negative ?            MICS are expressed in micrograms per mL ?   Antibiotic                 RSLT#1    RSLT#2    RSLT#3    RSLT#4 ?Amoxicillin/Clavulanic Acid    S  S ?Ampicillin                      R         R ?Cefepime                       S         S ?Ceftriaxone                    S         S ?Cefuroxime                     S         S ?Ciprofloxacin                  S         S         S ?Ertapenem                                S ?Gentamicin                     S         S ?Imipenem                       S         S ?Levofloxacin                   S         S         S ?Meropenem                      S         S ?Nitrofurantoin                 S         R         S ?Penicillin                                         S ?Piperacillin/Tazobactam                  S ?Tetracycline                   S         S         S ?Tobramycin                     S         S ?Trimethoprim/Sul fa             S         S ?Vancomycin                                         S ?  ?Microscopic Examination     Status: Abnormal  ? Collection Time: 04/05/21  1:49 PM  ? Urine  ?Result Value Ref Range Status  ? WBC, UA 0-5 0 - 5 /hpf Final  ?  RBC 0-2 0 - 2 /hpf Final  ? Epithelial Cells (non renal) 0-10 0 - 10 /hpf Final  ? Renal Epithel, UA 0-10 (A) None seen /hpf Final  ? Mucus, UA Present (A) Not Estab. Final  ? Bacteria, UA None seen None seen/Few Final  ? ? ? ?Impression/Assessment:  ?Right nephrolithiasis and history right proximal ureteral calculus ? ?Plan:  ?Cystoscopy with right retrograde pyelogram and right ureteroscopy with laser lithotripsy/stone removal ?The procedure has been discussed in detail including potential risks of bleeding, infection, ureteral injury.  The need for a postoperative stent was discussed.  We discussed that a small percentage of cases due to ureteral anatomy the ureteroscope is unable to be advanced into the kidney. ? ? ?04/13/2021, 10:11 AM  ?John Giovanni,  MD ? ? ? ? ?

## 2021-04-14 ENCOUNTER — Encounter: Payer: Self-pay | Admitting: Urology

## 2021-04-22 ENCOUNTER — Encounter: Payer: Self-pay | Admitting: Urology

## 2021-04-22 ENCOUNTER — Ambulatory Visit (INDEPENDENT_AMBULATORY_CARE_PROVIDER_SITE_OTHER): Payer: Medicare Other | Admitting: Urology

## 2021-04-22 VITALS — BP 154/87 | HR 92 | Ht 72.0 in | Wt 205.0 lb

## 2021-04-22 DIAGNOSIS — N200 Calculus of kidney: Secondary | ICD-10-CM | POA: Diagnosis not present

## 2021-04-22 LAB — URINALYSIS, COMPLETE
Bilirubin, UA: NEGATIVE
Glucose, UA: NEGATIVE
Ketones, UA: NEGATIVE
Leukocytes,UA: NEGATIVE
Nitrite, UA: NEGATIVE
Protein,UA: NEGATIVE
Specific Gravity, UA: 1.02 (ref 1.005–1.030)
Urobilinogen, Ur: 0.2 mg/dL (ref 0.2–1.0)
pH, UA: 6.5 (ref 5.0–7.5)

## 2021-04-22 LAB — MICROSCOPIC EXAMINATION: Bacteria, UA: NONE SEEN

## 2021-04-22 NOTE — Progress Notes (Signed)
? ?04/22/2021 ?12:10 PM  ? ?Devin Landry ?Apr 27, 1940 ?[id number] ? ?Referring provider: Baxter Hire, MD ?Devin Landry ?Devin Landry,  Devin Land[place] [address] ? ?Chief Complaint  ?Patient presents with  ? Routine Post Op  ? ? ?HPI: ?81 y.o. male presents for postop follow-up. ? ?History proximal ureteral calculus ?Ureteroscopy 04/13/2021 negative for persistent stone ?Underwent laser lithotripsy of multiple renal calculi ?Stent removed 2 days postop and no problems post stent removal ?No complaints today ? ? ?PMH: ?Past Medical History:  ?Diagnosis Date  ? Allergic state   ? Arthritis   ? BPH (benign prostatic hyperplasia)   ? Bursitis   ? hips  ? Cancer [facility])   ? basal and squamous cell  ? Cataracts, bilateral   ? ED (erectile dysfunction)   ? Elevated PSA   ? Heart murmur   ? born with mumur-asymptomatic  ? Hemorrhoids   ? History of chicken pox   ? History of colon polyps   ? History of kidney stones   ? Hypercholesteremia   ? Hypogonadism in male   ? Nephrolithiasis   ? PIN III (prostatic intraepithelial neoplasia III)   ? Spinal stenosis of lumbar region   ? Umbilical hernia   ? ? ?Surgical History: ?Past Surgical History:  ?Procedure Laterality Date  ? COLONOSCOPY WITH PROPOFOL N/A 09/21/2016  ? Procedure: COLONOSCOPY WITH PROPOFOL;  Surgeon: Devin Silvas, MD;  Location: [facility] ENDOSCOPY;  Service: Endoscopy;  Laterality: N/A;  ? CYSTOSCOPY W/ RETROGRADES  04/13/2021  ? Procedure: CYSTOSCOPY WITH RETROGRADE PYELOGRAM;  Surgeon: Devin Sons, MD;  Location: [facility] ORS;  Service: Urology;;  ? CYSTOSCOPY/URETEROSCOPY/HOLMIUM LASER/STENT PLACEMENT Right 04/13/2021  ? Procedure: CYSTOSCOPY/URETEROSCOPY/HOLMIUM LASER/STENT PLACEMENT;  Surgeon: Devin Sons, MD;  Location: [facility] ORS;  Service: Urology;  Laterality: Right;  ? EYE SURGERY Bilateral   ? cataracts  ? FRACTURE SURGERY Left   ? ORIF DISTAL RADIUS FRACTURE AND PERTROCHANTERIC FEMUR FRACTURE  ? HERNIA REPAIR Bilateral   ? age 21  ? KNEE SURGERY    ? ? ?Home  Medications:  ?Allergies as of 04/22/2021   ? ?   Reactions  ? Food Color Red Anaphylaxis  ? Other   ? Certain Red Apples causes anaphylaxis  ?Soy sauce causes headaches   ? Soy Allergy   ? Headache ?Other reaction(s): Headache  ? Cetylpyridinium   ? Unknown  ? Statins   ? Unknown tolerates rosuvastatin   ? ?  ? ?  ?Medication List  ?  ? ?  ? Accurate as of April 22, 2021 12:10 PM. If you have any questions, ask your nurse or doctor.  ?  ?  ? ?  ? ?STOP taking these medications   ? ?trospium 20 MG tablet ?Commonly known as: SANCTURA ?Stopped by: Devin Sons, MD ?  ? ?  ? ?TAKE these medications   ? ?acetaminophen 650 MG CR tablet ?Commonly known as: TYLENOL ?Take 650 mg by mouth 2 (two) times daily. ?  ?B-12 PO ?Take 1 tablet by mouth 4 (four) times a week. ?  ?CALCIUM CITRATE + D PO ?Take 1 tablet by mouth 2 (two) times daily. ?  ?cholestyramine 4 GM/DOSE powder ?Commonly known as: QUESTRAN ?Take 4 g by mouth daily as needed (diarrhea). ?  ?diphenhydrAMINE 12.5 MG/5ML liquid ?Commonly known as: BENADRYL ?Take 25 mg by mouth 4 (four) times daily as needed for allergies or sleep. ?  ?finasteride 5 MG tablet ?Commonly known as: PROSCAR ?Take 1 tablet (5  mg total) by mouth daily. ?What changed: when to take this ?  ?gabapentin 300 MG capsule ?Commonly known as: NEURONTIN ?Take 1 capsule by mouth at bedtime. ?  ?gabapentin 100 MG capsule ?Commonly known as: NEURONTIN ?2 qAM (to take in addition to the gabapentin '300mg'$  at night) ?  ?GLUCOSAMINE 1500 COMPLEX PO ?Take 1,500 mg by mouth 2 (two) times daily. ?  ?multivitamin tablet ?Take 1 tablet by mouth daily. ?  ?naproxen sodium 220 MG tablet ?Commonly known as: ALEVE ?Take 220 mg by mouth 2 (two) times daily. ?  ?oxymetazoline 0.05 % nasal spray ?Commonly known as: AFRIN ?Place 1 spray into both nostrils 2 (two) times daily as needed for congestion. ?  ?rosuvastatin 5 MG tablet ?Commonly known as: CRESTOR ?Take 5 mg by mouth every evening. ?  ?tamsulosin 0.4 MG Caps  capsule ?Commonly known as: FLOMAX ?Take 1 capsule (0.4 mg total) by mouth daily. ?What changed: when to take this ?  ?traMADol 50 MG tablet ?Commonly known as: ULTRAM ?Take 1 tablet (50 mg total) by mouth every 6 (six) hours as needed for moderate pain. ?  ?vitamin C 1000 MG tablet ?Take 1,000 mg by mouth daily. ?  ? ?  ? ? ?Allergies:  ?Allergies  ?Allergen Reactions  ? Food Color Red Anaphylaxis  ? Other   ?  Certain Red Apples causes anaphylaxis  ? ?Soy sauce causes headaches   ? Soy Allergy   ?  Headache ?Other reaction(s): Headache  ? Cetylpyridinium   ?  Unknown ?  ? Statins   ?  Unknown tolerates rosuvastatin  ?  ? ? ?Family History: ?No family history on file. ? ?Social History:  reports that he quit smoking about 42 years ago. His smoking use included pipe. He has never used smokeless tobacco. He reports that he does not drink alcohol and does not use drugs. ? ? ?Physical Exam: ?BP (!) 154/87 (BP Location: Left Arm, Patient Position: Sitting, Cuff Size: Large)   Pulse 92   Ht 6' (1.829 m)   Wt 205 lb (93 kg)   BMI 27.80 kg/m?   ?Constitutional:  Alert and oriented, No acute distress. ?Psychiatric: Normal mood and affect. ? ?Laboratory Data: ? ?Urinalysis ?Micro 6-10 WBC/3-10 RBC ? ? ? ?Assessment & Plan:   ? ?1.  Recurrent nephrolithiasis ?He was interested in pursuing a metabolic evaluation through [facility] ?63-monthfollow-up with [facility] ? ? ?SAbbie Sons MD ? ?[place]?[address] ?[place] [address]?[phone]? ?

## 2021-04-22 NOTE — Patient Instructions (Signed)
[facility] Instructions ?[facility] ?  ?You will receive a box/kit in the mail that will have a urine jug and instructions in the kit.  When the box arrives you will need to call our office [phone] to schedule a LAB appointment. ?  ?You will need to do a 24hour urine and this should be done during the days that our office will be open.  For example any day from Sunday through Thursday. ?  ?If you take Vitamin C 186m or greater please stop this 5 days prior to collection. ?  ?How to collect the urine sample: On the day you start the urine sample this 1st morning urine should NOT be collected.  For the rest of the day including all night urines should be collected.  On the next morning the 1st urine should be collected and then you will be finished with the urine collections. ?  ?You will need to bring the box with you on your LAB appointment day after urine has been collected and all instructions are complete in the box.  Your blood will be drawn and the box will be collected by our Lab employee to be sent off for analysis. ?  ?When urine and blood is complete you will need to schedule a follow up appointment for lab results.  ?

## 2021-06-08 ENCOUNTER — Other Ambulatory Visit: Payer: Self-pay | Admitting: Neurosurgery

## 2021-06-08 DIAGNOSIS — Z01818 Encounter for other preprocedural examination: Secondary | ICD-10-CM

## 2021-06-14 ENCOUNTER — Inpatient Hospital Stay: Admission: RE | Admit: 2021-06-14 | Payer: Medicare Other | Source: Ambulatory Visit

## 2021-06-15 ENCOUNTER — Ambulatory Visit: Payer: Medicare Other | Admitting: Urgent Care

## 2021-06-15 ENCOUNTER — Encounter
Admission: RE | Admit: 2021-06-15 | Discharge: 2021-06-15 | Disposition: A | Discharge status: Home / Self Care | Payer: Medicare Other | Source: Ambulatory Visit | Attending: Neurosurgery | Admitting: Neurosurgery

## 2021-06-15 ENCOUNTER — Encounter: Payer: Self-pay | Admitting: Neurosurgery

## 2021-06-15 DIAGNOSIS — Z01812 Encounter for preprocedural laboratory examination: Secondary | ICD-10-CM | POA: Insufficient documentation

## 2021-06-15 LAB — CBC
HCT: 45 % (ref 39.0–52.0)
Hemoglobin: 15.3 g/dL (ref 13.0–17.0)
MCH: 32.8 pg (ref 26.0–34.0)
MCHC: 34 g/dL (ref 30.0–36.0)
MCV: 96.6 fL (ref 80.0–100.0)
Platelets: 235 10*3/uL (ref 150–400)
RBC: 4.66 MIL/uL (ref 4.22–5.81)
RDW: 11.9 % (ref 11.5–15.5)
WBC: 5.3 10*3/uL (ref 4.0–10.5)
nRBC: 0 % (ref 0.0–0.2)

## 2021-06-15 LAB — SURGICAL PCR SCREEN
MRSA, PCR: NEGATIVE
Staphylococcus aureus: POSITIVE — AB

## 2021-06-15 LAB — BASIC METABOLIC PANEL
Anion gap: 10 (ref 5–15)
BUN: 30 mg/dL — ABNORMAL HIGH (ref 8–23)
CO2: 25 mmol/L (ref 22–32)
Calcium: 9.2 mg/dL (ref 8.9–10.3)
Chloride: 104 mmol/L (ref 98–111)
Creatinine, Ser: 1.02 mg/dL (ref 0.61–1.24)
GFR, Estimated: >60 mL/min (ref >60)
Glucose, Bld: 115 mg/dL — ABNORMAL HIGH (ref 70–99)
Potassium: 3.9 mmol/L (ref 3.5–5.1)
Sodium: 139 mmol/L (ref 135–145)

## 2021-06-15 LAB — TYPE AND SCREEN
ABO/RH(D): O POS
Antibody Screen: NEGATIVE

## 2021-06-15 NOTE — Patient Instructions (Addendum)
Your procedure is scheduled on: Monday, June 19 Report to the Registration Desk on the 1st floor of the [facility]. To find out your arrival time, please call [phone] between 1PM - 3PM on: Friday, June 16 If your arrival time is 6:00 am, do not arrive prior to that time as the [place] entrance doors do not open until 6:00 am.  REMEMBER: Instructions that are not followed completely may result in serious medical risk, up to and including death; or upon the discretion of your surgeon and anesthesiologist your surgery may need to be rescheduled.  Do not eat food after midnight the night before surgery.  No gum chewing, lozengers or hard candies.  You may however, drink CLEAR liquids up to 2 hours before you are scheduled to arrive for your surgery. Do not drink anything within 2 hours of your scheduled arrival time.  Clear liquids include: - water  - apple juice without pulp - gatorade (not RED colors) - black coffee or tea (Do NOT add milk or creamers to the coffee or tea) Do NOT drink anything that is not on this list.  TAKE THESE MEDICATIONS THE MORNING OF SURGERY WITH A SIP OF WATER:  Tylenol if needed for pain Gabapentin  One week prior to surgery: starting June 12 Stop Anti-inflammatories (NSAIDS) such as Advil, Aleve, Ibuprofen, Motrin, Naproxen, Naprosyn and Aspirin based products such as Excedrin, Goodys Powder, BC Powder. Stop ANY OVER THE COUNTER supplements until after surgery. Stop multiple vitamins, glucosamine, calcium, vitamin D, vitamin C, turmeric You may however, continue to take Tylenol if needed for pain up until the day of surgery.  No Alcohol for 24 hours before or after surgery.  No Smoking including e-cigarettes for 24 hours prior to surgery.  No chewable tobacco products for at least 6 hours prior to surgery.  No nicotine patches on the day of surgery.  Do not use any "recreational" drugs for at least a week prior to your surgery.  Please be  advised that the combination of cocaine and anesthesia may have negative outcomes, up to and including death. If you test positive for cocaine, your surgery will be cancelled.  On the morning of surgery brush your teeth with toothpaste and water, you may rinse your mouth with mouthwash if you wish. Do not swallow any toothpaste or mouthwash.  Use CHG Soap as directed on instruction sheet.  Do not wear jewelry, make-up, hairpins, clips or nail polish.  Do not wear lotions, powders, or perfumes.   Do not shave body from the neck down 48 hours prior to surgery just in case you cut yourself which could leave a site for infection.  Also, freshly shaved skin may become irritated if using the CHG soap.  Contact lenses, hearing aids and dentures may not be worn into surgery.  Do not bring valuables to the hospital. [facility] is not responsible for any missing/lost belongings or valuables.   Notify your doctor if there is any change in your medical condition (cold, fever, infection).  Wear comfortable clothing (specific to your surgery type) to the hospital.  After surgery, you can help prevent lung complications by doing breathing exercises.  Take deep breaths and cough every 1-2 hours. Your doctor may order a device called an Incentive Spirometer to help you take deep breaths.  If you are being admitted to the hospital overnight, leave your suitcase in the car. After surgery it may be brought to your room.  If you are being  discharged the day of surgery, you will not be allowed to drive home. You will need a responsible adult (18 years or older) to drive you home and stay with you that night.   If you are taking public transportation, you will need to have a responsible adult (18 years or older) with you. Please confirm with your physician that it is acceptable to use public transportation.   Please call the [place] [facility] at [phone] if you have any questions about  these instructions.  Surgery Visitation Policy:  Patients undergoing a surgery or procedure may have two family members or support persons with them as long as the person is not COVID-19 positive or experiencing its symptoms.   Inpatient Visitation:    Visiting hours are 7 a.m. to 8 p.m. Up to four visitors are allowed at one time in a patient room, including children. The visitors may rotate out with other people during the day. One designated support person (adult) may remain overnight.   Preparing for Surgery with [place] (CHG) Soap    Before surgery, you can play an important role by reducing the number of germs on your skin.  CHG (Chlorhexidine gluconate) soap is an antiseptic cleanser which kills germs and bonds with the skin to continue killing germs even after washing.  Please do not use if you have an allergy to CHG or antibacterial soaps. If your skin becomes reddened/irritated stop using the CHG.  1. Shower the NIGHT BEFORE SURGERY and the MORNING OF SURGERY with CHG soap.  2. If you choose to wash your hair, wash your hair first as usual with your normal shampoo.  3. After shampooing, rinse your hair and body thoroughly to remove the shampoo.  4. Use CHG as you would any other liquid soap. You can apply CHG directly to the skin and wash gently with a scrungie or a clean washcloth.  5. Apply the CHG soap to your body only from the neck down. Do not use on open wounds or open sores. Avoid contact with your eyes, ears, mouth, and genitals (private parts). Wash face and genitals (private parts) with your normal soap.  6. Wash thoroughly, paying special attention to the area where your surgery will be performed.  7. Thoroughly rinse your body with warm water.  8. Do not shower/wash with your normal soap after using and rinsing off the CHG soap.  9. Pat yourself dry with a clean towel.  10. Wear clean pajamas to bed the night before surgery.  12. Place  clean sheets on your bed the night of your first shower and do not sleep with pets.  13. Shower again with the CHG soap on the day of surgery prior to arriving at the hospital.  14. Do not apply any deodorants/lotions/powders.  15. Please wear clean clothes to the hospital.

## 2021-06-16 DIAGNOSIS — Z01812 Encounter for preprocedural laboratory examination: Secondary | ICD-10-CM | POA: Diagnosis not present

## 2021-06-16 LAB — URINALYSIS, COMPLETE (UACMP) WITH MICROSCOPIC
Bacteria, UA: NONE SEEN
Bilirubin Urine: NEGATIVE
Glucose, UA: NEGATIVE mg/dL
Hgb urine dipstick: NEGATIVE
Ketones, ur: NEGATIVE mg/dL
Leukocytes,Ua: NEGATIVE
Nitrite: NEGATIVE
Protein, ur: NEGATIVE mg/dL
Specific Gravity, Urine: 1.009 (ref 1.005–1.030)
pH: 6 (ref 5.0–8.0)

## 2021-06-21 ENCOUNTER — Other Ambulatory Visit: Payer: Self-pay | Admitting: Urology

## 2021-06-23 ENCOUNTER — Encounter: Payer: Self-pay | Admitting: Family Medicine

## 2021-06-23 ENCOUNTER — Other Ambulatory Visit: Payer: Self-pay | Admitting: Family Medicine

## 2021-06-23 ENCOUNTER — Ambulatory Visit: Payer: Medicare Other | Admitting: Urology

## 2021-06-23 DIAGNOSIS — R82991 Hypocitraturia: Secondary | ICD-10-CM

## 2021-06-23 DIAGNOSIS — N200 Calculus of kidney: Secondary | ICD-10-CM

## 2021-06-23 NOTE — Progress Notes (Signed)
Virtual Visit via Telephone Note  I connected with Devin Landry on 06/23/21 at  8:00 AM EDT by telephone and verified that I am speaking with the correct person using two identifiers.   Patient location: [facility], [place] Provider location: [facility] Office Participants: Patient, daughter Devin Landry and provider   I discussed the limitations, risks, security and privacy concerns of performing an evaluation and management service by telephone and the availability of in person appointments. We discussed the impact of the COVID-19 pandemic on the healthcare system, and the importance of social distancing and reducing patient and provider exposure. I also discussed with the patient that there may be a patient responsible charge related to this service. The patient expressed understanding and agreed to proceed.  Reason for visit: Metabolic stone evaluation results  History of Present Illness: 81 y.o. male with recurrent stone disease.  We discussed results of his metabolic evaluation.  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  Urine volume was excellent at 3.51 L.  Prior history calcium oxalate stones and his calcium oxalate supersaturation was low at 1.94.  Urine sodium was elevated at 257 mg  Assessment and Plan: Hypocitraturia We discussed options to increase urinary citrate levels including increasing dietary citrates; particularly lemonade made with real lemon juice.  The supplement LithoLyte was also discussed as well as potassium citrate.  He has elected the dietary citrate option. His supersaturation of calcium oxalate was very low most likely offset by his urine volume  Follow Up: Follow-up visit with [facility] April 2023 Consider repeat 24-hour urine study at that visit   I discussed the assessment and treatment plan with the patient. The patient was provided an opportunity to ask questions and all were answered. The patient agreed with the plan and  demonstrated an understanding of the instructions.   The patient was advised to call back or seek an in-person evaluation if the symptoms worsen or if the condition fails to improve as anticipated.  I provided 15 minutes of non-face-to-face time during this encounter.   Abbie Sons, MD

## 2021-06-28 ENCOUNTER — Other Ambulatory Visit: Payer: Self-pay

## 2021-06-28 ENCOUNTER — Encounter: Payer: Self-pay | Admitting: Neurosurgery

## 2021-06-28 ENCOUNTER — Encounter
Admission: RE | Disposition: A | Discharge status: Home / Self Care | Payer: Self-pay | Source: Home / Self Care | Attending: Neurosurgery

## 2021-06-28 ENCOUNTER — Ambulatory Visit
Admission: RE | Admit: 2021-06-28 | Discharge: 2021-06-28 | Disposition: A | Discharge status: Home / Self Care | Payer: Medicare Other | Attending: Neurosurgery | Admitting: Neurosurgery

## 2021-06-28 DIAGNOSIS — M48062 Spinal stenosis, lumbar region with neurogenic claudication: Secondary | ICD-10-CM | POA: Diagnosis present

## 2021-06-28 DIAGNOSIS — Z01812 Encounter for preprocedural laboratory examination: Secondary | ICD-10-CM | POA: Insufficient documentation

## 2021-06-28 LAB — ABO/RH: ABO/RH(D): O POS

## 2021-06-28 SURGERY — LUMBAR LAMINECTOMY/DECOMPRESSION MICRODISCECTOMY 4 LEVEL
Anesthesia: General

## 2021-06-28 MED ORDER — FAMOTIDINE 20 MG PO TABS
ORAL_TABLET | ORAL | Status: AC
  Filled 2021-06-28: qty 1

## 2021-06-28 MED ORDER — FAMOTIDINE 20 MG PO TABS
20.0000 mg | ORAL_TABLET | Freq: Once | ORAL | Status: AC
  Administered 2021-06-28: 20 mg via ORAL

## 2021-06-28 MED ORDER — LACTATED RINGERS IV SOLN: INTRAVENOUS | Status: DC

## 2021-06-28 MED ORDER — REMIFENTANIL HCL 1 MG IV SOLR
INTRAVENOUS | Status: AC
  Filled 2021-06-28: qty 1000

## 2021-06-28 MED ORDER — CEFAZOLIN SODIUM-DEXTROSE 2-4 GM/100ML-% IV SOLN: 2.0000 g | INTRAVENOUS | Status: DC

## 2021-06-28 MED ORDER — PROPOFOL 10 MG/ML IV BOLUS
INTRAVENOUS | Status: AC
Start: 2021-06-28 — End: ?
  Filled 2021-06-28: qty 20

## 2021-06-28 MED ORDER — PHENYLEPHRINE HCL-NACL 20-0.9 MG/250ML-% IV SOLN
INTRAVENOUS | Status: AC
  Filled 2021-06-28: qty 250

## 2021-06-28 MED ORDER — CHLORHEXIDINE GLUCONATE 0.12 % MT SOLN
OROMUCOSAL | Status: AC
  Filled 2021-06-28: qty 15

## 2021-06-28 MED ORDER — FENTANYL CITRATE (PF) 100 MCG/2ML IJ SOLN
INTRAMUSCULAR | Status: AC
  Filled 2021-06-28: qty 2

## 2021-06-28 MED ORDER — CEFAZOLIN SODIUM-DEXTROSE 2-4 GM/100ML-% IV SOLN
INTRAVENOUS | Status: AC
  Filled 2021-06-28: qty 100

## 2021-06-28 MED ORDER — PHENYLEPHRINE 80 MCG/ML (10ML) SYRINGE FOR IV PUSH (FOR BLOOD PRESSURE SUPPORT)
PREFILLED_SYRINGE | INTRAVENOUS | Status: AC
  Filled 2021-06-28: qty 10

## 2021-06-28 SURGICAL SUPPLY — 52 items
BASIN KIT SINGLE STR (MISCELLANEOUS) ×2 IMPLANT
BUR NEURO DRILL SOFT 3.0X3.8M (BURR) ×2 IMPLANT
CHLORAPREP W/TINT 26 (MISCELLANEOUS) ×4 IMPLANT
CNTNR SPEC 2.5X3XGRAD LEK (MISCELLANEOUS) ×1
CONT SPEC 4OZ STER OR WHT (MISCELLANEOUS) ×1
CONTAINER SPEC 2.5X3XGRAD LEK (MISCELLANEOUS) ×1 IMPLANT
COUNTER NEEDLE 20/40 LG (NEEDLE) ×2 IMPLANT
CUP MEDICINE 2OZ PLAST GRAD ST (MISCELLANEOUS) ×4 IMPLANT
DERMABOND ADVANCED (GAUZE/BANDAGES/DRESSINGS) ×1
DERMABOND ADVANCED .7 DNX12 (GAUZE/BANDAGES/DRESSINGS) ×1 IMPLANT
DRAPE C ARM PK CFD 31 SPINE (DRAPES) ×2 IMPLANT
DRAPE LAPAROTOMY 100X77 ABD (DRAPES) ×2 IMPLANT
DRAPE MICROSCOPE SPINE 48X150 (DRAPES) ×2 IMPLANT
DRAPE SURG 17X11 SM STRL (DRAPES) ×2 IMPLANT
ELECT CAUTERY BLADE TIP 2.5 (TIP) ×2
ELECT EZSTD 165MM 6.5IN (MISCELLANEOUS) ×2
ELECTRODE CAUTERY BLDE TIP 2.5 (TIP) ×1 IMPLANT
ELECTRODE EZSTD 165MM 6.5IN (MISCELLANEOUS) ×1 IMPLANT
GLOVE BIOGEL PI IND STRL 6.5 (GLOVE) ×1 IMPLANT
GLOVE BIOGEL PI IND STRL 8.5 (GLOVE) ×1 IMPLANT
GLOVE BIOGEL PI INDICATOR 6.5 (GLOVE) ×1
GLOVE BIOGEL PI INDICATOR 8.5 (GLOVE) ×1
GLOVE SURG SYN 6.5 ES PF (GLOVE) ×2 IMPLANT
GLOVE SURG SYN 8.5  E (GLOVE) ×3
GLOVE SURG SYN 8.5 E (GLOVE) ×3 IMPLANT
GOWN SRG LRG LVL 4 IMPRV REINF (GOWNS) ×1 IMPLANT
GOWN SRG XL LVL 3 NONREINFORCE (GOWNS) ×1 IMPLANT
GOWN STRL NON-REIN TWL XL LVL3 (GOWNS) ×1
GOWN STRL REIN LRG LVL4 (GOWNS) ×1
GOWN STRL REUS W/ TWL XL LVL3 (GOWN DISPOSABLE) ×1 IMPLANT
GOWN STRL REUS W/TWL XL LVL3 (GOWN DISPOSABLE) ×1
GRADUATE 1200CC STRL 31836 (MISCELLANEOUS) ×2 IMPLANT
KIT SPINAL PRONEVIEW (KITS) ×2 IMPLANT
MANIFOLD NEPTUNE II (INSTRUMENTS) ×2 IMPLANT
MARKER SKIN DUAL TIP RULER LAB (MISCELLANEOUS) ×2 IMPLANT
NDL SAFETY ECLIPSE 18X1.5 (NEEDLE) IMPLANT
NEEDLE HYPO 18GX1.5 SHARP (NEEDLE)
NEEDLE HYPO 22GX1.5 SAFETY (NEEDLE) ×2 IMPLANT
NS IRRIG 1000ML POUR BTL (IV SOLUTION) ×2 IMPLANT
PACK LAMINECTOMY NEURO (CUSTOM PROCEDURE TRAY) ×2 IMPLANT
PAD ARMBOARD 7.5X6 YLW CONV (MISCELLANEOUS) ×2 IMPLANT
SOLUTION IRRIG SURGIPHOR (IV SOLUTION) ×2 IMPLANT
SURGIFLO W/THROMBIN 8M KIT (HEMOSTASIS) ×2 IMPLANT
SUT DVC VLOC 3-0 CL 6 P-12 (SUTURE) ×2 IMPLANT
SUT VIC AB 0 CT1 27 (SUTURE) ×1
SUT VIC AB 0 CT1 27XCR 8 STRN (SUTURE) ×1 IMPLANT
SUT VIC AB 2-0 CT1 18 (SUTURE) ×2 IMPLANT
SYR 20ML LL LF (SYRINGE) ×2 IMPLANT
SYR 30ML LL (SYRINGE) ×4 IMPLANT
SYR 3ML LL SCALE MARK (SYRINGE) ×2 IMPLANT
TOWEL OR 17X26 4PK STRL BLUE (TOWEL DISPOSABLE) ×6 IMPLANT
TUBING CONNECTING 10 (TUBING) ×2 IMPLANT

## 2021-06-28 NOTE — Progress Notes (Signed)
Patient rescheduled per Dr. Izora Ribas.

## 2021-06-28 NOTE — Anesthesia Preprocedure Evaluation (Deleted)
Anesthesia Evaluation  Patient identified by MRN, date of birth, ID band Patient awake    Reviewed: Allergy & Precautions, NPO status , Patient's Chart, lab work & pertinent test results  History of Anesthesia Complications Negative for: history of anesthetic complications  Airway Mallampati: IV   Neck ROM: Full    Dental  (+) Missing, Implants, Poor Dentition, Dental Advidsory Given   Pulmonary neg shortness of breath, asthma , neg recent URI, former smoker,    Pulmonary exam normal breath sounds clear to auscultation       Cardiovascular Exercise Tolerance: Good (-) hypertension(-) angina(-) Past MI and (-) Cardiac Stents Normal cardiovascular exam(-) dysrhythmias + Valvular Problems/Murmurs  Rhythm:Regular Rate:Normal  ECG 04/09/21: normal   Neuro/Psych neg Seizures HOH  Neuromuscular disease (Spinal stenosis)    GI/Hepatic negative GI ROS, Neg liver ROS,   Endo/Other  negative endocrine ROS  Renal/GU Renal disease (nephrolithiasis)   BPH    Musculoskeletal  (+) Arthritis ,   Abdominal   Peds  Hematology negative hematology ROS (+)   Anesthesia Other Findings Past Medical History: No date: Allergic state No date: Arthritis No date: BPH (benign prostatic hyperplasia) No date: Bursitis     Comment:  hips No date: Cancer (HCC)     Comment:  basal and squamous cell No date: Cataracts, bilateral No date: ED (erectile dysfunction) No date: Elevated PSA No date: Heart murmur     Comment:  born with mumur-asymptomatic No date: Hemorrhoids No date: History of chicken pox No date: History of colon polyps No date: History of kidney stones No date: Hypercholesteremia No date: Hypogonadism in male No date: Nephrolithiasis No date: PIN III (prostatic intraepithelial neoplasia III) No date: Spinal stenosis of lumbar region No date: Umbilical hernia   Reproductive/Obstetrics                              Anesthesia Physical  Anesthesia Plan  ASA: 2  Anesthesia Plan: General   Post-op Pain Management:    Induction: Intravenous  PONV Risk Score and Plan: 2 and Ondansetron, Dexamethasone and Treatment may vary due to age or medical condition  Airway Management Planned: Oral ETT  Additional Equipment:   Intra-op Plan:   Post-operative Plan: Extubation in OR  Informed Consent: I have reviewed the patients History and Physical, chart, labs and discussed the procedure including the risks, benefits and alternatives for the proposed anesthesia with the patient or authorized representative who has indicated his/her understanding and acceptance.     Dental advisory given  Plan Discussed with: CRNA  Anesthesia Plan Comments: (Patient consented for risks of anesthesia including but not limited to:  - adverse reactions to medications - damage to eyes, teeth, lips or other oral mucosa - nerve damage due to positioning  - sore throat or hoarseness - damage to heart, brain, nerves, lungs, other parts of body or loss of life  Informed patient about role of CRNA in peri- and intra-operative care.  Patient voiced understanding.)        Anesthesia Quick Evaluation

## 2021-06-28 NOTE — Progress Notes (Signed)
PHARMACY -  BRIEF ANTIBIOTIC NOTE   Pharmacy has received consult(s) for Cefazolin from an OR provider.  The patient's profile has been reviewed for ht/wt/allergies/indication/available labs.    One time order(s) placed for Cefazolin 2 gm  Further antibiotics/pharmacy consults should be ordered by admitting physician if indicated.                       Thank you, Renda Rolls, PharmD, [facility] 06/28/2021 1:18 AM

## 2021-07-01 ENCOUNTER — Other Ambulatory Visit: Payer: Self-pay | Admitting: Neurosurgery

## 2021-07-01 DIAGNOSIS — Z01818 Encounter for other preprocedural examination: Secondary | ICD-10-CM

## 2021-07-01 MED ORDER — LACTATED RINGERS IV SOLN: INTRAVENOUS | Status: DC

## 2021-07-01 MED ORDER — CEFAZOLIN SODIUM-DEXTROSE 2-4 GM/100ML-% IV SOLN
2.0000 g | INTRAVENOUS | Status: AC
  Administered 2021-07-02: 2 g via INTRAVENOUS

## 2021-07-01 MED ORDER — CHLORHEXIDINE GLUCONATE 0.12 % MT SOLN: 15.0000 mL | Freq: Once | OROMUCOSAL | Status: AC

## 2021-07-01 MED ORDER — ORAL CARE MOUTH RINSE: 15.0000 mL | Freq: Once | OROMUCOSAL | Status: AC

## 2021-07-01 NOTE — Progress Notes (Signed)
Pharmacy Antibiotic Note  Devin Landry is a 81 y.o. male admitted on (Not on file) with surgical prophylaxis.  Pharmacy has been consulted for Cefazolin dosing.  Plan: TBW (06/15/2021) = 96.9 kg  Cefazolin 2 gm IV 60 min pre-op ordered to start on 6/23 @ 0500.      No data recorded.  No results for input(s): "WBC", "CREATININE", "LATICACIDVEN", "VANCOTROUGH", "VANCOPEAK", "VANCORANDOM", "GENTTROUGH", "GENTPEAK", "GENTRANDOM", "TOBRATROUGH", "TOBRAPEAK", "TOBRARND", "AMIKACINPEAK", "AMIKACINTROU", "AMIKACIN" in the last 168 hours.  CrCl cannot be calculated (Unknown ideal weight.).    Allergies  Allergen Reactions   Other     Red Delicious Apple skins causes anaphylaxis     Soy Allergy     Headache Other reaction(s): Headache   Zocor [Simvastatin] Other (See Comments)    Leg pain    Antimicrobials this admission:   >>    >>   Dose adjustments this admission:   Microbiology results:  BCx:   UCx:    Sputum:    MRSA PCR:   Thank you for allowing pharmacy to be a part of this patient's care.  Kimothy Kishimoto D 07/01/2021 10:33 PM

## 2021-07-02 ENCOUNTER — Observation Stay
Admission: RE | Admit: 2021-07-02 | Discharge: 2021-07-04 | Disposition: A | Discharge status: Home Health | Payer: Medicare Other | Attending: Neurosurgery | Admitting: Neurosurgery

## 2021-07-02 ENCOUNTER — Encounter: Payer: Self-pay | Admitting: Neurosurgery

## 2021-07-02 ENCOUNTER — Ambulatory Visit: Payer: Medicare Other

## 2021-07-02 ENCOUNTER — Ambulatory Visit: Payer: Medicare Other | Admitting: Anesthesiology

## 2021-07-02 ENCOUNTER — Other Ambulatory Visit: Payer: Self-pay

## 2021-07-02 ENCOUNTER — Encounter
Admission: RE | Disposition: A | Discharge status: Home Health | Payer: Self-pay | Source: Home / Self Care | Attending: Neurosurgery

## 2021-07-02 DIAGNOSIS — M48062 Spinal stenosis, lumbar region with neurogenic claudication: Secondary | ICD-10-CM | POA: Diagnosis present

## 2021-07-02 DIAGNOSIS — Z9889 Other specified postprocedural states: Secondary | ICD-10-CM

## 2021-07-02 DIAGNOSIS — Z419 Encounter for procedure for purposes other than remedying health state, unspecified: Secondary | ICD-10-CM

## 2021-07-02 HISTORY — PX: LUMBAR LAMINECTOMY/DECOMPRESSION MICRODISCECTOMY: SHX5026

## 2021-07-02 LAB — TYPE AND SCREEN
ABO/RH(D): O POS
Antibody Screen: NEGATIVE

## 2021-07-02 SURGERY — LUMBAR LAMINECTOMY/DECOMPRESSION MICRODISCECTOMY 4 LEVEL
Anesthesia: General | Site: Spine Lumbar

## 2021-07-02 MED ORDER — PROPOFOL 10 MG/ML IV BOLUS
INTRAVENOUS | Status: AC
  Filled 2021-07-02: qty 60

## 2021-07-02 MED ORDER — ONDANSETRON HCL 4 MG PO TABS: 4.0000 mg | ORAL_TABLET | Freq: Four times a day (QID) | ORAL | Status: DC | PRN

## 2021-07-02 MED ORDER — TAMSULOSIN HCL 0.4 MG PO CAPS
0.4000 mg | ORAL_CAPSULE | Freq: Every evening | ORAL | Status: DC
Start: 2021-07-02 — End: 2021-07-04
  Administered 2021-07-02 – 2021-07-03 (×2): 0.4 mg via ORAL
  Filled 2021-07-02 (×2): qty 1

## 2021-07-02 MED ORDER — SENNOSIDES-DOCUSATE SODIUM 8.6-50 MG PO TABS: 1.0000 | ORAL_TABLET | Freq: Every evening | ORAL | Status: DC | PRN

## 2021-07-02 MED ORDER — PHENYLEPHRINE HCL-NACL 20-0.9 MG/250ML-% IV SOLN
INTRAVENOUS | Status: AC
  Filled 2021-07-02: qty 250

## 2021-07-02 MED ORDER — PROPOFOL 10 MG/ML IV BOLUS
INTRAVENOUS | Status: DC | PRN
  Administered 2021-07-02: 150 mg via INTRAVENOUS
  Administered 2021-07-02: 50 mg via INTRAVENOUS

## 2021-07-02 MED ORDER — BUPIVACAINE-EPINEPHRINE (PF) 0.5% -1:200000 IJ SOLN
INTRAMUSCULAR | Status: DC | PRN
  Administered 2021-07-02: 5 mL

## 2021-07-02 MED ORDER — BISACODYL 10 MG RE SUPP: 10.0000 mg | Freq: Every day | RECTAL | Status: DC | PRN

## 2021-07-02 MED ORDER — CEFAZOLIN SODIUM-DEXTROSE 2-4 GM/100ML-% IV SOLN
INTRAVENOUS | Status: AC
  Filled 2021-07-02: qty 100

## 2021-07-02 MED ORDER — PHENYLEPHRINE 80 MCG/ML (10ML) SYRINGE FOR IV PUSH (FOR BLOOD PRESSURE SUPPORT)
PREFILLED_SYRINGE | INTRAVENOUS | Status: DC | PRN
  Administered 2021-07-02: 80 ug via INTRAVENOUS
  Administered 2021-07-02: 160 ug via INTRAVENOUS
  Administered 2021-07-02: 80 ug via INTRAVENOUS

## 2021-07-02 MED ORDER — ACETAMINOPHEN 10 MG/ML IV SOLN
INTRAVENOUS | Status: DC | PRN
  Administered 2021-07-02: 1000 mg via INTRAVENOUS

## 2021-07-02 MED ORDER — EPHEDRINE SULFATE (PRESSORS) 50 MG/ML IJ SOLN
INTRAMUSCULAR | Status: DC | PRN
  Administered 2021-07-02: 10 mg via INTRAVENOUS
  Administered 2021-07-02: 5 mg via INTRAVENOUS

## 2021-07-02 MED ORDER — FIBRIN SEALANT 2 ML SINGLE DOSE KIT
PACK | CUTANEOUS | Status: DC | PRN
  Administered 2021-07-02: 2 mL via TOPICAL

## 2021-07-02 MED ORDER — ROSUVASTATIN CALCIUM 5 MG PO TABS
5.0000 mg | ORAL_TABLET | Freq: Every evening | ORAL | Status: DC
Start: 2021-07-02 — End: 2021-07-04
  Administered 2021-07-02: 5 mg via ORAL
  Filled 2021-07-02: qty 1

## 2021-07-02 MED ORDER — FAMOTIDINE 20 MG PO TABS
ORAL_TABLET | ORAL | Status: AC
  Administered 2021-07-02: 20 mg via ORAL
  Filled 2021-07-02: qty 1

## 2021-07-02 MED ORDER — OXYCODONE HCL 5 MG PO TABS
5.0000 mg | ORAL_TABLET | ORAL | Status: DC | PRN
  Administered 2021-07-03 – 2021-07-04 (×4): 5 mg via ORAL
  Filled 2021-07-02 (×4): qty 1

## 2021-07-02 MED ORDER — KETOROLAC TROMETHAMINE 15 MG/ML IJ SOLN
7.5000 mg | Freq: Four times a day (QID) | INTRAMUSCULAR | Status: AC
  Administered 2021-07-02 – 2021-07-03 (×4): 7.5 mg via INTRAVENOUS
  Filled 2021-07-02 (×4): qty 1

## 2021-07-02 MED ORDER — GLYCOPYRROLATE 0.2 MG/ML IJ SOLN
INTRAMUSCULAR | Status: DC | PRN
  Administered 2021-07-02: .2 mg via INTRAVENOUS

## 2021-07-02 MED ORDER — SODIUM CHLORIDE 0.9 % IV SOLN: 250.0000 mL | INTRAVENOUS | Status: DC

## 2021-07-02 MED ORDER — CELECOXIB 100 MG PO CAPS
100.0000 mg | ORAL_CAPSULE | Freq: Two times a day (BID) | ORAL | Status: DC
  Administered 2021-07-03 – 2021-07-04 (×3): 100 mg via ORAL
  Filled 2021-07-02 (×3): qty 1

## 2021-07-02 MED ORDER — METHOCARBAMOL 1000 MG/10ML IJ SOLN
500.0000 mg | Freq: Four times a day (QID) | INTRAVENOUS | Status: DC | PRN
  Administered 2021-07-02: 500 mg via INTRAVENOUS
  Filled 2021-07-02: qty 500

## 2021-07-02 MED ORDER — BUPIVACAINE HCL (PF) 0.5 % IJ SOLN
INTRAMUSCULAR | Status: AC
  Filled 2021-07-02: qty 30

## 2021-07-02 MED ORDER — FENTANYL CITRATE (PF) 100 MCG/2ML IJ SOLN
INTRAMUSCULAR | Status: AC
  Filled 2021-07-02: qty 2

## 2021-07-02 MED ORDER — ACETAMINOPHEN 10 MG/ML IV SOLN: 1000.0000 mg | Freq: Once | INTRAVENOUS | Status: DC | PRN

## 2021-07-02 MED ORDER — FINASTERIDE 5 MG PO TABS
5.0000 mg | ORAL_TABLET | Freq: Every day | ORAL | Status: DC
  Administered 2021-07-02 – 2021-07-03 (×2): 5 mg via ORAL
  Filled 2021-07-02 (×2): qty 1

## 2021-07-02 MED ORDER — REMIFENTANIL HCL 1 MG IV SOLR
INTRAVENOUS | Status: DC | PRN
  Administered 2021-07-02: .1 ug/kg/min via INTRAVENOUS

## 2021-07-02 MED ORDER — FENTANYL CITRATE (PF) 100 MCG/2ML IJ SOLN: 25.0000 ug | INTRAMUSCULAR | Status: DC | PRN

## 2021-07-02 MED ORDER — MORPHINE SULFATE (PF) 2 MG/ML IV SOLN: 1.0000 mg | INTRAVENOUS | Status: DC | PRN

## 2021-07-02 MED ORDER — ONDANSETRON HCL 4 MG/2ML IJ SOLN: 4.0000 mg | Freq: Four times a day (QID) | INTRAMUSCULAR | Status: DC | PRN

## 2021-07-02 MED ORDER — CHLORHEXIDINE GLUCONATE 0.12 % MT SOLN
OROMUCOSAL | Status: AC
  Administered 2021-07-02: 15 mL via OROMUCOSAL
  Filled 2021-07-02: qty 15

## 2021-07-02 MED ORDER — DEXAMETHASONE SODIUM PHOSPHATE 10 MG/ML IJ SOLN
INTRAMUSCULAR | Status: DC | PRN
  Administered 2021-07-02: 8 mg via INTRAVENOUS

## 2021-07-02 MED ORDER — DOCUSATE SODIUM 100 MG PO CAPS
100.0000 mg | ORAL_CAPSULE | Freq: Two times a day (BID) | ORAL | Status: DC
  Administered 2021-07-02 – 2021-07-04 (×4): 100 mg via ORAL
  Filled 2021-07-02 (×4): qty 1

## 2021-07-02 MED ORDER — PHENOL 1.4 % MT LIQD: 1.0000 | OROMUCOSAL | Status: DC | PRN

## 2021-07-02 MED ORDER — 0.9 % SODIUM CHLORIDE (POUR BTL) OPTIME
TOPICAL | Status: DC | PRN
  Administered 2021-07-02: 1000 mL

## 2021-07-02 MED ORDER — KETOROLAC TROMETHAMINE 30 MG/ML IJ SOLN
INTRAMUSCULAR | Status: DC | PRN
  Administered 2021-07-02: 15 mg via INTRAVENOUS

## 2021-07-02 MED ORDER — CHOLESTYRAMINE 4 G PO PACK: 4.0000 g | PACK | Freq: Every day | ORAL | Status: DC | PRN

## 2021-07-02 MED ORDER — LIDOCAINE HCL (CARDIAC) PF 100 MG/5ML IV SOSY
PREFILLED_SYRINGE | INTRAVENOUS | Status: DC | PRN
  Administered 2021-07-02: 100 mg via INTRAVENOUS

## 2021-07-02 MED ORDER — ACETAMINOPHEN 500 MG PO TABS
1000.0000 mg | ORAL_TABLET | Freq: Four times a day (QID) | ORAL | Status: DC
  Administered 2021-07-02 – 2021-07-04 (×7): 1000 mg via ORAL
  Filled 2021-07-02 (×8): qty 2

## 2021-07-02 MED ORDER — SODIUM CHLORIDE 0.9 % IV SOLN: INTRAVENOUS | Status: DC

## 2021-07-02 MED ORDER — BUPIVACAINE-EPINEPHRINE (PF) 0.5% -1:200000 IJ SOLN
INTRAMUSCULAR | Status: AC
  Filled 2021-07-02: qty 30

## 2021-07-02 MED ORDER — PROPOFOL 10 MG/ML IV BOLUS
INTRAVENOUS | Status: AC
  Filled 2021-07-02: qty 20

## 2021-07-02 MED ORDER — METHYLPREDNISOLONE ACETATE 40 MG/ML IJ SUSP
INTRAMUSCULAR | Status: DC | PRN
  Administered 2021-07-02: 40 mg

## 2021-07-02 MED ORDER — GABAPENTIN 300 MG PO CAPS
300.0000 mg | ORAL_CAPSULE | Freq: Every day | ORAL | Status: DC
  Administered 2021-07-02 – 2021-07-03 (×2): 300 mg via ORAL
  Filled 2021-07-02 (×2): qty 1

## 2021-07-02 MED ORDER — ENOXAPARIN SODIUM 40 MG/0.4ML IJ SOSY
40.0000 mg | PREFILLED_SYRINGE | INTRAMUSCULAR | Status: DC
  Administered 2021-07-03: 40 mg via SUBCUTANEOUS
  Filled 2021-07-02 (×2): qty 0.4

## 2021-07-02 MED ORDER — FENTANYL CITRATE (PF) 100 MCG/2ML IJ SOLN
INTRAMUSCULAR | Status: DC | PRN
Start: 2021-07-02 — End: 2021-07-02
  Administered 2021-07-02: 50 ug via INTRAVENOUS
  Administered 2021-07-02: 25 ug via INTRAVENOUS
  Administered 2021-07-02: 50 ug via INTRAVENOUS

## 2021-07-02 MED ORDER — SENNA 8.6 MG PO TABS
1.0000 | ORAL_TABLET | Freq: Two times a day (BID) | ORAL | Status: DC
  Administered 2021-07-02 – 2021-07-04 (×4): 8.6 mg via ORAL
  Filled 2021-07-02 (×4): qty 1

## 2021-07-02 MED ORDER — OXYCODONE HCL 5 MG PO TABS: 5.0000 mg | ORAL_TABLET | Freq: Once | ORAL | Status: DC | PRN

## 2021-07-02 MED ORDER — GABAPENTIN 100 MG PO CAPS
200.0000 mg | ORAL_CAPSULE | Freq: Every day | ORAL | Status: DC
  Administered 2021-07-03 – 2021-07-04 (×2): 200 mg via ORAL
  Filled 2021-07-02 (×2): qty 2

## 2021-07-02 MED ORDER — PHENYLEPHRINE HCL-NACL 20-0.9 MG/250ML-% IV SOLN
INTRAVENOUS | Status: DC | PRN
  Administered 2021-07-02: 40 ug/min via INTRAVENOUS

## 2021-07-02 MED ORDER — SODIUM CHLORIDE 0.9% FLUSH
3.0000 mL | Freq: Two times a day (BID) | INTRAVENOUS | Status: DC
  Administered 2021-07-02 – 2021-07-04 (×3): 3 mL via INTRAVENOUS

## 2021-07-02 MED ORDER — OXYCODONE HCL 5 MG/5ML PO SOLN: 5.0000 mg | Freq: Once | ORAL | Status: DC | PRN

## 2021-07-02 MED ORDER — SURGIFLO WITH THROMBIN (HEMOSTATIC MATRIX KIT) OPTIME
TOPICAL | Status: DC | PRN
  Administered 2021-07-02: 1 via TOPICAL

## 2021-07-02 MED ORDER — BUPIVACAINE LIPOSOME 1.3 % IJ SUSP
INTRAMUSCULAR | Status: AC
  Filled 2021-07-02: qty 20

## 2021-07-02 MED ORDER — METHYLPREDNISOLONE ACETATE 40 MG/ML IJ SUSP
INTRAMUSCULAR | Status: AC
  Filled 2021-07-02: qty 1

## 2021-07-02 MED ORDER — OXYCODONE HCL 5 MG PO TABS: 10.0000 mg | ORAL_TABLET | ORAL | Status: DC | PRN

## 2021-07-02 MED ORDER — ONDANSETRON HCL 4 MG/2ML IJ SOLN
INTRAMUSCULAR | Status: DC | PRN
  Administered 2021-07-02: 4 mg via INTRAVENOUS

## 2021-07-02 MED ORDER — REMIFENTANIL HCL 1 MG IV SOLR
INTRAVENOUS | Status: AC
  Filled 2021-07-02: qty 1000

## 2021-07-02 MED ORDER — FAMOTIDINE 20 MG PO TABS: 20.0000 mg | ORAL_TABLET | Freq: Once | ORAL | Status: AC

## 2021-07-02 MED ORDER — METHOCARBAMOL 500 MG PO TABS
500.0000 mg | ORAL_TABLET | Freq: Four times a day (QID) | ORAL | Status: DC | PRN
  Administered 2021-07-02 – 2021-07-04 (×6): 500 mg via ORAL
  Filled 2021-07-02 (×6): qty 1

## 2021-07-02 MED ORDER — FLEET ENEMA 7-19 GM/118ML RE ENEM: 1.0000 | ENEMA | Freq: Once | RECTAL | Status: DC | PRN

## 2021-07-02 MED ORDER — ACETAMINOPHEN 10 MG/ML IV SOLN
INTRAVENOUS | Status: AC
  Filled 2021-07-02: qty 100

## 2021-07-02 MED ORDER — SODIUM CHLORIDE 0.9% FLUSH
3.0000 mL | INTRAVENOUS | Status: DC | PRN
Start: 2021-07-02 — End: 2021-07-04

## 2021-07-02 MED ORDER — SODIUM CHLORIDE FLUSH 0.9 % IV SOLN
INTRAVENOUS | Status: AC
  Filled 2021-07-02: qty 20

## 2021-07-02 MED ORDER — LACTATED RINGERS IV SOLN: INTRAVENOUS | Status: DC

## 2021-07-02 MED ORDER — ONDANSETRON HCL 4 MG/2ML IJ SOLN: 4.0000 mg | Freq: Once | INTRAMUSCULAR | Status: DC | PRN

## 2021-07-02 MED ORDER — SODIUM CHLORIDE (PF) 0.9 % IJ SOLN
INTRAMUSCULAR | Status: DC | PRN
  Administered 2021-07-02: 60 mL via INTRAMUSCULAR

## 2021-07-02 MED ORDER — STERILE WATER FOR IRRIGATION IR SOLN
Status: DC | PRN
  Administered 2021-07-02: 1000 mL

## 2021-07-02 MED ORDER — MENTHOL 3 MG MT LOZG: 1.0000 | LOZENGE | OROMUCOSAL | Status: DC | PRN

## 2021-07-02 MED ORDER — SUCCINYLCHOLINE CHLORIDE 200 MG/10ML IV SOSY
PREFILLED_SYRINGE | INTRAVENOUS | Status: DC | PRN
  Administered 2021-07-02: 100 mg via INTRAVENOUS

## 2021-07-02 SURGICAL SUPPLY — 59 items
BASIN KIT SINGLE STR (MISCELLANEOUS) ×2 IMPLANT
BUR NEURO DRILL SOFT 3.0X3.8M (BURR) ×2 IMPLANT
CHLORAPREP W/TINT 26 (MISCELLANEOUS) ×4 IMPLANT
CNTNR SPEC 2.5X3XGRAD LEK (MISCELLANEOUS) ×1
CONT SPEC 4OZ STER OR WHT (MISCELLANEOUS) ×1
CONT SPEC 4OZ STRL OR WHT (MISCELLANEOUS) ×1
CONTAINER SPEC 2.5X3XGRAD LEK (MISCELLANEOUS) ×1 IMPLANT
COUNTER NEEDLE 20/40 LG (NEEDLE) ×2 IMPLANT
CUP MEDICINE 2OZ PLAST GRAD ST (MISCELLANEOUS) ×4 IMPLANT
DERMABOND ADVANCED (GAUZE/BANDAGES/DRESSINGS) ×1
DERMABOND ADVANCED .7 DNX12 (GAUZE/BANDAGES/DRESSINGS) ×1 IMPLANT
DRAPE C ARM PK CFD 31 SPINE (DRAPES) ×2 IMPLANT
DRAPE LAPAROTOMY 100X77 ABD (DRAPES) ×2 IMPLANT
DRAPE MICROSCOPE SPINE 48X150 (DRAPES) ×2 IMPLANT
DRAPE SURG 17X11 SM STRL (DRAPES) ×2 IMPLANT
DRSG OPSITE POSTOP 4X8 (GAUZE/BANDAGES/DRESSINGS) ×1 IMPLANT
ELECT CAUTERY BLADE TIP 2.5 (TIP) ×2
ELECT EZSTD 165MM 6.5IN (MISCELLANEOUS) ×2
ELECTRODE CAUTERY BLDE TIP 2.5 (TIP) ×1 IMPLANT
ELECTRODE EZSTD 165MM 6.5IN (MISCELLANEOUS) ×1 IMPLANT
GLOVE BIOGEL PI IND STRL 6.5 (GLOVE) ×1 IMPLANT
GLOVE BIOGEL PI IND STRL 8.5 (GLOVE) ×1 IMPLANT
GLOVE BIOGEL PI INDICATOR 6.5 (GLOVE) ×1
GLOVE BIOGEL PI INDICATOR 8.5 (GLOVE) ×1
GLOVE SURG SYN 6.5 ES PF (GLOVE) ×2 IMPLANT
GLOVE SURG SYN 6.5 PF PI (GLOVE) ×1 IMPLANT
GLOVE SURG SYN 8.5  E (GLOVE) ×6
GLOVE SURG SYN 8.5 E (GLOVE) ×3 IMPLANT
GLOVE SURG SYN 8.5 PF PI (GLOVE) ×3 IMPLANT
GOWN SRG LRG LVL 4 IMPRV REINF (GOWNS) ×1 IMPLANT
GOWN SRG XL LVL 3 NONREINFORCE (GOWNS) ×1 IMPLANT
GOWN STRL NON-REIN TWL XL LVL3 (GOWNS) ×2
GOWN STRL REIN LRG LVL4 (GOWNS) ×2
GOWN STRL REUS W/ TWL XL LVL3 (GOWN DISPOSABLE) ×1 IMPLANT
GOWN STRL REUS W/TWL XL LVL3 (GOWN DISPOSABLE) ×2
GRADUATE 1200CC STRL 31836 (MISCELLANEOUS) ×2 IMPLANT
GRAFT DURAGEN MATRIX 1WX1L (Tissue) ×1 IMPLANT
KIT SPINAL PRONEVIEW (KITS) ×2 IMPLANT
MANIFOLD NEPTUNE II (INSTRUMENTS) ×2 IMPLANT
MARKER SKIN DUAL TIP RULER LAB (MISCELLANEOUS) ×2 IMPLANT
NDL SAFETY ECLIPSE 18X1.5 (NEEDLE) IMPLANT
NEEDLE HYPO 18GX1.5 SHARP (NEEDLE)
NEEDLE HYPO 22GX1.5 SAFETY (NEEDLE) ×2 IMPLANT
NS IRRIG 1000ML POUR BTL (IV SOLUTION) ×2 IMPLANT
PACK LAMINECTOMY NEURO (CUSTOM PROCEDURE TRAY) ×2 IMPLANT
PAD ARMBOARD 7.5X6 YLW CONV (MISCELLANEOUS) ×2 IMPLANT
SURGIFLO W/THROMBIN 8M KIT (HEMOSTASIS) ×2 IMPLANT
SUT DVC VLOC 3-0 CL 6 P-12 (SUTURE) ×2 IMPLANT
SUT ETHILON 3-0 FS-10 30 BLK (SUTURE) ×2
SUT VIC AB 0 CT1 27 (SUTURE) ×4
SUT VIC AB 0 CT1 27XCR 8 STRN (SUTURE) ×1 IMPLANT
SUT VIC AB 2-0 CT1 18 (SUTURE) ×3 IMPLANT
SUTURE EHLN 3-0 FS-10 30 BLK (SUTURE) IMPLANT
SYR 20ML LL LF (SYRINGE) ×2 IMPLANT
SYR 30ML LL (SYRINGE) ×4 IMPLANT
SYR 3ML LL SCALE MARK (SYRINGE) ×2 IMPLANT
TOWEL OR 17X26 4PK STRL BLUE (TOWEL DISPOSABLE) ×6 IMPLANT
TUBING CONNECTING 10 (TUBING) ×2 IMPLANT
WATER STERILE IRR 1000ML POUR (IV SOLUTION) ×3 IMPLANT

## 2021-07-02 NOTE — Transfer of Care (Signed)
Immediate Anesthesia Transfer of Care Note  Patient: ERUBIEL SCHMAHL  Procedure(s) Performed: L2-S1 DECOMPRESSION (Spine Lumbar)  Patient Location: PACU  Anesthesia Type:General  Level of Consciousness: awake, drowsy and patient cooperative  Airway & Oxygen Therapy: Patient Spontanous Breathing and Patient connected to face mask oxygen  Post-op Assessment: Report given to RN and Post -op Vital signs reviewed and stable  Post vital signs: Reviewed and stable  Last Vitals:  Vitals Value Taken Time  BP 162/96 07/02/21 1111  Temp 36.6 C 07/02/21 1109  Pulse 104 07/02/21 1115  Resp 14 07/02/21 1115  SpO2 98 % 07/02/21 1115  Vitals shown include unvalidated device data.  Last Pain:  Vitals:   07/02/21 1109  TempSrc:   PainSc: Asleep         Complications: No notable events documented.

## 2021-07-02 NOTE — Anesthesia Postprocedure Evaluation (Signed)
Anesthesia Post Note  Patient: Devin Landry  Procedure(s) Performed: L2-S1 DECOMPRESSION (Spine Lumbar)  Patient location during evaluation: PACU Anesthesia Type: General Level of consciousness: awake and alert Pain management: pain level controlled Vital Signs Assessment: post-procedure vital signs reviewed and stable Respiratory status: spontaneous breathing, nonlabored ventilation, respiratory function stable and patient connected to nasal cannula oxygen Cardiovascular status: blood pressure returned to baseline and stable Postop Assessment: no apparent nausea or vomiting Anesthetic complications: no   No notable events documented.   Last Vitals:  Vitals:   07/02/21 1221 07/02/21 1354  BP: 136/81 (!) 143/85  Pulse: 96 93  Resp: 18 16  Temp: 36.7 C 36.6 C  SpO2: 92% 97%    Last Pain:  Vitals:   07/02/21 1354  TempSrc: Oral  PainSc:                  Lenard Simmer

## 2021-07-03 DIAGNOSIS — M48062 Spinal stenosis, lumbar region with neurogenic claudication: Secondary | ICD-10-CM | POA: Diagnosis not present

## 2021-07-03 MED ORDER — OXYCODONE HCL 5 MG PO TABS: 5.0000 mg | ORAL_TABLET | ORAL | 0 refills | Status: DC | PRN

## 2021-07-03 MED ORDER — METHOCARBAMOL 500 MG PO TABS: 500.0000 mg | ORAL_TABLET | Freq: Four times a day (QID) | ORAL | 0 refills | Status: DC | PRN

## 2021-07-03 NOTE — Progress Notes (Addendum)
1354 D/c instructions reviewed with pt and daughter (beth). All questions and concerns answered. Walker delivered. Pt still needs to void prior to being d/c pt and daughter verbalize understanding .   1502 Pt still attempting to void.  1603 DR Abd Maryln Manuel aware that pt voided but bladder scan shows 634pm. No orders for I&O cath at this time. Pt usually voids in evening. Verbal orders for pt to d/c if after voiding post void is less than . Nurse will update pt and and daughter  1643 Pt voided post bladder scan shows . Will notify Dr Emogene Morgan.  1754 Dr Emogene Morgan aware that pt does not want to be straight cath at this time and wants to cont. To void on his own. Pt will not d/c until bladder scan is under .  1845 DR Emogene Morgan aware that pt bladder scan shows . Verbal order with readback for one time order for straight cath.Will monitor overnight pt will not being d/c home tonight. Pt and daughter Waynetta Sandy) aware.

## 2021-07-04 DIAGNOSIS — M48062 Spinal stenosis, lumbar region with neurogenic claudication: Secondary | ICD-10-CM | POA: Diagnosis not present

## 2021-07-14 ENCOUNTER — Other Ambulatory Visit: Payer: Self-pay

## 2021-07-14 ENCOUNTER — Emergency Department
Admission: EM | Admit: 2021-07-14 | Discharge: 2021-07-14 | Discharge status: Against Medical Advice | Payer: Medicare Other | Attending: Student | Admitting: Student

## 2021-07-14 DIAGNOSIS — K625 Hemorrhage of anus and rectum: Secondary | ICD-10-CM | POA: Insufficient documentation

## 2021-07-14 DIAGNOSIS — J45909 Unspecified asthma, uncomplicated: Secondary | ICD-10-CM | POA: Insufficient documentation

## 2021-07-14 DIAGNOSIS — Z5321 Procedure and treatment not carried out due to patient leaving prior to being seen by health care provider: Secondary | ICD-10-CM | POA: Insufficient documentation

## 2021-07-14 DIAGNOSIS — R102 Pelvic and perineal pain: Secondary | ICD-10-CM | POA: Diagnosis not present

## 2021-07-14 DIAGNOSIS — K644 Residual hemorrhoidal skin tags: Secondary | ICD-10-CM | POA: Insufficient documentation

## 2021-07-14 LAB — PROTIME-INR
INR: 1 (ref 0.8–1.2)
Prothrombin Time: 13 seconds (ref 11.4–15.2)

## 2021-07-14 LAB — COMPREHENSIVE METABOLIC PANEL
ALT: 18 U/L (ref 0–44)
AST: 19 U/L (ref 15–41)
Albumin: 3.6 g/dL (ref 3.5–5.0)
Alkaline Phosphatase: 102 U/L (ref 38–126)
Anion gap: 8 (ref 5–15)
BUN: 26 mg/dL — ABNORMAL HIGH (ref 8–23)
CO2: 28 mmol/L (ref 22–32)
Calcium: 9.1 mg/dL (ref 8.9–10.3)
Chloride: 102 mmol/L (ref 98–111)
Creatinine, Ser: 1.05 mg/dL (ref 0.61–1.24)
GFR, Estimated: >60 mL/min (ref >60)
Glucose, Bld: 98 mg/dL (ref 70–99)
Potassium: 4.2 mmol/L (ref 3.5–5.1)
Sodium: 138 mmol/L (ref 135–145)
Total Bilirubin: 0.8 mg/dL (ref 0.3–1.2)
Total Protein: 7.1 g/dL (ref 6.5–8.1)

## 2021-07-14 LAB — CBC WITH DIFFERENTIAL/PLATELET
Abs Immature Granulocytes: 0.04 10*3/uL (ref 0.00–0.07)
Basophils Absolute: 0 10*3/uL (ref 0.0–0.1)
Basophils Relative: 0 %
Eosinophils Absolute: 0.3 10*3/uL (ref 0.0–0.5)
Eosinophils Relative: 3 %
HCT: 44.7 % (ref 39.0–52.0)
Hemoglobin: 14.7 g/dL (ref 13.0–17.0)
Immature Granulocytes: 0 %
Lymphocytes Relative: 21 %
Lymphs Abs: 1.9 10*3/uL (ref 0.7–4.0)
MCH: 32.6 pg (ref 26.0–34.0)
MCHC: 32.9 g/dL (ref 30.0–36.0)
MCV: 99.1 fL (ref 80.0–100.0)
Monocytes Absolute: 0.9 10*3/uL (ref 0.1–1.0)
Monocytes Relative: 10 %
Neutro Abs: 5.8 10*3/uL (ref 1.7–7.7)
Neutrophils Relative %: 66 %
Platelets: 322 10*3/uL (ref 150–400)
RBC: 4.51 MIL/uL (ref 4.22–5.81)
RDW: 11.8 % (ref 11.5–15.5)
WBC: 8.9 10*3/uL (ref 4.0–10.5)
nRBC: 0 % (ref 0.0–0.2)

## 2021-07-14 LAB — TYPE AND SCREEN
ABO/RH(D): O POS
Antibody Screen: NEGATIVE

## 2021-07-14 LAB — LIPASE, BLOOD: Lipase: 39 U/L (ref 11–51)

## 2021-07-14 NOTE — ED Provider Triage Note (Signed)
Emergency Medicine Provider Triage Evaluation Note  Devin Landry , a 81 y.o. male  was evaluated in triage.  Pt complains of rectal bleeding for the past 1 hour. Has external hemorrhoids. No clots, but was in toilet bowl. No abdominal pain or dizziness. Denies AC. Reports that he has rectal pain. Reports last normal BM was Saturday, small Bms since.  Patient Active Problem List   Diagnosis Date Noted   Status post lumbar spine operation 07/02/2021   Colon polyp 09/19/2018   Other chronic pain 07/24/2017   Pain in joint of right shoulder 05/23/2017   Hx of adenomatous colonic polyps 06/20/2016   Asthma without status asthmaticus 04/23/2014   High cholesterol 04/23/2014   Benign prostatic hyperplasia with lower urinary tract symptoms 07/15/2013   Benign neoplasm of colon 07/15/2013   ED (erectile dysfunction) of organic origin 12/01/2011   History of elevated PSA 12/01/2011   Nephrolithiasis 12/01/2011   PIN III (prostatic intraepithelial neoplasm III) 12/01/2011   .  Review of Systems  Positive: Rectal pain, BRBPR Negative: Abd pain, dizziness  Physical Exam  There were no vitals taken for this visit. Gen:   Awake, no distress   Resp:  Normal effort  MSK:   Moves extremities without difficulty  Other:  Has catheter, 12 days post-op lumbar decompression  Medical Decision Making  Medically screening exam initiated at 11:48 AM.  Appropriate orders placed.  Devin Landry was informed that the remainder of the evaluation will be completed by another provider, this initial triage assessment does not replace that evaluation, and the importance of remaining in the ED until their evaluation is complete.    Marquette Old, PA-C 07/14/21 1155

## 2021-07-14 NOTE — ED Triage Notes (Signed)
Pt comes with c/o rectal bleeding that started about hour ago. Pt has hemorrhoid and family thinks it busted. Pt not on thinners. Pt states he has been taking oxy for pain following recent procedure.  Pt denie any belly pain.

## 2021-07-15 ENCOUNTER — Encounter: Payer: Self-pay | Admitting: Urology

## 2021-07-15 ENCOUNTER — Ambulatory Visit (INDEPENDENT_AMBULATORY_CARE_PROVIDER_SITE_OTHER): Payer: Medicare Other | Admitting: Neurosurgery

## 2021-07-15 ENCOUNTER — Ambulatory Visit: Payer: Medicare Other | Admitting: Physician Assistant

## 2021-07-15 ENCOUNTER — Ambulatory Visit: Payer: Medicare Other | Admitting: Urology

## 2021-07-15 ENCOUNTER — Ambulatory Visit (INDEPENDENT_AMBULATORY_CARE_PROVIDER_SITE_OTHER): Payer: Medicare Other | Admitting: Urology

## 2021-07-15 VITALS — BP 112/62 | Temp 97.5°F | Ht 72.0 in | Wt 205.0 lb

## 2021-07-15 DIAGNOSIS — R338 Other retention of urine: Secondary | ICD-10-CM | POA: Diagnosis not present

## 2021-07-15 DIAGNOSIS — N9989 Other postprocedural complications and disorders of genitourinary system: Secondary | ICD-10-CM | POA: Diagnosis not present

## 2021-07-15 DIAGNOSIS — R351 Nocturia: Secondary | ICD-10-CM | POA: Diagnosis not present

## 2021-07-15 DIAGNOSIS — Z09 Encounter for follow-up examination after completed treatment for conditions other than malignant neoplasm: Secondary | ICD-10-CM

## 2021-07-15 DIAGNOSIS — Z9889 Other specified postprocedural states: Secondary | ICD-10-CM

## 2021-07-15 DIAGNOSIS — M48062 Spinal stenosis, lumbar region with neurogenic claudication: Secondary | ICD-10-CM

## 2021-07-15 LAB — BLADDER SCAN AMB NON-IMAGING: Scan Result: 303

## 2021-07-15 MED ORDER — METHOCARBAMOL 500 MG PO TABS: 500.0000 mg | ORAL_TABLET | Freq: Three times a day (TID) | ORAL | 0 refills | Status: AC | PRN

## 2021-07-15 NOTE — Progress Notes (Signed)
Simple Catheter Placement  Due to urinary retention patient is present today for a foley cath placement.  Patient was cleaned and prepped in a sterile fashion with betadine. A 16 FR Coude foley catheter was inserted, urine return was noted  1438m, urine was yellow in color.  The balloon was filled with 10cc of sterile water.  A leg bag was attached for drainage. Patient was also given a night bag to take home and was given instruction on how to change from one bag to another.  Patient was given instruction on proper catheter care.  Patient tolerated well, no complications were noted   Performed by: ALesli Albee Additional notes/ Follow up: 1 week with Dr. SBernardo Heater

## 2021-07-15 NOTE — Addendum Note (Signed)
Addended by: Chrystie Nose on: 07/15/2021 01:43 PM   Modules accepted: Orders

## 2021-07-15 NOTE — Progress Notes (Signed)
Catheter Removal  Patient is present today for a catheter removal.  32m of water was drained from the balloon. A 16FR foley cath was removed from the bladder no complications were noted . Patient tolerated well.  Performed by: ALesli Albee Follow up/ Additional notes[facility]

## 2021-07-15 NOTE — Progress Notes (Signed)
07/15/2021 7:08 PM   Devin Landry 09-27-1940 [id number]  Referring provider: Baxter Hire, MD [place],  [place] [address]  Urological history: 1.  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 Elevated PSA -Prostate biopsy 2007 benign -Repeat biopsy March 2010 PSA 7.4; volume 54 g; high-grade PIN  3. BPH with LU TS -Finasteride 5 mg daily and tamsulosin 0.4 mg daily  4. ED  -he and his wife are not sexually active  Chief Complaint  Patient presents with   Urinary Retention    HPI: Devin Landry is a 81 y.o. male who presents this afternoon after having his catheter removed this am for a voiding trial with his daughter, [name].  Foley catheter was removed this morning without incident.  Patient went home and increased fluid intake and had been unable to void.  He presents to the office for his follow-up PVR.  Initial PVR in the room was 303 mL and patient continued to have fluid intake.  He was able to urinate on 150 mL in the office, but after an hour and a half of sitting his PVR increased to 600 cc  He and his daughter are now wondering the next steps in addressing his retention.  [name] also mentioned that she has noticed her father making more urine during the night and less output during the day.    PMH: Past Medical History:  Diagnosis Date   Allergic state    Arthritis    BPH (benign prostatic hyperplasia)    Bursitis    hips   Cancer ([place])    basal and squamous cell   Cataracts, bilateral    ED (erectile dysfunction)    Elevated PSA    Heart murmur    born with mumur-asymptomatic   Hemorrhoids    History of chicken pox    History of colon polyps    History of kidney stones    Hypercholesteremia    Hypogonadism in male    Nephrolithiasis    PIN III (prostatic intraepithelial neoplasia III)    Spinal stenosis of lumbar region    Umbilical hernia     Surgical History: Past Surgical  History:  Procedure Laterality Date   ANKLE FRACTURE SURGERY Left 10/10/2007   CARPAL TUNNEL RELEASE Right 2007   CATARACT EXTRACTION, BILATERAL Bilateral    COLONOSCOPY     1993, 1996, 2000, 2005,2010, 2015, 2018   COLONOSCOPY WITH PROPOFOL N/A 09/21/2016   Procedure: COLONOSCOPY WITH PROPOFOL;  Surgeon: Manya Silvas, MD;  Location: [facility] ENDOSCOPY;  Service: Endoscopy;  Laterality: N/A;   CYSTOSCOPY W/ RETROGRADES  04/13/2021   Procedure: CYSTOSCOPY WITH RETROGRADE PYELOGRAM;  Surgeon: Abbie Sons, MD;  Location: [facility] ORS;  Service: Urology;;   CYSTOSCOPY/URETEROSCOPY/HOLMIUM LASER/STENT PLACEMENT Right 04/13/2021   Procedure: CYSTOSCOPY/URETEROSCOPY/HOLMIUM LASER/STENT PLACEMENT;  Surgeon: Abbie Sons, MD;  Location: [facility] ORS;  Service: Urology;  Laterality: Right;   FEMUR FRACTURE SURGERY Left 10/10/2007   FRACTURE SURGERY Left 10/10/2007   ORIF DISTAL RADIUS FRACTURE AND PERTROCHANTERIC FEMUR FRACTURE   HERNIA REPAIR Bilateral 1964   KNEE SURGERY Left 1984   torn meniscus   KNEE SURGERY Right 2005   torn meniscus   LUMBAR LAMINECTOMY/DECOMPRESSION MICRODISCECTOMY N/A 07/02/2021   Procedure: L2-S1 DECOMPRESSION;  Surgeon: Meade Maw, MD;  Location: [facility] ORS;  Service: Neurosurgery;  Laterality: N/A;    Home Medications:  Allergies as of 07/15/2021       Reactions  Food Color Red Anaphylaxis   Red delicious apple skins   Other    Red Delicious Apple skins causes anaphylaxis    Soy Allergy Other (See Comments)   Headache Other reaction(s): Headache   Zocor [simvastatin] Other (See Comments)   Leg pain        Medication List        Accurate as of July 15, 2021  7:08 PM. If you have any questions, ask your nurse or doctor.          STOP taking these medications    oxyCODONE 5 MG immediate release tablet Commonly known as: Oxy IR/ROXICODONE Stopped by: Loleta Dicker, PA   QC Vitamin B12 5000 MCG Subl Generic drug:  Cyanocobalamin Stopped by: Debroah Loop, PA-C   vitamin C 500 MG tablet Commonly known as: ASCORBIC ACID Stopped by: Debroah Loop, PA-C       TAKE these medications    acetaminophen 650 MG CR tablet Commonly known as: TYLENOL Take 650 mg by mouth 2 (two) times daily.   B-12 PO Take 1 tablet by mouth 4 (four) times a week.   CALCIUM CITRATE + D PO Take 1 tablet by mouth 2 (two) times daily. Calcium citrate 500 mg Vitamin D3 400 IU   cholestyramine 4 GM/DOSE powder Commonly known as: QUESTRAN Take 4 g by mouth daily as needed (diarrhea).   CLEAR FIBER POWDER PO Take 1 Dose by mouth daily as needed.   diphenhydramine-acetaminophen 25-500 MG Tabs tablet Commonly known as: TYLENOL PM Take 1 tablet by mouth at bedtime as needed.   docusate sodium 100 MG capsule Commonly known as: COLACE Take 100 mg by mouth 2 (two) times daily.   EQL Vitamin D3 25 MCG (1000 UT) capsule Generic drug: Cholecalciferol Take 1,000 Units by mouth daily as needed.   finasteride 5 MG tablet Commonly known as: PROSCAR Take 1 tablet (5 mg total) by mouth daily. What changed: when to take this   gabapentin 300 MG capsule Commonly known as: NEURONTIN Take 1 capsule by mouth at bedtime.   gabapentin 100 MG capsule Commonly known as: NEURONTIN 200 mg daily.   GLUCOSAMINE 1500 COMPLEX PO Take 1,500 mg by mouth 2 (two) times daily.   methocarbamol 500 MG tablet Commonly known as: ROBAXIN Take 1 tablet (500 mg total) by mouth every 8 (eight) hours as needed for muscle spasms. What changed: when to take this Changed by: Loleta Dicker, PA   multivitamin tablet Take 1 tablet by mouth daily.   naproxen sodium 220 MG tablet Commonly known as: ALEVE Take 220 mg by mouth daily.   oxymetazoline 0.05 % nasal spray Commonly known as: AFRIN Place 1 spray into both nostrils 2 (two) times daily as needed for congestion.   rosuvastatin 5 MG tablet Commonly known as:  CRESTOR Take 5 mg by mouth every evening.   tamsulosin 0.4 MG Caps capsule Commonly known as: FLOMAX Take 1 capsule (0.4 mg total) by mouth daily. What changed: when to take this   Turmeric 500 MG Caps Take 1 capsule by mouth daily.        Allergies:  Allergies  Allergen Reactions   Food Color Red Anaphylaxis    Red delicious apple skins   Other     Red Delicious Apple skins causes anaphylaxis     Soy Allergy Other (See Comments)    Headache Other reaction(s): Headache   Zocor [Simvastatin] Other (See Comments)    Leg pain    Family  History: No family history on file.  Social History:  reports that he quit smoking about 43 years ago. His smoking use included pipe. He has never used smokeless tobacco. He reports that he does not drink alcohol and does not use drugs.  ROS: Pertinent ROS in HPI  Physical Exam: Constitutional:  Well nourished. Alert and oriented, No acute distress. HEENT: [place] AT, moist mucus membranes.  Trachea midline, no masses. Cardiovascular: No clubbing, cyanosis, or edema. Respiratory: Normal respiratory effort, no increased work of breathing. Neurologic: Grossly intact, no focal deficits, moving all 4 extremities. Psychiatric: Normal mood and affect.  Laboratory Data: Lab Results  Component Value Date   WBC 8.9 07/14/2021   HGB 14.7 07/14/2021   HCT 44.7 07/14/2021   MCV 99.1 07/14/2021   PLT 322 07/14/2021    Lab Results  Component Value Date   CREATININE 1.05 07/14/2021    Lab Results  Component Value Date   AST 19 07/14/2021   Lab Results  Component Value Date   ALT 18 07/14/2021    Urinalysis    Component Value Date/Time   COLORURINE STRAW (A) 06/16/2021 0839   APPEARANCEUR CLEAR (A) 06/16/2021 0839   APPEARANCEUR Clear 04/22/2021 0842   LABSPEC 1.009 06/16/2021 0839   PHURINE 6.0 06/16/2021 0839   GLUCOSEU NEGATIVE 06/16/2021 0839   HGBUR NEGATIVE 06/16/2021 0839   BILIRUBINUR NEGATIVE 06/16/2021 0839    BILIRUBINUR Negative 04/22/2021 0842   KETONESUR NEGATIVE 06/16/2021 0839   PROTEINUR NEGATIVE 06/16/2021 0839   NITRITE NEGATIVE 06/16/2021 0839   LEUKOCYTESUR NEGATIVE 06/16/2021 0839  I have reviewed the labs.   Pertinent Imaging:  Most Recent  Scan Result 303 07/15/21 13:43  I have independently reviewed the films.    Assessment & Plan:    1. Urinary retention -Explained at this time it is difficult to know if the retention is still the result of his back surgery, or constipation/pain medications or BPH -He did have prominent lateral lobe enlargement with moderate bladder neck elevation seen on cystoscopy in April 2023, so some of his urinary retention issues may be related to BPH -advised to continue the tamsulosin -They will schedule appointment with Dr. Bernardo Heater to discuss further options  2. Nocturia -Explained that the increase of nighttime urine production may be the result of sleep apnea and explained the mechanism of action -They may consider further investigation into the possibility of him having sleep apnea in the future  Return for appointment with Dr. Bernardo Heater to discuss treatment options for urinary retention .  These notes generated with voice recognition software. I apologize for typographical errors.  Zara Council, PA-C  [facility] [address]  [place], [place] [address] [phone]   I spent 30 minutes on the day of the encounter to include pre-visit record review, face-to-face time with the patient, and post-visit ordering of tests.

## 2021-07-15 NOTE — Progress Notes (Incomplete)
07/15/21 12:02 AM   Devin Landry Jul 23, 1940 [id number]  Referring provider:  Baxter Hire, MD [place],  [place] [address] No chief complaint on file.   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       PMH: Past Medical History:  Diagnosis Date   Allergic state    Arthritis    BPH (benign prostatic hyperplasia)    Bursitis    hips   Cancer ([place])    basal and squamous cell   Cataracts, bilateral    ED (erectile dysfunction)    Elevated PSA    Heart murmur    born with mumur-asymptomatic   Hemorrhoids    History of chicken pox    History of colon polyps    History of kidney stones    Hypercholesteremia    Hypogonadism in male    Nephrolithiasis    PIN III (prostatic intraepithelial neoplasia III)    Spinal stenosis of lumbar region    Umbilical hernia     Surgical History: Past Surgical History:  Procedure Laterality Date   ANKLE FRACTURE SURGERY Left 10/10/2007   CARPAL TUNNEL RELEASE Right 2007   CATARACT EXTRACTION, BILATERAL Bilateral    COLONOSCOPY     1993, 1996, 2000, 2005,2010, 2015, 2018   COLONOSCOPY WITH PROPOFOL N/A 09/21/2016   Procedure: COLONOSCOPY WITH PROPOFOL;  Surgeon: Manya Silvas, MD;  Location: [facility] ENDOSCOPY;  Service: Endoscopy;  Laterality: N/A;   CYSTOSCOPY W/ RETROGRADES  04/13/2021   Procedure: CYSTOSCOPY WITH RETROGRADE PYELOGRAM;  Surgeon: Abbie Sons, MD;  Location: [facility] ORS;  Service: Urology;;   CYSTOSCOPY/URETEROSCOPY/HOLMIUM LASER/STENT PLACEMENT Right 04/13/2021   Procedure: CYSTOSCOPY/URETEROSCOPY/HOLMIUM LASER/STENT PLACEMENT;  Surgeon: Abbie Sons, MD;  Location: [facility] ORS;  Service: Urology;  Laterality: Right;   FEMUR FRACTURE SURGERY Left  10/10/2007   FRACTURE SURGERY Left 10/10/2007   ORIF DISTAL RADIUS FRACTURE AND PERTROCHANTERIC FEMUR FRACTURE   HERNIA REPAIR Bilateral 1964   KNEE SURGERY Left 1984   torn meniscus   KNEE SURGERY Right 2005   torn meniscus   LUMBAR LAMINECTOMY/DECOMPRESSION MICRODISCECTOMY N/A 07/02/2021   Procedure: L2-S1 DECOMPRESSION;  Surgeon: Meade Maw, MD;  Location: [facility] ORS;  Service: Neurosurgery;  Laterality: N/A;    Home Medications:  Allergies as of 07/15/2021       Reactions   Other    Red Delicious Apple skins causes anaphylaxis    Zocor [simvastatin] Other (See Comments)   Leg pain        Medication List        Accurate as of July 15, 2021 12:02 AM. If you have any questions, ask your nurse or doctor.          acetaminophen 650 MG CR tablet Commonly known as: TYLENOL Take 650 mg by mouth 2 (two) times daily.   B-12 PO Take 1 tablet by mouth 4 (four) times a week.   CALCIUM CITRATE + D PO Take 1 tablet by mouth 2 (two) times daily. Calcium citrate 500 mg Vitamin D3 400 IU   cholestyramine 4 GM/DOSE powder Commonly known as: QUESTRAN Take 4 g by mouth daily as needed (diarrhea).   CLEAR FIBER POWDER PO Take 1 Dose by mouth daily as needed.   diphenhydramine-acetaminophen 25-500  MG Tabs tablet Commonly known as: TYLENOL PM Take 1 tablet by mouth at bedtime as needed.   EQL Vitamin D3 25 MCG (1000 UT) capsule Generic drug: Cholecalciferol Take 1,000 Units by mouth daily as needed.   finasteride 5 MG tablet Commonly known as: PROSCAR Take 1 tablet (5 mg total) by mouth daily. What changed: when to take this   gabapentin 300 MG capsule Commonly known as: NEURONTIN Take 1 capsule by mouth at bedtime.   gabapentin 100 MG capsule Commonly known as: NEURONTIN 200 mg daily.   GLUCOSAMINE 1500 COMPLEX PO Take 1,500 mg by mouth 2 (two) times daily.   methocarbamol 500 MG tablet Commonly known as: ROBAXIN Take 1 tablet (500 mg total) by mouth every  6 (six) hours as needed for muscle spasms.   multivitamin tablet Take 1 tablet by mouth daily.   naproxen sodium 220 MG tablet Commonly known as: ALEVE Take 220 mg by mouth daily.   oxyCODONE 5 MG immediate release tablet Commonly known as: Oxy IR/ROXICODONE Take 1 tablet (5 mg total) by mouth every 3 (three) hours as needed for moderate pain ((score 4 to 6)).   oxymetazoline 0.05 % nasal spray Commonly known as: AFRIN Place 1 spray into both nostrils 2 (two) times daily as needed for congestion.   QC Vitamin B12 5000 MCG Subl Generic drug: Cyanocobalamin Place 1 tablet under the tongue 4 (four) times a week.   rosuvastatin 5 MG tablet Commonly known as: CRESTOR Take 5 mg by mouth every evening.   tamsulosin 0.4 MG Caps capsule Commonly known as: FLOMAX Take 1 capsule (0.4 mg total) by mouth daily. What changed: when to take this   Turmeric 500 MG Caps Take 1 capsule by mouth daily.   vitamin C 500 MG tablet Commonly known as: ASCORBIC ACID Take 500 mg by mouth daily as needed.        Allergies:  Allergies  Allergen Reactions   Other     Red Delicious Apple skins causes anaphylaxis     Zocor [Simvastatin] Other (See Comments)    Leg pain    Family History: No family history on file.  Social History:  reports that he quit smoking about 43 years ago. His smoking use included pipe. He has never used smokeless tobacco. He reports that he does not drink alcohol and does not use drugs.   Physical Exam: There were no vitals taken for this visit.  Constitutional:  Alert and oriented, No acute distress. HEENT: [place] AT, moist mucus membranes.  Trachea midline, no masses. Cardiovascular: No clubbing, cyanosis, or edema. Respiratory: Normal respiratory effort, no increased work of breathing. GI: Abdomen is soft, nontender, nondistended, no abdominal masses GU: No CVA tenderness Lymph: No cervical or inguinal lymphadenopathy. Skin: No rashes, bruises or suspicious  lesions. Neurologic: Grossly intact, no focal deficits, moving all 4 extremities. Psychiatric: Normal mood and affect.  Laboratory Data:  Lab Results  Component Value Date   CREATININE 1.05 07/14/2021     No results found for: "HGBA1C"  Urinalysis   Pertinent Imaging:   Assessment & Plan:     No follow-ups on file.  [name] [address], [place], [place] [address] [phone]

## 2021-07-15 NOTE — Progress Notes (Signed)
   DOS: 07/02/21 L2-S1 decompression   HISTORY OF PRESENT ILLNESS: 07/15/2021 Mr. Devin Landry is about 2 weeks status post lumbar decompression.  He is doing well without any complaints in regards to his back.  He does have some bilateral hip pain which is chronic for him.  He did suffer from urinary retention postop and is currently doing a trial void.  Denies any incisional concerns.  PHYSICAL EXAMINATION:  There were no vitals filed for this visit. General: Patient is well developed, well nourished, calm, collected, and in no apparent distress.  NEUROLOGICAL:  General: In no acute distress.  Awake, alert, oriented to person, place, and time. Pupils equal round and reactive to light.   Strength:  Side Iliopsoas Quads Hamstring PF DF EHL  R '5 5 5 5 5 5  '$ L '5 5 5 5 5 5   '$ Incision c/d/i   ROS (Neurologic): Negative except as noted above  IMAGING: No interval imaging to review   ASSESSMENT/PLAN:  Devin Landry is doing well after lumbar decompression.  Overall he is doing well postoperatively in regards to his spine.  He will keep [place] updated on how he is doing in regards to his urinary retention.  We discussed activity escalation advised that he avoid lifting heavier than 10 pounds and avoid bending pulling and twisting activities.  He is given a refill of his Robaxin as requested.  We will see him back in 4 weeks to evaluate his progress.  He was encouraged to call the office in the interim with any questions or concerns.  He expressed understanding was in agreement this plan.   Cooper Render PA-C Department of Neurosurgery

## 2021-07-15 NOTE — Patient Instructions (Signed)

## 2021-07-15 NOTE — Progress Notes (Incomplete)
07/15/21 12:02 AM   Devin Landry 1940/05/13 [id number]  Referring provider:  Baxter Hire, MD [place],  [place] [address] No chief complaint on file.   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 Allergic state   . Arthritis   . BPH (benign prostatic hyperplasia)   . Bursitis    hips  . Cancer ([facility])    basal and squamous cell  . Cataracts, bilateral   . ED (erectile dysfunction)   . Elevated PSA   . Heart murmur    born with mumur-asymptomatic  . Hemorrhoids   . History of chicken pox   . History of colon polyps   . History of kidney stones   . Hypercholesteremia   . Hypogonadism in male   . Nephrolithiasis   . PIN III (prostatic intraepithelial neoplasia III)   . Spinal stenosis of lumbar region   . Umbilical hernia     Surgical History: Past Surgical History:  Procedure Laterality Date  . ANKLE FRACTURE SURGERY Left 10/10/2007  . CARPAL TUNNEL RELEASE Right 2007  . CATARACT EXTRACTION, BILATERAL Bilateral   . [place], [place], 2000, 2005,2010, 2015, 2018  . COLONOSCOPY WITH PROPOFOL N/A 09/21/2016   Procedure: COLONOSCOPY WITH PROPOFOL;  Surgeon: Manya Silvas, MD;  Location: [facility] ENDOSCOPY;  Service: Endoscopy;  Laterality: N/A;  . CYSTOSCOPY W/ RETROGRADES  04/13/2021   Procedure: CYSTOSCOPY WITH RETROGRADE PYELOGRAM;  Surgeon: Abbie Sons, MD;  Location: [facility] ORS;  Service: Urology;;  . CYSTOSCOPY/URETEROSCOPY/HOLMIUM LASER/STENT PLACEMENT Right 04/13/2021   Procedure: CYSTOSCOPY/URETEROSCOPY/HOLMIUM LASER/STENT PLACEMENT;  Surgeon: Abbie Sons, MD;  Location: [facility] ORS;  Service: Urology;  Laterality: Right;  . FEMUR FRACTURE SURGERY Left 10/10/2007   . FRACTURE SURGERY Left 10/10/2007   ORIF DISTAL RADIUS FRACTURE AND PERTROCHANTERIC FEMUR FRACTURE  . HERNIA REPAIR Bilateral 1964  . KNEE SURGERY Left 1984   torn meniscus  . KNEE SURGERY Right 2005   torn meniscus  . LUMBAR LAMINECTOMY/DECOMPRESSION MICRODISCECTOMY N/A 07/02/2021   Procedure: L2-S1 DECOMPRESSION;  Surgeon: Meade Maw, MD;  Location: [facility] ORS;  Service: Neurosurgery;  Laterality: N/A;    Home Medications:  Allergies as of 07/15/2021       Reactions   Other    Red Delicious Apple skins causes anaphylaxis    Zocor [simvastatin] Other (See Comments)   Leg pain        Medication List        Accurate as of July 15, 2021 12:02 AM. If you have any questions, ask your nurse or doctor.          acetaminophen 650 MG CR tablet Commonly known as: TYLENOL Take 650 mg by mouth 2 (two) times daily.   B-12 PO Take 1 tablet by mouth 4 (four) times a week.   CALCIUM CITRATE + D PO Take 1 tablet by mouth 2 (two) times daily. Calcium citrate 500 mg Vitamin D3 400 IU   cholestyramine 4 GM/DOSE powder Commonly known as: QUESTRAN Take 4 g by mouth daily as needed (diarrhea).   CLEAR FIBER POWDER PO Take 1 Dose by mouth daily as needed.   diphenhydramine-acetaminophen 25-500 MG Tabs tablet Commonly known as: TYLENOL PM  Take 1 tablet by mouth at bedtime as needed.   EQL Vitamin D3 25 MCG (1000 UT) capsule Generic drug: Cholecalciferol Take 1,000 Units by mouth daily as needed.   finasteride 5 MG tablet Commonly known as: PROSCAR Take 1 tablet (5 mg total) by mouth daily. What changed: when to take this   gabapentin 300 MG capsule Commonly known as: NEURONTIN Take 1 capsule by mouth at bedtime.   gabapentin 100 MG capsule Commonly known as: NEURONTIN 200 mg daily.   GLUCOSAMINE 1500 COMPLEX PO Take 1,500 mg by mouth 2 (two) times daily.   methocarbamol 500 MG tablet Commonly known as: ROBAXIN Take 1 tablet (500 mg total) by mouth every 6  (six) hours as needed for muscle spasms.   multivitamin tablet Take 1 tablet by mouth daily.   naproxen sodium 220 MG tablet Commonly known as: ALEVE Take 220 mg by mouth daily.   oxyCODONE 5 MG immediate release tablet Commonly known as: Oxy IR/ROXICODONE Take 1 tablet (5 mg total) by mouth every 3 (three) hours as needed for moderate pain ((score 4 to 6)).   oxymetazoline 0.05 % nasal spray Commonly known as: AFRIN Place 1 spray into both nostrils 2 (two) times daily as needed for congestion.   QC Vitamin B12 5000 MCG Subl Generic drug: Cyanocobalamin Place 1 tablet under the tongue 4 (four) times a week.   rosuvastatin 5 MG tablet Commonly known as: CRESTOR Take 5 mg by mouth every evening.   tamsulosin 0.4 MG Caps capsule Commonly known as: FLOMAX Take 1 capsule (0.4 mg total) by mouth daily. What changed: when to take this   Turmeric 500 MG Caps Take 1 capsule by mouth daily.   vitamin C 500 MG tablet Commonly known as: ASCORBIC ACID Take 500 mg by mouth daily as needed.        Allergies:  Allergies  Allergen Reactions  . Other     Red Delicious Apple skins causes anaphylaxis    . Zocor [Simvastatin] Other (See Comments)    Leg pain    Family History: No family history on file.  Social History:  reports that he quit smoking about 43 years ago. His smoking use included pipe. He has never used smokeless tobacco. He reports that he does not drink alcohol and does not use drugs.   Physical Exam: There were no vitals taken for this visit.  Constitutional:  Alert and oriented, No acute distress. HEENT: [place] AT, moist mucus membranes.  Trachea midline, no masses. Cardiovascular: No clubbing, cyanosis, or edema. Respiratory: Normal respiratory effort, no increased work of breathing. GI: Abdomen is soft, nontender, nondistended, no abdominal masses GU: No CVA tenderness Lymph: No cervical or inguinal lymphadenopathy. Skin: No rashes, bruises or suspicious  lesions. Neurologic: Grossly intact, no focal deficits, moving all 4 extremities. Psychiatric: Normal mood and affect.  Laboratory Data:  Lab Results  Component Value Date   CREATININE 1.05 07/14/2021     No results found for: "HGBA1C"  Urinalysis   Pertinent Imaging:   Assessment & Plan:     No follow-ups on file.  [facility] [address], [place], [place] [address] [phone]

## 2021-07-19 ENCOUNTER — Encounter: Payer: Self-pay | Admitting: Urology

## 2021-07-20 ENCOUNTER — Ambulatory Visit: Payer: Medicare Other | Admitting: Urology

## 2021-07-20 VITALS — BP 111/70 | HR 123 | Ht 72.0 in | Wt 205.0 lb

## 2021-07-20 DIAGNOSIS — N9989 Other postprocedural complications and disorders of genitourinary system: Secondary | ICD-10-CM | POA: Diagnosis not present

## 2021-07-20 DIAGNOSIS — N401 Enlarged prostate with lower urinary tract symptoms: Secondary | ICD-10-CM

## 2021-07-20 DIAGNOSIS — R338 Other retention of urine: Secondary | ICD-10-CM | POA: Diagnosis not present

## 2021-07-21 ENCOUNTER — Other Ambulatory Visit: Payer: Self-pay | Admitting: Urology

## 2021-07-21 ENCOUNTER — Telehealth: Payer: Self-pay

## 2021-07-21 DIAGNOSIS — N401 Enlarged prostate with lower urinary tract symptoms: Secondary | ICD-10-CM

## 2021-07-21 NOTE — Progress Notes (Signed)
[facility] Posting Form   Surgery Date/Time: Date: 07/27/2021  Surgeon: Dr. John Giovanni, MD  Surgery Location: Day Surgery  Inpt ( No  )   Outpt (Yes)   Obs ( No  )   Diagnosis: N40.1, R33.8 Benign Prostatic Hyperplasia with Urinary Retention  -CPT: 44360, [phone]  Surgery: Cystoscopy with insertion of Urolift   Stop Anticoagulations: No  Cardiac/Medical/Pulmonary Clearance needed: no  *Orders entered into EPIC  Date: 07/21/21   *Case booked in EPIC  Date: 07/21/21  *Notified pt of Surgery: Date: 07/21/21  PRE-OP UA & CX: no  *Placed into Prior Authorization Work Que Date: 07/21/21   Assistant/laser/rep:No

## 2021-07-21 NOTE — Progress Notes (Signed)
Surgical Physician Order Form [facility] [place]  * Scheduling expectation :  07/27/2021  *Length of Case: 30 min  *Clearance needed: no  *Anticoagulation Instructions: N/A  *Aspirin Instructions: N/A  *Post-op visit Date/Instructions:  1-3 day voiding trial  *Diagnosis:  BPH with urinary retention  *Procedure:   UroLift   Additional orders: N/A  -Admit type: OUTpatient  -Anesthesia: MAC  -VTE Prophylaxis Standing Order SCD's       Other:   -Standing Lab Orders Per Anesthesia    Lab other: None  -Standing Test orders EKG/Chest x-ray per Anesthesia       Test other:   - Medications:  Ancef 2gm IV  -Other orders:  N/A

## 2021-07-21 NOTE — Telephone Encounter (Signed)
I spoke with Mr. Bracknell. We have discussed possible surgery dates and Tuesday July 28th, 2023 was agreed upon by all parties. Patient given information about surgery date, what to expect pre-operatively and post operatively.  We discussed that a Pre-Admission Testing office will be calling to set up the pre-op visit that will take place prior to surgery, and that these appointments are typically done over the phone with a Pre-Admissions RN.  Informed patient that our office will communicate any additional care to be provided after surgery. Patients questions or concerns were discussed during our call. Advised to call our office should there be any additional information, questions or concerns that arise. Patient verbalized understanding.

## 2021-07-22 ENCOUNTER — Ambulatory Visit: Payer: Medicare Other | Admitting: Urology

## 2021-07-22 ENCOUNTER — Other Ambulatory Visit: Payer: Self-pay

## 2021-07-22 ENCOUNTER — Encounter: Payer: Self-pay | Admitting: Urology

## 2021-07-22 ENCOUNTER — Encounter
Admission: RE | Admit: 2021-07-22 | Discharge: 2021-07-22 | Disposition: A | Discharge status: Home / Self Care | Payer: Medicare Other | Source: Ambulatory Visit | Attending: Urology | Admitting: Urology

## 2021-07-22 VITALS — Ht 74.0 in | Wt 205.0 lb

## 2021-07-22 DIAGNOSIS — R338 Other retention of urine: Secondary | ICD-10-CM

## 2021-07-22 DIAGNOSIS — N401 Enlarged prostate with lower urinary tract symptoms: Secondary | ICD-10-CM

## 2021-07-22 DIAGNOSIS — N9989 Other postprocedural complications and disorders of genitourinary system: Secondary | ICD-10-CM

## 2021-07-22 LAB — BLADDER SCAN AMB NON-IMAGING: Scan Result: 111

## 2021-07-22 NOTE — Progress Notes (Signed)
07/22/2021 3:20 PM   Devin Landry [birth year] [id number]  Referring provider: Baxter Hire, MD [place],  [place] [address]  Chief Complaint  Patient presents with   Follow-up    HPI: 81 y.o. male presents for follow-up of urinary retention.  Refer to my prior note 07/20/2021 for details His daughter removed his catheter this morning and he has voided 450-500 mL since catheter removal.  His largest void was 350 mL. No pelvic discomfort or bladder fullness Bladder scan was 111 mL   PMH: Past Medical History:  Diagnosis Date   Allergic state    Arthritis    BPH (benign prostatic hyperplasia)    Bursitis    hips   Cancer ([facility])    basal and squamous cell   Cataracts, bilateral    ED (erectile dysfunction)    Elevated PSA    Heart murmur    born with mumur-asymptomatic   Hemorrhoids    History of chicken pox    History of colon polyps    History of kidney stones    Hypercholesteremia    Hypogonadism in male    Nephrolithiasis    PIN III (prostatic intraepithelial neoplasia III)    Spinal stenosis of lumbar region    Umbilical hernia     Surgical History: Past Surgical History:  Procedure Laterality Date   ANKLE FRACTURE SURGERY Left 10/10/2007   CARPAL TUNNEL RELEASE Right 2007   CATARACT EXTRACTION, BILATERAL Bilateral    COLONOSCOPY     1993, 1996, 2000, 2005,2010, 2015, 2018   COLONOSCOPY WITH PROPOFOL N/A 09/21/2016   Procedure: COLONOSCOPY WITH PROPOFOL;  Surgeon: Manya Silvas, MD;  Location: [facility] ENDOSCOPY;  Service: Endoscopy;  Laterality: N/A;   CYSTOSCOPY W/ RETROGRADES  04/13/2021   Procedure: CYSTOSCOPY WITH RETROGRADE PYELOGRAM;  Surgeon: Abbie Sons, MD;  Location: [facility] ORS;  Service: Urology;;   CYSTOSCOPY/URETEROSCOPY/HOLMIUM LASER/STENT PLACEMENT Right 04/13/2021   Procedure: CYSTOSCOPY/URETEROSCOPY/HOLMIUM LASER/STENT PLACEMENT;  Surgeon: Abbie Sons, MD;  Location: [facility] ORS;  Service: Urology;   Laterality: Right;   FEMUR FRACTURE SURGERY Left 10/10/2007   FRACTURE SURGERY Left 10/10/2007   ORIF DISTAL RADIUS FRACTURE AND PERTROCHANTERIC FEMUR FRACTURE   HERNIA REPAIR Bilateral 1964   KNEE SURGERY Left 1984   torn meniscus   KNEE SURGERY Right 2005   torn meniscus   LUMBAR LAMINECTOMY/DECOMPRESSION MICRODISCECTOMY N/A 07/02/2021   Procedure: L2-S1 DECOMPRESSION;  Surgeon: Meade Maw, MD;  Location: [facility] ORS;  Service: Neurosurgery;  Laterality: N/A;    Home Medications:  Allergies as of 07/22/2021       Reactions   Food Color Red Anaphylaxis   Red delicious apple skins   Other    Red Delicious Apple skins causes anaphylaxis    Soy Allergy Other (See Comments)   Headache Other reaction(s): Headache   Zocor [simvastatin] Other (See Comments)   Leg pain        Medication List        Accurate as of July 22, 2021  3:20 PM. If you have any questions, ask your nurse or doctor.          acetaminophen 650 MG CR tablet Commonly known as: TYLENOL Take 650 mg by mouth 2 (two) times daily.   B-12 PO Take 1 tablet by mouth 4 (four) times a week.   CALCIUM CITRATE + D PO Take 1 tablet by mouth 2 (two) times daily. Calcium citrate 500 mg Vitamin D3 400 IU   cholestyramine 4  GM/DOSE powder Commonly known as: QUESTRAN Take 4 g by mouth daily as needed (diarrhea).   CLEAR FIBER POWDER PO Take 1 Dose by mouth daily as needed.   diphenhydramine-acetaminophen 25-500 MG Tabs tablet Commonly known as: TYLENOL PM Take 1 tablet by mouth at bedtime as needed.   docusate sodium 100 MG capsule Commonly known as: COLACE Take 100 mg by mouth 2 (two) times daily.   EQL Vitamin D3 25 MCG (1000 UT) capsule Generic drug: Cholecalciferol Take 1,000 Units by mouth daily as needed.   finasteride 5 MG tablet Commonly known as: PROSCAR Take 1 tablet (5 mg total) by mouth daily. What changed: when to take this   gabapentin 300 MG capsule Commonly known as:  NEURONTIN Take 1 capsule by mouth at bedtime.   gabapentin 100 MG capsule Commonly known as: NEURONTIN 200 mg daily.   GLUCOSAMINE 1500 COMPLEX PO Take 1,500 mg by mouth 2 (two) times daily.   methocarbamol 500 MG tablet Commonly known as: ROBAXIN Take 1 tablet (500 mg total) by mouth every 8 (eight) hours as needed for muscle spasms.   multivitamin tablet Take 1 tablet by mouth daily.   naproxen sodium 220 MG tablet Commonly known as: ALEVE Take 220 mg by mouth daily.   oxymetazoline 0.05 % nasal spray Commonly known as: AFRIN Place 1 spray into both nostrils 2 (two) times daily as needed for congestion.   rosuvastatin 5 MG tablet Commonly known as: CRESTOR Take 5 mg by mouth every evening.   tamsulosin 0.4 MG Caps capsule Commonly known as: FLOMAX Take 1 capsule (0.4 mg total) by mouth daily. What changed: when to take this   Turmeric 500 MG Caps Take 1 capsule by mouth daily.        Allergies:  Allergies  Allergen Reactions   Food Color Red Anaphylaxis    Red delicious apple skins   Other     Red Delicious Apple skins causes anaphylaxis     Soy Allergy Other (See Comments)    Headache Other reaction(s): Headache   Zocor [Simvastatin] Other (See Comments)    Leg pain    Family History: No family history on file.  Social History:  reports that he quit smoking about 43 years ago. His smoking use included pipe. He has never used smokeless tobacco. He reports that he does not drink alcohol and does not use drugs.   Physical Exam: Ht '6\' 2"'$  (1.88 m)   Wt 205 lb (93 kg)   BMI 26.32 kg/m   Constitutional:  Alert and oriented, No acute distress. Psychiatric: Normal mood and affect.   Assessment & Plan:    1.  BPH with LUTS Successful voiding trial and catheter was not replaced He is scheduled for UroLift next week and desires to proceed All questions were answered regarding the procedure   Abbie Sons, MD  [facility] [address], [place], [place] [address] [phone]

## 2021-07-22 NOTE — Patient Instructions (Signed)
Your procedure is scheduled on: 07/27/21 Report to [facility]. To find out your arrival time please call [phone] between 1PM - 3PM on 07/26/21.  Remember: Instructions that are not followed completely may result in serious medical risk, up to and including death, or upon the discretion of your surgeon and anesthesiologist your surgery may need to be rescheduled.     _X__ 1. Do not eat food OR DRINK ANY after midnight the night before your procedure.                 No gum chewing or hard candies.   __X__2.  On the morning of surgery brush your teeth with toothpaste and water, you                 may rinse your mouth with mouthwash if you wish.  Do not swallow any              toothpaste of mouthwash.     _X__ 3.  No Alcohol for 24 hours before or after surgery.   _X__ 4.  Do Not Smoke or use e-cigarettes For 24 Hours Prior to Your Surgery.                 Do not use any chewable tobacco products for at least 6 hours prior to                 surgery.  ____  5.  Bring all medications with you on the day of surgery if instructed.   __X__  6.  Notify your doctor if there is any change in your medical condition      (cold, fever, infections).     Do not wear jewelry, make-up, hairpins, clips or nail polish. Do not wear lotions, powders, or perfumes.  Do not shave BODY HAIR 48 hours prior to surgery. Men may shave face and neck. Do not bring valuables to the hospital.    [facility] is not responsible for any belongings or valuables.  Contacts, dentures/partials or body piercings may not be worn into surgery. Bring a case for your contacts, glasses or hearing aids, a denture cup will be supplied. Leave your suitcase in the car. After surgery it may be brought to your room. For patients admitted to the hospital, discharge time is determined by your treatment team.   Patients discharged the day of surgery will not be allowed to  drive home.     __X__ Take these medicines the morning of surgery with A SIP OF WATER:    1. gabapentin (NEURONTIN) 200 MG capsule  2. tamsulosin (FLOMAX) 0.4 MG CAPS capsule  3.   4.  5.  6.  ____ Fleet Enema (as directed)   ____ Use CHG Soap/SAGE wipes as directed  ____ Use inhalers on the day of surgery  ____ Stop metformin/Janumet/Farxiga 2 days prior to surgery    ____ Take 1/2 of usual insulin dose the night before surgery. No insulin the morning          of surgery.   ____ Stop Blood Thinners Coumadin/Plavix/Xarelto/Pleta/Pradaxa/Eliquis/Effient/Aspirin  on   Or contact your Surgeon, Cardiologist or Medical Doctor regarding  ability to stop your blood thinners  __X__ Stop Anti-inflammatories 7 days before surgery such as Advil, Ibuprofen, Motrin,  BC or Goodies Powder, Naprosyn, Naproxen, Aleve, Aspirin     MAY CONTINUE TAKING TYLENOL IF NEEDED   __X__ Stop all herbals and  supplements, fish oil or vitamins  until after surgery.    ____ Bring C-Pap to the hospital.

## 2021-07-22 NOTE — Progress Notes (Signed)
07/20/2021 9:47 AM   [name] [birth year] [id number]  Referring provider: Baxter Hire, MD [place],  [place] [address]  Chief Complaint  Patient presents with   Other    HPI: 81 y.o. male presents for follow-up of urinary retention.  He presents today with his daughter.  Postop retention after L2-S1 lumbar decompression 07/02/2021 Discharged with an indwelling Foley Voiding trial 07/15/2021: Voided 150 mL with residual of 600 mL.  States he did not have sensation he needed to void Underlying BPH on tamsulosin/finasteride Cystoscopy 04/13/2021 with prominent lateral lobe enlargement and no significant median lobe Prostate volume calculated on CT 11/2020 was 79 cc   PMH: Past Medical History:  Diagnosis Date   Allergic state    Arthritis    BPH (benign prostatic hyperplasia)    Bursitis    hips   Cancer (HCC)    basal and squamous cell   Cataracts, bilateral    ED (erectile dysfunction)    Elevated PSA    Heart murmur    born with mumur-asymptomatic   Hemorrhoids    History of chicken pox    History of colon polyps    History of kidney stones    Hypercholesteremia    Hypogonadism in male    Nephrolithiasis    PIN III (prostatic intraepithelial neoplasia III)    Spinal stenosis of lumbar region    Umbilical hernia     Surgical History: Past Surgical History:  Procedure Laterality Date   ANKLE FRACTURE SURGERY Left 10/10/2007   CARPAL TUNNEL RELEASE Right 2007   CATARACT EXTRACTION, BILATERAL Bilateral    COLONOSCOPY     1993, 1996, 2000, 2005,2010, 2015, 2018   COLONOSCOPY WITH PROPOFOL N/A 09/21/2016   Procedure: COLONOSCOPY WITH PROPOFOL;  Surgeon: Manya Silvas, MD;  Location: [facility] ENDOSCOPY;  Service: Endoscopy;  Laterality: N/A;   CYSTOSCOPY W/ RETROGRADES  04/13/2021   Procedure: CYSTOSCOPY WITH RETROGRADE PYELOGRAM;  Surgeon: Abbie Sons, MD;  Location: [facility] ORS;  Service: Urology;;   CYSTOSCOPY/URETEROSCOPY/HOLMIUM  LASER/STENT PLACEMENT Right 04/13/2021   Procedure: CYSTOSCOPY/URETEROSCOPY/HOLMIUM LASER/STENT PLACEMENT;  Surgeon: Abbie Sons, MD;  Location: [facility] ORS;  Service: Urology;  Laterality: Right;   FEMUR FRACTURE SURGERY Left 10/10/2007   FRACTURE SURGERY Left 10/10/2007   ORIF DISTAL RADIUS FRACTURE AND PERTROCHANTERIC FEMUR FRACTURE   HERNIA REPAIR Bilateral 1964   KNEE SURGERY Left 1984   torn meniscus   KNEE SURGERY Right 2005   torn meniscus   LUMBAR LAMINECTOMY/DECOMPRESSION MICRODISCECTOMY N/A 07/02/2021   Procedure: L2-S1 DECOMPRESSION;  Surgeon: Meade Maw, MD;  Location: [facility] ORS;  Service: Neurosurgery;  Laterality: N/A;    Home Medications:  Allergies as of 07/20/2021       Reactions   Food Color Red Anaphylaxis   Red delicious apple skins   Other    Red Delicious Apple skins causes anaphylaxis    Soy Allergy Other (See Comments)   Headache Other reaction(s): Headache   Zocor [simvastatin] Other (See Comments)   Leg pain        Medication List        Accurate as of July 20, 2021 11:59 PM. If you have any questions, ask your nurse or doctor.          acetaminophen 650 MG CR tablet Commonly known as: TYLENOL Take 650 mg by mouth 2 (two) times daily.   B-12 PO Take 1 tablet by mouth 4 (four) times a week.   CALCIUM CITRATE +  D PO Take 1 tablet by mouth 2 (two) times daily. Calcium citrate 500 mg Vitamin D3 400 IU   cholestyramine 4 GM/DOSE powder Commonly known as: QUESTRAN Take 4 g by mouth daily as needed (diarrhea).   CLEAR FIBER POWDER PO Take 1 Dose by mouth daily as needed.   diphenhydramine-acetaminophen 25-500 MG Tabs tablet Commonly known as: TYLENOL PM Take 1 tablet by mouth at bedtime as needed.   docusate sodium 100 MG capsule Commonly known as: COLACE Take 100 mg by mouth 2 (two) times daily.   EQL Vitamin D3 25 MCG (1000 UT) capsule Generic drug: Cholecalciferol Take 1,000 Units by mouth daily as needed.    finasteride 5 MG tablet Commonly known as: PROSCAR Take 1 tablet (5 mg total) by mouth daily. What changed: when to take this   gabapentin 300 MG capsule Commonly known as: NEURONTIN Take 1 capsule by mouth at bedtime.   gabapentin 100 MG capsule Commonly known as: NEURONTIN 200 mg daily.   GLUCOSAMINE 1500 COMPLEX PO Take 1,500 mg by mouth 2 (two) times daily.   methocarbamol 500 MG tablet Commonly known as: ROBAXIN Take 1 tablet (500 mg total) by mouth every 8 (eight) hours as needed for muscle spasms.   multivitamin tablet Take 1 tablet by mouth daily.   naproxen sodium 220 MG tablet Commonly known as: ALEVE Take 220 mg by mouth daily.   oxymetazoline 0.05 % nasal spray Commonly known as: AFRIN Place 1 spray into both nostrils 2 (two) times daily as needed for congestion.   rosuvastatin 5 MG tablet Commonly known as: CRESTOR Take 5 mg by mouth every evening.   tamsulosin 0.4 MG Caps capsule Commonly known as: FLOMAX Take 1 capsule (0.4 mg total) by mouth daily. What changed: when to take this   Turmeric 500 MG Caps Take 1 capsule by mouth daily.        Allergies:  Allergies  Allergen Reactions   Food Color Red Anaphylaxis    Red delicious apple skins   Other     Red Delicious Apple skins causes anaphylaxis     Soy Allergy Other (See Comments)    Headache Other reaction(s): Headache   Zocor [Simvastatin] Other (See Comments)    Leg pain    Family History: No family history on file.  Social History:  reports that he quit smoking about 43 years ago. His smoking use included pipe. He has never used smokeless tobacco. He reports that he does not drink alcohol and does not use drugs.   Physical Exam: BP 111/70   Pulse (!) 123   Ht 6' (1.829 m)   Wt 205 lb (93 kg)   BMI 27.80 kg/m   Constitutional:  Alert and oriented, No acute distress. Psychiatric: Normal mood and affect.   Assessment & Plan:    1.  BPH with urinary retention They  wanted to discuss outlet procedures and based on prostate size he would be a candidate for either UroLift or HoLEP.  Both procedures were discussed and he is interested in scheduling UroLift. We discussed possibility of persistent retention secondary to decreased detrusor tone Recommend increase tamsulosin to 0.8 mg and he will return later this week for a repeat voiding trial.  His daughter will remove his catheter in the morning and he will return in the afternoon for bladder scan If he proceeds with UroLift we discussed the most common postoperative side effects of frequency, urgency, dysuria for 7-14 days The low incidence of sexual side  effects was discussed   Abbie Sons, MD  [facility] [address], [place], [place] [address] [phone]

## 2021-07-22 NOTE — H&P (View-Only) (Signed)
07/22/2021 3:20 PM   FREDDERICK SWANGER 22-Oct-1940 [id number]  Referring provider: Baxter Hire, MD [place],  [place] [address]  Chief Complaint  Patient presents with   Follow-up    HPI: 81 y.o. male presents for follow-up of urinary retention.  Refer to my prior note 07/20/2021 for details His daughter removed his catheter this morning and he has voided 450-500 mL since catheter removal.  His largest void was 350 mL. No pelvic discomfort or bladder fullness Bladder scan was 111 mL   PMH: Past Medical History:  Diagnosis Date   Allergic state    Arthritis    BPH (benign prostatic hyperplasia)    Bursitis    hips   Cancer (HCC)    basal and squamous cell   Cataracts, bilateral    ED (erectile dysfunction)    Elevated PSA    Heart murmur    born with mumur-asymptomatic   Hemorrhoids    History of chicken pox    History of colon polyps    History of kidney stones    Hypercholesteremia    Hypogonadism in male    Nephrolithiasis    PIN III (prostatic intraepithelial neoplasia III)    Spinal stenosis of lumbar region    Umbilical hernia     Surgical History: Past Surgical History:  Procedure Laterality Date   ANKLE FRACTURE SURGERY Left 10/10/2007   CARPAL TUNNEL RELEASE Right 2007   CATARACT EXTRACTION, BILATERAL Bilateral    COLONOSCOPY     1993, 1996, 2000, 2005,2010, 2015, 2018   COLONOSCOPY WITH PROPOFOL N/A 09/21/2016   Procedure: COLONOSCOPY WITH PROPOFOL;  Surgeon: Manya Silvas, MD;  Location: [facility] ENDOSCOPY;  Service: Endoscopy;  Laterality: N/A;   CYSTOSCOPY W/ RETROGRADES  04/13/2021   Procedure: CYSTOSCOPY WITH RETROGRADE PYELOGRAM;  Surgeon: Abbie Sons, MD;  Location: [facility] ORS;  Service: Urology;;   CYSTOSCOPY/URETEROSCOPY/HOLMIUM LASER/STENT PLACEMENT Right 04/13/2021   Procedure: CYSTOSCOPY/URETEROSCOPY/HOLMIUM LASER/STENT PLACEMENT;  Surgeon: Abbie Sons, MD;  Location: [facility] ORS;  Service: Urology;   Laterality: Right;   FEMUR FRACTURE SURGERY Left 10/10/2007   FRACTURE SURGERY Left 10/10/2007   ORIF DISTAL RADIUS FRACTURE AND PERTROCHANTERIC FEMUR FRACTURE   HERNIA REPAIR Bilateral 1964   KNEE SURGERY Left 1984   torn meniscus   KNEE SURGERY Right 2005   torn meniscus   LUMBAR LAMINECTOMY/DECOMPRESSION MICRODISCECTOMY N/A 07/02/2021   Procedure: L2-S1 DECOMPRESSION;  Surgeon: Meade Maw, MD;  Location: [facility] ORS;  Service: Neurosurgery;  Laterality: N/A;    Home Medications:  Allergies as of 07/22/2021       Reactions   Food Color Red Anaphylaxis   Red delicious apple skins   Other    Red Delicious Apple skins causes anaphylaxis    Soy Allergy Other (See Comments)   Headache Other reaction(s): Headache   Zocor [simvastatin] Other (See Comments)   Leg pain        Medication List        Accurate as of July 22, 2021  3:20 PM. If you have any questions, ask your nurse or doctor.          acetaminophen 650 MG CR tablet Commonly known as: TYLENOL Take 650 mg by mouth 2 (two) times daily.   B-12 PO Take 1 tablet by mouth 4 (four) times a week.   CALCIUM CITRATE + D PO Take 1 tablet by mouth 2 (two) times daily. Calcium citrate 500 mg Vitamin D3 400 IU   cholestyramine 4  GM/DOSE powder Commonly known as: QUESTRAN Take 4 g by mouth daily as needed (diarrhea).   CLEAR FIBER POWDER PO Take 1 Dose by mouth daily as needed.   diphenhydramine-acetaminophen 25-500 MG Tabs tablet Commonly known as: TYLENOL PM Take 1 tablet by mouth at bedtime as needed.   docusate sodium 100 MG capsule Commonly known as: COLACE Take 100 mg by mouth 2 (two) times daily.   EQL Vitamin D3 25 MCG (1000 UT) capsule Generic drug: Cholecalciferol Take 1,000 Units by mouth daily as needed.   finasteride 5 MG tablet Commonly known as: PROSCAR Take 1 tablet (5 mg total) by mouth daily. What changed: when to take this   gabapentin 300 MG capsule Commonly known as:  NEURONTIN Take 1 capsule by mouth at bedtime.   gabapentin 100 MG capsule Commonly known as: NEURONTIN 200 mg daily.   GLUCOSAMINE 1500 COMPLEX PO Take 1,500 mg by mouth 2 (two) times daily.   methocarbamol 500 MG tablet Commonly known as: ROBAXIN Take 1 tablet (500 mg total) by mouth every 8 (eight) hours as needed for muscle spasms.   multivitamin tablet Take 1 tablet by mouth daily.   naproxen sodium 220 MG tablet Commonly known as: ALEVE Take 220 mg by mouth daily.   oxymetazoline 0.05 % nasal spray Commonly known as: AFRIN Place 1 spray into both nostrils 2 (two) times daily as needed for congestion.   rosuvastatin 5 MG tablet Commonly known as: CRESTOR Take 5 mg by mouth every evening.   tamsulosin 0.4 MG Caps capsule Commonly known as: FLOMAX Take 1 capsule (0.4 mg total) by mouth daily. What changed: when to take this   Turmeric 500 MG Caps Take 1 capsule by mouth daily.        Allergies:  Allergies  Allergen Reactions   Food Color Red Anaphylaxis    Red delicious apple skins   Other     Red Delicious Apple skins causes anaphylaxis     Soy Allergy Other (See Comments)    Headache Other reaction(s): Headache   Zocor [Simvastatin] Other (See Comments)    Leg pain    Family History: No family history on file.  Social History:  reports that he quit smoking about 43 years ago. His smoking use included pipe. He has never used smokeless tobacco. He reports that he does not drink alcohol and does not use drugs.   Physical Exam: Ht '6\' 2"'$  (1.88 m)   Wt 205 lb (93 kg)   BMI 26.32 kg/m   Constitutional:  Alert and oriented, No acute distress. Psychiatric: Normal mood and affect.   Assessment & Plan:    1.  BPH with LUTS Successful voiding trial and catheter was not replaced He is scheduled for UroLift next week and desires to proceed All questions were answered regarding the procedure   Abbie Sons, MD  [facility] [address] [place], [place] [address] [phone]

## 2021-07-23 ENCOUNTER — Ambulatory Visit: Payer: Medicare Other | Admitting: Urology

## 2021-07-26 ENCOUNTER — Inpatient Hospital Stay: Admission: RE | Admit: 2021-07-26 | Payer: Medicare Other | Source: Ambulatory Visit

## 2021-07-27 ENCOUNTER — Ambulatory Visit
Admission: RE | Admit: 2021-07-27 | Discharge: 2021-07-27 | Disposition: A | Discharge status: Home / Self Care | Payer: Medicare Other | Attending: Urology | Admitting: Urology

## 2021-07-27 ENCOUNTER — Ambulatory Visit: Payer: Medicare Other | Admitting: Urgent Care

## 2021-07-27 ENCOUNTER — Encounter: Payer: Self-pay | Admitting: Urology

## 2021-07-27 ENCOUNTER — Other Ambulatory Visit: Payer: Self-pay

## 2021-07-27 ENCOUNTER — Encounter
Admission: RE | Disposition: A | Discharge status: Home / Self Care | Payer: Self-pay | Source: Home / Self Care | Attending: Urology

## 2021-07-27 DIAGNOSIS — R3912 Poor urinary stream: Secondary | ICD-10-CM

## 2021-07-27 DIAGNOSIS — M1990 Unspecified osteoarthritis, unspecified site: Secondary | ICD-10-CM | POA: Insufficient documentation

## 2021-07-27 DIAGNOSIS — Z85828 Personal history of other malignant neoplasm of skin: Secondary | ICD-10-CM | POA: Insufficient documentation

## 2021-07-27 DIAGNOSIS — Z87891 Personal history of nicotine dependence: Secondary | ICD-10-CM | POA: Insufficient documentation

## 2021-07-27 DIAGNOSIS — R351 Nocturia: Secondary | ICD-10-CM

## 2021-07-27 DIAGNOSIS — N401 Enlarged prostate with lower urinary tract symptoms: Secondary | ICD-10-CM

## 2021-07-27 DIAGNOSIS — R3911 Hesitancy of micturition: Secondary | ICD-10-CM

## 2021-07-27 DIAGNOSIS — R3914 Feeling of incomplete bladder emptying: Secondary | ICD-10-CM | POA: Diagnosis not present

## 2021-07-27 DIAGNOSIS — E7800 Pure hypercholesterolemia, unspecified: Secondary | ICD-10-CM | POA: Insufficient documentation

## 2021-07-27 DIAGNOSIS — R339 Retention of urine, unspecified: Secondary | ICD-10-CM

## 2021-07-27 HISTORY — PX: CYSTOSCOPY WITH INSERTION OF UROLIFT: SHX6678

## 2021-07-27 SURGERY — CYSTOSCOPY WITH INSERTION OF UROLIFT
Anesthesia: Monitor Anesthesia Care | Site: Prostate

## 2021-07-27 MED ORDER — CEFAZOLIN SODIUM-DEXTROSE 2-4 GM/100ML-% IV SOLN
2.0000 g | INTRAVENOUS | Status: AC
  Administered 2021-07-27: 2 g via INTRAVENOUS

## 2021-07-27 MED ORDER — FENTANYL CITRATE (PF) 100 MCG/2ML IJ SOLN: 25.0000 ug | INTRAMUSCULAR | Status: DC | PRN

## 2021-07-27 MED ORDER — FENTANYL CITRATE (PF) 100 MCG/2ML IJ SOLN
INTRAMUSCULAR | Status: AC
  Filled 2021-07-27: qty 2

## 2021-07-27 MED ORDER — MIDAZOLAM HCL 2 MG/2ML IJ SOLN
INTRAMUSCULAR | Status: DC | PRN
  Administered 2021-07-27: 2 mg via INTRAVENOUS

## 2021-07-27 MED ORDER — URIBEL 118 MG PO CAPS: 1.0000 | ORAL_CAPSULE | Freq: Three times a day (TID) | ORAL | 0 refills | Status: AC | PRN

## 2021-07-27 MED ORDER — MIDAZOLAM HCL 2 MG/2ML IJ SOLN
INTRAMUSCULAR | Status: AC
  Filled 2021-07-27: qty 2

## 2021-07-27 MED ORDER — ONDANSETRON HCL 4 MG/2ML IJ SOLN
INTRAMUSCULAR | Status: DC | PRN
  Administered 2021-07-27: 4 mg via INTRAVENOUS

## 2021-07-27 MED ORDER — STERILE WATER FOR IRRIGATION IR SOLN
Status: DC | PRN
  Administered 2021-07-27 (×2): 3000 mL

## 2021-07-27 MED ORDER — PROPOFOL 10 MG/ML IV BOLUS
INTRAVENOUS | Status: AC
  Filled 2021-07-27: qty 20

## 2021-07-27 MED ORDER — CEFAZOLIN SODIUM-DEXTROSE 2-4 GM/100ML-% IV SOLN
INTRAVENOUS | Status: AC
  Filled 2021-07-27: qty 100

## 2021-07-27 MED ORDER — LACTATED RINGERS IV SOLN: INTRAVENOUS | Status: DC

## 2021-07-27 MED ORDER — LIDOCAINE HCL 2 % EX GEL
CUTANEOUS | Status: DC | PRN
  Administered 2021-07-27: 1 via URETHRAL

## 2021-07-27 MED ORDER — ACETAMINOPHEN 10 MG/ML IV SOLN
INTRAVENOUS | Status: AC
  Filled 2021-07-27: qty 100

## 2021-07-27 MED ORDER — PROPOFOL 500 MG/50ML IV EMUL
INTRAVENOUS | Status: DC | PRN
  Administered 2021-07-27: 110 ug/kg/min via INTRAVENOUS

## 2021-07-27 MED ORDER — ACETAMINOPHEN 10 MG/ML IV SOLN
INTRAVENOUS | Status: DC | PRN
  Administered 2021-07-27: 1000 mg via INTRAVENOUS

## 2021-07-27 MED ORDER — LIDOCAINE HCL (PF) 2 % IJ SOLN
INTRAMUSCULAR | Status: AC
  Filled 2021-07-27: qty 5

## 2021-07-27 MED ORDER — FENTANYL CITRATE (PF) 100 MCG/2ML IJ SOLN
INTRAMUSCULAR | Status: DC | PRN
  Administered 2021-07-27 (×2): 50 ug via INTRAVENOUS

## 2021-07-27 MED ORDER — ONDANSETRON HCL 4 MG/2ML IJ SOLN
INTRAMUSCULAR | Status: AC
  Filled 2021-07-27: qty 2

## 2021-07-27 MED ORDER — LIDOCAINE HCL URETHRAL/MUCOSAL 2 % EX GEL
CUTANEOUS | Status: AC
  Filled 2021-07-27: qty 10

## 2021-07-27 MED ORDER — LIDOCAINE HCL (CARDIAC) PF 100 MG/5ML IV SOSY
PREFILLED_SYRINGE | INTRAVENOUS | Status: DC | PRN
  Administered 2021-07-27: 40 mg via INTRAVENOUS

## 2021-07-27 SURGICAL SUPPLY — 19 items
BAG DRAIN CYSTO-URO LG1000N (MISCELLANEOUS) ×2 IMPLANT
BRUSH SCRUB EZ  4% CHG (MISCELLANEOUS) ×2
BRUSH SCRUB EZ 4% CHG (MISCELLANEOUS) ×1 IMPLANT
CATH FOL 2WAY LX 20X30 (CATHETERS) ×1 IMPLANT
GAUZE 4X4 16PLY ~~LOC~~+RFID DBL (SPONGE) ×4 IMPLANT
GLOVE SURG UNDER POLY LF SZ7.5 (GLOVE) ×2 IMPLANT
GOWN STRL REUS W/ TWL LRG LVL3 (GOWN DISPOSABLE) ×1 IMPLANT
GOWN STRL REUS W/ TWL XL LVL3 (GOWN DISPOSABLE) ×1 IMPLANT
GOWN STRL REUS W/TWL LRG LVL3 (GOWN DISPOSABLE) ×2
GOWN STRL REUS W/TWL XL LVL3 (GOWN DISPOSABLE) ×2
KIT TURNOVER CYSTO (KITS) ×2 IMPLANT
PACK CYSTO AR (MISCELLANEOUS) ×2 IMPLANT
SET CYSTO W/LG BORE CLAMP LF (SET/KITS/TRAYS/PACK) ×2 IMPLANT
SURGILUBE 2OZ TUBE FLIPTOP (MISCELLANEOUS) IMPLANT
SYSTEM UROLIFT 2 CART W/ HNDL (Male Continence) ×2 IMPLANT
SYSTEM UROLIFT 2 CARTRIDGE (Male Continence) ×8 IMPLANT
Urolift Delivery Handle with Scope Seal ×1 IMPLANT
WATER STERILE IRR 3000ML UROMA (IV SOLUTION) ×3 IMPLANT
WATER STERILE IRR 500ML POUR (IV SOLUTION) ×2 IMPLANT

## 2021-07-27 NOTE — Anesthesia Postprocedure Evaluation (Signed)
Anesthesia Post Note  Patient: Devin Landry  Procedure(s) Performed: CYSTOSCOPY WITH INSERTION OF UROLIFT (Prostate)  Patient location during evaluation: PACU Anesthesia Type: MAC Level of consciousness: awake and alert Pain management: pain level controlled Vital Signs Assessment: post-procedure vital signs reviewed and stable Respiratory status: spontaneous breathing, nonlabored ventilation, respiratory function stable and patient connected to nasal cannula oxygen Cardiovascular status: blood pressure returned to baseline and stable Postop Assessment: no apparent nausea or vomiting Anesthetic complications: no   No notable events documented.   Last Vitals:  Vitals:   07/27/21 1013 07/27/21 1032  BP: (!) 155/91 (!) 163/97  Pulse: 73 69  Resp: 16 16  Temp: (!) 36.1 C   SpO2: 100% 97%    Last Pain:  Vitals:   07/27/21 1032  TempSrc:   PainSc: [place]

## 2021-07-27 NOTE — Discharge Instructions (Addendum)
Urolift Post-Operative Instructions     Patient Expectations   1. Mild blood in your urine for about 1 week.  2. Urinary buring, frequency, and urgency for 10-14 days.  3. Mild pelvic pain 1-2 weeks.     Return to Activity     1. Drink water post procedure.  2. Take meds as needed.  Tylenol and/or Motrin is most helpful.  You may also by Pyridium/Azo over-the-counter for urinary burning.  Continue taking any prostate medications until your first postoperative visit.  A prescription for Uribel was sent to your pharmacy if the over-the-counter medications do not help your symptoms.  3. No lifting or straining 48hrs.  4. Other activity when they feel up to it.  5.  Call our office [phone]) [phone] for any questions or concerns. AMBULATORY SURGERY  DISCHARGE INSTRUCTIONS   The drugs that you were given will stay in your system until tomorrow so for the next 24 hours you should not:  Drive an automobile Make any legal decisions Drink any alcoholic beverage   You may resume regular meals tomorrow.  Today it is better to start with liquids and gradually work up to solid foods.  You may eat anything you prefer, but it is better to start with liquids, then soup and crackers, and gradually work up to solid foods.   Please notify your doctor immediately if you have any unusual bleeding, trouble breathing, redness and pain at the surgery site, drainage, fever, or pain not relieved by medication.    Additional Instructions:        Please contact your physician with any problems or Same Day Surgery at [phone], Monday through Friday 6 am to 4 pm, or [place] [facility] number at [phone].

## 2021-07-27 NOTE — Transfer of Care (Signed)
Immediate Anesthesia Transfer of Care Note  Patient: SRINIVAS LIPPMAN  Procedure(s) Performed: CYSTOSCOPY WITH INSERTION OF UROLIFT (Prostate)  Patient Location: PACU  Anesthesia Type:General  Level of Consciousness: drowsy  Airway & Oxygen Therapy: Patient Spontanous Breathing and Patient connected to face mask oxygen  Post-op Assessment: Report given to RN and Post -op Vital signs reviewed and stable  Post vital signs: Reviewed and stable  Last Vitals:  Vitals Value Taken Time  BP 110/72 07/27/21 0933  Temp 35.9 0933  Pulse 81 07/27/21 0937  Resp 18 07/27/21 0937  SpO2 97 % 07/27/21 0937  Vitals shown include unvalidated device data.  Last Pain:  Vitals:   07/27/21 0713  TempSrc: Temporal  PainSc: 2          Complications: No notable events documented.

## 2021-07-27 NOTE — Interval H&P Note (Signed)
History and Physical Interval Note:  CV:RRR Lungs:clear  07/27/2021 8:26 AM  [name]  has presented today for surgery, with the diagnosis of Benign Prostatic Hyperplasia with Urinary Retention.  The various methods of treatment have been discussed with the patient and family. After consideration of risks, benefits and other options for treatment, the patient has consented to  Procedure(s): CYSTOSCOPY WITH INSERTION OF UROLIFT (N/A) as a surgical intervention.  The patient's history has been reviewed, patient examined, no change in status, stable for surgery.  I have reviewed the patient's chart and labs.  Questions were answered to the patient's satisfaction.     [place]

## 2021-07-27 NOTE — Anesthesia Preprocedure Evaluation (Addendum)
Anesthesia Evaluation  Patient identified by MRN, date of birth, ID band Patient awake    Reviewed: Allergy & Precautions, NPO status , Patient's Chart, lab work & pertinent test results  History of Anesthesia Complications Negative for: history of anesthetic complications  Airway Mallampati: III  TM Distance: >3 FB Neck ROM: Full    Dental  (+) Implants, Teeth Intact   Pulmonary neg pulmonary ROS, asthma , former smoker,    Pulmonary exam normal breath sounds clear to auscultation       Cardiovascular Exercise Tolerance: Good negative cardio ROS Normal cardiovascular exam Rhythm:Regular Rate:Normal  ECG 04/09/21: normal   Neuro/Psych HOH negative neurological ROS  negative psych ROS   GI/Hepatic negative GI ROS, Neg liver ROS,   Endo/Other  negative endocrine ROS  Renal/GU Renal disease (nephrolithiasis)   BPH    Musculoskeletal  (+) Arthritis ,   Abdominal   Peds  Hematology negative hematology ROS (+)   Anesthesia Other Findings Past Medical History: No date: Allergic state No date: Arthritis No date: BPH (benign prostatic hyperplasia) No date: Bursitis     Comment:  hips No date: Cancer ([place])     Comment:  basal and squamous cell No date: Cataracts, bilateral No date: ED (erectile dysfunction) No date: Elevated PSA No date: Heart murmur     Comment:  born with mumur-asymptomatic No date: Hemorrhoids No date: History of chicken pox No date: History of colon polyps No date: History of kidney stones No date: Hypercholesteremia No date: Hypogonadism in male No date: Nephrolithiasis No date: PIN III (prostatic intraepithelial neoplasia III) No date: Spinal stenosis of lumbar region No date: Umbilical hernia  Past Surgical History: 10/10/2007: ANKLE FRACTURE SURGERY; Left 2007: CARPAL TUNNEL RELEASE; Right No date: CATARACT EXTRACTION, BILATERAL; Bilateral No date: COLONOSCOPY     Comment:   1993, 1996, 2000, 2005,2010, 2015, 2018 09/21/2016: COLONOSCOPY WITH PROPOFOL; N/A     Comment:  Procedure: COLONOSCOPY WITH PROPOFOL;  Surgeon: Manya Silvas, MD;  Location: [facility] ENDOSCOPY;  Service:               Endoscopy;  Laterality: N/A; 04/13/2021: CYSTOSCOPY W/ RETROGRADES     Comment:  Procedure: CYSTOSCOPY WITH RETROGRADE PYELOGRAM;                Surgeon: Abbie Sons, MD;  Location: [facility] ORS;                Service: Urology;; 04/13/2021: CYSTOSCOPY/URETEROSCOPY/HOLMIUM LASER/STENT PLACEMENT;  Right     Comment:  Procedure: [id number]/[id number]/BMWUXLK LASER/STENT               PLACEMENT;  Surgeon: Abbie Sons, MD;  Location:               [facility] ORS;  Service: Urology;  Laterality: Right; 10/10/2007: FEMUR FRACTURE SURGERY; Left 10/10/2007: FRACTURE SURGERY; Left     Comment:  ORIF DISTAL RADIUS FRACTURE AND PERTROCHANTERIC FEMUR               FRACTURE 1964: HERNIA REPAIR; Bilateral 1984: KNEE SURGERY; Left     Comment:  torn meniscus 2005: KNEE SURGERY; Right     Comment:  torn meniscus 07/02/2021: LUMBAR LAMINECTOMY/DECOMPRESSION MICRODISCECTOMY; N/A     Comment:  Procedure: L2-S1 DECOMPRESSION;  Surgeon: Meade Maw, MD;  Location: [facility] ORS;  Service: Neurosurgery;  Laterality: N/A;  BMI    Body Mass Index: 28.07 kg/m      Reproductive/Obstetrics negative OB ROS                           Anesthesia Physical  Anesthesia Plan  ASA: 2  Anesthesia Plan: General   Post-op Pain Management:    Induction: Intravenous  PONV Risk Score and Plan: 2 and Ondansetron, Dexamethasone and Treatment may vary due to age or medical condition  Airway Management Planned: LMA  Additional Equipment:   Intra-op Plan:   Post-operative Plan: Extubation in OR  Informed Consent: I have reviewed the patients History and Physical, chart, labs and discussed the procedure including the risks,  benefits and alternatives for the proposed anesthesia with the patient or authorized representative who has indicated his/her understanding and acceptance.     Dental advisory given  Plan Discussed with: CRNA  Anesthesia Plan Comments: (Patient consented for risks of anesthesia including but not limited to:  - adverse reactions to medications - damage to eyes, teeth, lips or other oral mucosa - nerve damage due to positioning  - sore throat or hoarseness - damage to heart, brain, nerves, lungs, other parts of body or loss of life  Informed patient about role of CRNA in peri- and intra-operative care.  Patient voiced understanding.)       Anesthesia Quick Evaluation

## 2021-07-27 NOTE — Op Note (Signed)
Preoperative diagnosis: BPH with incomplete bladder emptying; urinary hesitancy; decreased urinary stream; nocturia  Postoperative diagnosis: Same  Procedure: Urolift with placement of 9 implants (7 delivered; 2 attempted)  Surgeon: Bernardo Heater  Anesthesia: MAC  Complications: None  Drains: Foley catheter  Estimated blood loss: < 5 mL  Indications: 81 y.o. male with urinary retention after lumbar spine surgery.  Prior history of BPH with moderate LUTS prior to his procedure.  Passed second voiding trial after increasing tamsulosin with a residual of 111 mL.  He has requested further management.  Management options including TURP with resection/ablation of the prostate as well as Urolift were discussed.  The patient has chosen to have a Urolift procedure.  He has been instructed to the procedure as well as risks and complications which include but are not limited to infection, bleeding, and inadequate treatment with the Urolift procedure alone, anesthetic complications, among others.  He understands these and desires to proceed.  Findings: Using the 17 French cystoscope, urethra and bladder were inspected.  There were no urethral lesions.  Touching lateral lobes.  No median lobe and normal bladder neck  Description of procedure: The patient was properly identified in the holding area.  He received preoperative IV antibiotics.  He was taken to the operating room where IV sedation was obtained by anesthesia.  He is placed in the dorsolithotomy position.  Genitalia and perineum were prepped and draped.  Proper timeout was performed.  A 70F cystoscope was inserted into the bladder. The cystoscopy bridge was replaced with a UroLift2 delivery device.  The 1st pair of implants were placed 1.5-2 cm from the bladder neck. The right proximal implant did not deploy and required a second device which was delivered successfully.  The 2nd pair of implants were placed just proximal to the verumontanum.  The keel  device was then placed and cystoscopy was performed with an open anterior channel noted proximally and distally.  An additional pair of implants was placed between the prior pairs. the prior pairs.  Additional tissue was noted anteriorly in the midportion of the left lateral lobe and a fourth implant was attempted which did not deploy.  Repeat implant was successfully delivered.  A final cystoscopy was conducted first to inspect the location and state of each implant and second, to confirm the presence of a continuous anterior channel was present through the prostatic urethra with irrigation flow turned off.  No injury to the bladder or urethra was detected.  7 implants were delivered in total.   A 20 French Foley catheter was placed without difficulty and 15 mL of sterile water was instilled into the balloon  He tolerated procedure well and after anesthetic reversal was transported to the PACU in stable condition.  Plan: His daughter is an Therapist, sports and will remove his Foley catheter in the a.m. 1 month follow-up with PVR schedule   Anjeanette Petzold. MD

## 2021-07-28 ENCOUNTER — Encounter: Payer: Self-pay | Admitting: Urology

## 2021-07-29 ENCOUNTER — Ambulatory Visit: Payer: Medicare Other | Admitting: Physician Assistant

## 2021-08-14 ENCOUNTER — Other Ambulatory Visit: Payer: Self-pay | Admitting: Urology

## 2021-08-14 DIAGNOSIS — N401 Enlarged prostate with lower urinary tract symptoms: Secondary | ICD-10-CM

## 2021-08-17 ENCOUNTER — Encounter: Payer: Self-pay | Admitting: Neurosurgery

## 2021-08-17 ENCOUNTER — Ambulatory Visit: Payer: Medicare Other | Admitting: Neurosurgery

## 2021-08-17 VITALS — BP 139/94 | HR 95 | Temp 98.4°F | Ht 72.0 in | Wt 214.8 lb

## 2021-08-17 DIAGNOSIS — R2689 Other abnormalities of gait and mobility: Secondary | ICD-10-CM

## 2021-08-17 NOTE — Progress Notes (Signed)
   DOS: 07/02/21 (L2-S1 decompression)  HISTORY OF PRESENT ILLNESS: 08/17/2021 Mr. Devin Landry is status post.  He is doing very well.  His numbness has started to improve.  He cannot feel his right leg very well.  He is having some issues with his gait.   PHYSICAL EXAMINATION:   Vitals:   08/17/21 1039  BP: (!) 139/94  Pulse: 95  Temp: 98.4 F (36.9 C)   General: Patient is well developed, well nourished, calm, collected, and in no apparent distress.  NEUROLOGICAL:  General: In no acute distress.  Awake, alert, oriented to person, place, and time. Pupils equal round and reactive to light.   Strength:  Side Iliopsoas Quads Hamstring PF DF EHL  R '5 5 5 5 5 5  '$ L '5 5 5 5 5 5   '$ Incision c/d/i   ROS (Neurologic): Negative except as noted above  IMAGING: No interval imaging to review   ASSESSMENT/PLAN:  Devin Landry is doing well after lumbar decompression.  His pain is much improved.  He is having some issue with imbalance.  I will send him to physical therapy for this.  He will also stop his morning dose of gabapentin to see if that helps.  I will see him back in 6 weeks.  He is on a 25 pound lifting limit.  We discussed a return to driving.  Now that he can feel his right foot again, this seems like he is probably safe to return to driving.  I spent a total of 10 minutes in face-to-face and non-face-to-face activities related to this patient's care today.   Meade Maw MD, [facility] Department of Neurosurgery

## 2021-09-01 ENCOUNTER — Encounter: Payer: Self-pay | Admitting: Urology

## 2021-09-01 ENCOUNTER — Ambulatory Visit: Payer: Medicare Other | Admitting: Urology

## 2021-09-01 VITALS — BP 154/78 | HR 92 | Ht 72.0 in | Wt 207.0 lb

## 2021-09-01 DIAGNOSIS — N401 Enlarged prostate with lower urinary tract symptoms: Secondary | ICD-10-CM

## 2021-09-01 LAB — BLADDER SCAN AMB NON-IMAGING: Scan Result: 34

## 2021-09-01 NOTE — Progress Notes (Signed)
09/01/2021 10:50 AM   Devin Landry 08-11-1940 [id number]  Referring provider: Baxter Hire, MD [place],  [place] [address]  Chief Complaint  Patient presents with   Benign Prostatic Hypertrophy    HPI: 81 y.o. male presents for postop follow-up  UroLift 07/27/2021 (7 implants placed) Developed postoperative urinary retention after lumbar spine surgery Failed initial voiding trial.  Tamsulosin was increased to 0.8 mg and successful second voiding trial with a PVR 111 cc Presently doing quite well.  Voiding with good stream.  Nocturia has decreased to 1-2 times per night.  Is tracking voided volumes.  Daytime volumes are 200-300 cc.  He typically voids 500 cc prior to bedtime and typically will get up at 3:30 AM and voids around 600 cc IPSS today: 4/35   PMH: Past Medical History:  Diagnosis Date   Allergic state    Arthritis    BPH (benign prostatic hyperplasia)    Bursitis    hips   Cancer (HCC)    basal and squamous cell   Cataracts, bilateral    ED (erectile dysfunction)    Elevated PSA    Heart murmur    born with mumur-asymptomatic   Hemorrhoids    History of chicken pox    History of colon polyps    History of kidney stones    Hypercholesteremia    Hypogonadism in male    Nephrolithiasis    PIN III (prostatic intraepithelial neoplasia III)    Spinal stenosis of lumbar region    Umbilical hernia     Surgical History: Past Surgical History:  Procedure Laterality Date   ANKLE FRACTURE SURGERY Left 10/10/2007   CARPAL TUNNEL RELEASE Right 2007   CATARACT EXTRACTION, BILATERAL Bilateral    COLONOSCOPY     1993, 1996, 2000, 2005,2010, 2015, 2018   COLONOSCOPY WITH PROPOFOL N/A 09/21/2016   Procedure: COLONOSCOPY WITH PROPOFOL;  Surgeon: Manya Silvas, MD;  Location: [facility] ENDOSCOPY;  Service: Endoscopy;  Laterality: N/A;   CYSTOSCOPY W/ RETROGRADES  04/13/2021   Procedure: CYSTOSCOPY WITH RETROGRADE PYELOGRAM;  Surgeon: Abbie Sons, MD;  Location: [facility] ORS;  Service: Urology;;   CYSTOSCOPY WITH INSERTION OF UROLIFT N/A 07/27/2021   Procedure: CYSTOSCOPY WITH INSERTION OF UROLIFT;  Surgeon: Abbie Sons, MD;  Location: [facility] ORS;  Service: Urology;  Laterality: N/A;   CYSTOSCOPY/URETEROSCOPY/HOLMIUM LASER/STENT PLACEMENT Right 04/13/2021   Procedure: CYSTOSCOPY/URETEROSCOPY/HOLMIUM LASER/STENT PLACEMENT;  Surgeon: Abbie Sons, MD;  Location: [facility] ORS;  Service: Urology;  Laterality: Right;   FEMUR FRACTURE SURGERY Left 10/10/2007   FRACTURE SURGERY Left 10/10/2007   ORIF DISTAL RADIUS FRACTURE AND PERTROCHANTERIC FEMUR FRACTURE   HERNIA REPAIR Bilateral 1964   KNEE SURGERY Left 1984   torn meniscus   KNEE SURGERY Right 2005   torn meniscus   LUMBAR LAMINECTOMY/DECOMPRESSION MICRODISCECTOMY N/A 07/02/2021   Procedure: L2-S1 DECOMPRESSION;  Surgeon: Meade Maw, MD;  Location: [facility] ORS;  Service: Neurosurgery;  Laterality: N/A;    Home Medications:  Allergies as of 09/01/2021       Reactions   Food Color Red Anaphylaxis   Red delicious apple skins   Other    Red Delicious Apple skins causes anaphylaxis    Soy Allergy Other (See Comments)   Headache Other reaction(s): Headache   Zocor [simvastatin] Other (See Comments)   Leg pain        Medication List        Accurate as of September 01, 2021 10:50 AM. If  you have any questions, ask your nurse or doctor.          acetaminophen 650 MG CR tablet Commonly known as: TYLENOL Take 650 mg by mouth 2 (two) times daily.   B-12 PO Take 1 tablet by mouth 4 (four) times a week.   CALCIUM CITRATE + D PO Take 1 tablet by mouth 2 (two) times daily. Calcium citrate 500 mg Vitamin D3 400 IU   cholestyramine 4 GM/DOSE powder Commonly known as: QUESTRAN Take 4 g by mouth daily as needed (diarrhea).   CLEAR FIBER POWDER PO Take 1 Dose by mouth daily as needed.   diphenhydramine-acetaminophen 25-500 MG Tabs tablet Commonly known as:  TYLENOL PM Take 1 tablet by mouth at bedtime as needed.   docusate sodium 100 MG capsule Commonly known as: COLACE Take 100 mg by mouth 2 (two) times daily.   EQL Vitamin D3 25 MCG (1000 UT) capsule Generic drug: Cholecalciferol Take 1,000 Units by mouth daily as needed.   finasteride 5 MG tablet Commonly known as: PROSCAR Take 1 tablet (5 mg total) by mouth daily. What changed: when to take this   gabapentin 300 MG capsule Commonly known as: NEURONTIN Take 1 capsule by mouth at bedtime.   gabapentin 100 MG capsule Commonly known as: NEURONTIN Take 200 mg by mouth daily. '200mg'$  in the morning   GLUCOSAMINE 1500 COMPLEX PO Take 1,500 mg by mouth 2 (two) times daily.   methocarbamol 500 MG tablet Commonly known as: ROBAXIN Take 1 tablet (500 mg total) by mouth every 8 (eight) hours as needed for muscle spasms.   multivitamin tablet Take 1 tablet by mouth daily.   naproxen sodium 220 MG tablet Commonly known as: ALEVE Take 220 mg by mouth daily.   oxymetazoline 0.05 % nasal spray Commonly known as: AFRIN Place 1 spray into both nostrils 2 (two) times daily as needed for congestion.   rosuvastatin 5 MG tablet Commonly known as: CRESTOR Take 5 mg by mouth every evening.   tamsulosin 0.4 MG Caps capsule Commonly known as: FLOMAX TAKE 1 CAPSULE BY MOUTH ONCE DAILY   Turmeric 500 MG Caps Take 1 capsule by mouth daily.   Uribel 118 MG Caps Take 1 capsule (118 mg total) by mouth 3 (three) times daily as needed (Urinary frequency, urgency, burning).        Allergies:  Allergies  Allergen Reactions   Food Color Red Anaphylaxis    Red delicious apple skins   Other     Red Delicious Apple skins causes anaphylaxis     Soy Allergy Other (See Comments)    Headache Other reaction(s): Headache   Zocor [Simvastatin] Other (See Comments)    Leg pain    Family History: No family history on file.  Social History:  reports that he quit smoking about 43 years ago.  His smoking use included pipe. He has never used smokeless tobacco. He reports that he does not drink alcohol and does not use drugs.   Physical Exam: BP (!) 154/78   Pulse 92   Ht 6' (1.829 m)   Wt 207 lb (93.9 kg)   BMI 28.07 kg/m   Constitutional:  Alert and oriented, No acute distress. HEENT: [place] AT Respiratory: Normal respiratory effort, no increased work of breathing. Psychiatric: Normal mood and affect.   Assessment & Plan:    1.  BPH with LUTS Doing well status post UroLift PVR today 34 mL Still taking tamsulosin 0.8 mg daily.  He will drop  back to 1 capsule daily for 2 weeks and if no worsening symptoms can discontinue completely Scheduled for annual visit April 2024 and will add PVR/IPSS at that visit   Abbie Sons, [place] [address] [place], [place] [address] [phone]

## 2021-09-15 ENCOUNTER — Encounter: Payer: Medicare Other | Admitting: Physical Therapy

## 2021-09-15 ENCOUNTER — Encounter: Payer: Self-pay | Admitting: Physical Therapy

## 2021-09-15 ENCOUNTER — Ambulatory Visit: Payer: Medicare Other | Attending: Neurosurgery | Admitting: Physical Therapy

## 2021-09-15 DIAGNOSIS — R209 Unspecified disturbances of skin sensation: Secondary | ICD-10-CM | POA: Diagnosis present

## 2021-09-15 DIAGNOSIS — R2681 Unsteadiness on feet: Secondary | ICD-10-CM | POA: Insufficient documentation

## 2021-09-15 DIAGNOSIS — M5459 Other low back pain: Secondary | ICD-10-CM | POA: Diagnosis present

## 2021-09-15 DIAGNOSIS — R2689 Other abnormalities of gait and mobility: Secondary | ICD-10-CM | POA: Insufficient documentation

## 2021-09-15 NOTE — Therapy (Signed)
OUTPATIENT PHYSICAL THERAPY EVALUATION   Patient Name: Devin Landry MRN: [id number] DOB:1940-04-23, 81 y.o., male Today's Date: 09/15/2021   PT End of Session - 09/15/21 2100     Visit Number 1    Number of Visits 24    Date for PT Re-Evaluation 12/08/21    Authorization Type [employer] MEDICARE reporting period from 09/15/2021    Progress Note Due on Visit 10    PT Start Time 1600    PT Stop Time 1645    PT Time Calculation (min) 45 min    Equipment Utilized During Treatment Gait belt    Activity Tolerance Patient tolerated treatment well    Behavior During Therapy WFL for tasks assessed/performed   verbose, requiring redirection to stay on topic at times            Past Medical History:  Diagnosis Date   Allergic state    Arthritis    BPH (benign prostatic hyperplasia)    Bursitis    hips   Cancer ([place])    basal and squamous cell   Cataracts, bilateral    ED (erectile dysfunction)    Elevated PSA    Heart murmur    born with mumur-asymptomatic   Hemorrhoids    History of chicken pox    History of colon polyps    History of kidney stones    Hypercholesteremia    Hypogonadism in male    Nephrolithiasis    PIN III (prostatic intraepithelial neoplasia III)    Spinal stenosis of lumbar region    Umbilical hernia    Past Surgical History:  Procedure Laterality Date   ANKLE FRACTURE SURGERY Left 10/10/2007   CARPAL TUNNEL RELEASE Right 2007   CATARACT EXTRACTION, BILATERAL Bilateral    COLONOSCOPY     1993, 1996, 2000, 2005,2010, 2015, 2018   COLONOSCOPY WITH PROPOFOL N/A 09/21/2016   Procedure: COLONOSCOPY WITH PROPOFOL;  Surgeon: Manya Silvas, MD;  Location: [facility];  Service: Endoscopy;  Laterality: N/A;   CYSTOSCOPY W/ RETROGRADES  04/13/2021   Procedure: CYSTOSCOPY WITH RETROGRADE PYELOGRAM;  Surgeon: Abbie Sons, MD;  Location: [facility] ORS;  Service: Urology;;   CYSTOSCOPY WITH INSERTION OF UROLIFT N/A 07/27/2021   Procedure: CYSTOSCOPY WITH  INSERTION OF UROLIFT;  Surgeon: Abbie Sons, MD;  Location: [facility] ORS;  Service: Urology;  Laterality: N/A;   CYSTOSCOPY/URETEROSCOPY/HOLMIUM LASER/STENT PLACEMENT Right 04/13/2021   Procedure: CYSTOSCOPY/URETEROSCOPY/HOLMIUM LASER/STENT PLACEMENT;  Surgeon: Abbie Sons, MD;  Location: [facility] ORS;  Service: Urology;  Laterality: Right;   FEMUR FRACTURE SURGERY Left 10/10/2007   FRACTURE SURGERY Left 10/10/2007   ORIF DISTAL RADIUS FRACTURE AND PERTROCHANTERIC FEMUR FRACTURE   HERNIA REPAIR Bilateral 1964   KNEE SURGERY Left 1984   torn meniscus   KNEE SURGERY Right 2005   torn meniscus   LUMBAR LAMINECTOMY/DECOMPRESSION MICRODISCECTOMY N/A 07/02/2021   Procedure: L2-S1 DECOMPRESSION;  Surgeon: Meade Maw, MD;  Location: [facility] ORS;  Service: Neurosurgery;  Laterality: N/A;   Patient Active Problem List   Diagnosis Date Noted   Status post lumbar spine operation 07/02/2021   Colon polyp 09/19/2018   Other chronic pain 07/24/2017   Pain in joint of right shoulder 05/23/2017   Hx of adenomatous colonic polyps 06/20/2016   Asthma without status asthmaticus 04/23/2014   High cholesterol 04/23/2014   Benign prostatic hyperplasia with lower urinary tract symptoms 07/15/2013   Benign neoplasm of colon 07/15/2013   ED (erectile dysfunction) of organic origin 12/01/2011   History of elevated  PSA 12/01/2011   Nephrolithiasis 12/01/2011   PIN III (prostatic intraepithelial neoplasm III) 12/01/2011    PCP: Baxter Hire, MD  REFERRING PROVIDER: Meade Maw, MD  REFERRING DIAG: Imbalance   Rationale for Evaluation and Treatment Rehabilitation  THERAPY DIAG:  Unsteadiness on feet - Plan: PT plan of care cert/re-cert  Other low back pain - Plan: PT plan of care cert/re-cert  Unspecified disturbances of skin sensation - Plan: PT plan of care cert/re-cert  ONSET DATE: Worsening at start of this year and s/p L2-S1 lumbar decompression including central laminectomy  and bilateral medial facetectomies including foraminotomies on 07/02/2021.  SUBJECTIVE:                                                                                                                                                                                           SUBJECTIVE STATEMENT: Pateint reports he had in home PT after back surgery in June this year which got him walking with a cane instead of a walker. The main surgery he had was decompression fof 5 vertebrae (chart review shows s/p L2-S1 lumbar decompression including central laminectomy and bilateral medial facetectomies including foraminotomies on 07/02/2021). He has been complaining of pain in the hips since 2005 which is when he last came to PT at this clinic. He states he never complained of back pain at that itme. He states they are unsure if that is when the back problems started but maybe that is when his problems started. He has had some falls since then. He went back to the same orthopedic in the Spring of this year due to getting more pain in his legs and less ambulatory. After epidural injections they did an MRI and the surgeon said he would usually do the surgery the next day but since he was walking they did the surgery the next week. He had more numbness in his legs after the surgery. The numbness has improved in the right leg and he is not sure if his left leg has improved to baseline (prior to worsening at start of year). In 2009 he had a fall 12 feet to concrete and he had surgery to rebuild the L hip and ankle and heel. The left hip pin limits his left hip hip ROM but it is not painful now. He thinks Dr. Marry Guan did this surgery. He thinks he started having trouble with his balance and instability after the big fall in 2009. Prior to his back surgery he did not use a cane. He has been very active all his life including breaking horses and playing basket ball in high school. He feels his balance  issues are related to indactivity  from March 2023 when he started using a cane because he didn't feel stable enough. He usually does water walking for about 1.5 miles in 4 foot water 3x a week. He was out of the pool for 6 months when they closed the facility to rebuild it. Then he was out due to lumbar surgery.  He has been back in the water walking as soon as Dr. Izora Ribas would let him. He got about 4-6 months before they closed the pool again (currently). Prior to back surgery he had numbness and tightness in the left lower leg and thigh and that would be for hours. It got worse when walking on concrete, but was better when walking in the water or doing exercises. In December or January he  noticed he as having increasing tightness in his right lower leg like the left leg and started to feel numbness in right foot in March. He had back shots that din't help more than 45 min. He got the MRI when he could not feel his right foot on the pedal while driving. MD thought it would take 6 months to a year for the nerves to recover after the surgery. He states both legs are better since surgery but the left leg has stopped improving and is about where it was a year ago. The numbness in the left leg has almost been like sitting in an oak chair compressing the back of the left leg and makes the back of his leg tight in the thigh and the calf. He takes an anti-spasmodic at night that helps and gabapentin. He states he has a few kidney stones. He had kidney stone removal in June 2023. He had a urolift done in august 2023. He has not had as much further problems with balance after the kidney and urolift surgeries but it was hard to get his bowel and bladder function back.   PERTINENT HISTORY:  Patient is a 81 y.o. male who presents to outpatient physical therapy with a referral for medical diagnosis imbalance. This patient's chief complaints consist of paresthesia in L > R LE that affects his balance and ambulation leading to the following functional  deficits: difficulty with usual activities including sleeping, lifting, tying shoes, getting dressed, keeping balance while upright, prolonged walking on hard surface, walking without a cane, hobbies, climbing ladders (instructed not to), stairs, playing basketball, running, stopping, athletic endeavors. Relevant past medical history and comorbidities include arthritis, BPH, bursitis of hips, basal and squamous cell cancer, bilateral cataracts, heart murmur (asymptomatic), hemorrhoids, history of kidney stones, history of colon polyps, hypercholesteremia, Prostatic intraepithelial neoplasia III, spinal stenosis of lumbar region, umbilical hernia, L ankle fracture surgery (2009), right carpal tunnel release (2007), cataract extraction bilateral, cytoscopy with insertion of urolift (07/2021), ORIF DISTAL RADIUS FRACTURE AND PERTROCHANTERIC FEMUR FRACTURE (2009), hernia repair (1964), L knee surgery (torn meniscus 1984), R knee surgery (2005, torn meniscus), L2-S1 lumbar decompression including central laminectomy and bilateral medial facetectomies including foraminotomies on 07/02/2021, right shoulder pain.    PAIN:  Are you having pain? Yes: NPRS scale: Current: 2-3/10,  Best: 2/10, Worst: 5/10. Pain location: left lower leg Pain description: tension in his left lower leg Aggravating factors: sitting in a wooden chair, unclear Relieving factors: gabapentin, anti-spasmatic  FUNCTIONAL LIMITATIONS: difficulty with usual activities including sleeping, lifting, tying shoes, getting dressed, keeping balance while upright, prolonged walking on hard surface, walking without a cane, hobbies, climbing ladders (instructed not to), stairs, playing basketball, running,  stopping, athletic endeavors.   PRECAUTIONS: don't lift more than 20#, do not twist and lift.   WEIGHT BEARING RESTRICTIONS No  FALLS:  Has patient fallen in last 6 months? No but some close calls.  LIVING ENVIRONMENT: Lives with: lives with  daughter who is a Marine scientist.  Lives in: House/apartment, single floor.  Stairs: 2-3 steps to back door, 4 steps in garage with handrails.  Has following equipment at home: Single point cane, walker, hiking poles.   OCCUPATION: Was an Chief Financial Officer  PLOF: Independent  Leisure: hunting, fishing, woodworking, ball games, kids and great grand kids, swimming, yard work, Estate agent, fixing things, church.   PATIENT GOALS: "I want to be able to lift back into the 50# range so he can help bring in the groceries" develop flexible so he can put on socks and shoes. Would like to be able to walk 2 miles without a cane or assistance without it feeling like a great challenge.   OBJECTIVE  DIAGNOSTIC FINDINGS:  Lumbar MRI report from 03/04/2021:  CLINICAL DATA:  History of fall in 2009, left low back pain radiating to left leg and foot, numbness running down left leg for 2 years   EXAM: MRI LUMBAR SPINE WITHOUT CONTRAST   TECHNIQUE: Multiplanar, multisequence MR imaging of the lumbar spine was performed. No intravenous contrast was administered.   COMPARISON:  CT abdomen/pelvis 12/08/2020   FINDINGS: Segmentation: Standard; the lowest formed disc space is designated L5-S1.   Alignment: There is straightening of the normal lumbar lordosis. There is no antero or retrolisthesis.   Vertebrae: There is compression deformity of the L2 vertebral body with up to approximately 25% loss of vertebral body height centrally, similar to the prior CT abdomen/pelvis. There is no marrow edema. Vertebral body heights are otherwise preserved. There is degenerative endplate marrow signal abnormality with mild degenerative edema at L3-L4 and L4-L5. There is no other marrow edema. There is no suspicious marrow signal abnormality.   Conus medullaris and cauda equina: Conus extends to the T12-L1 level. There is buckling of the cauda equina nerve roots due to the below described spinal canal stenosis.    Paraspinal and other soft tissues: There is colonic diverticulosis. The paraspinal soft tissues are unremarkable.   Disc levels:   There is marked disc desiccation and narrowing at L3-L4. There is disc desiccation without significant loss of height at the other levels.   There is multilevel facet arthropathy, most advanced on the left at L5-S1.   T12-L1: No significant spinal canal or neural foraminal stenosis.   L1-L2: There is a mild disc bulge and mild bilateral facet arthropathy without significant spinal canal or neural foraminal stenosis.   L2-L3: There is a diffuse disc bulge, degenerative endplate change, and bilateral facet arthropathy resulting in severe spinal canal stenosis with compression of the cauda equina nerve roots without significant neural foraminal stenosis.   L3-L4: There is a diffuse disc bulge, ligamentum flavum thickening, and bilateral facet arthropathy resulting in severe spinal canal stenosis with compression of the cauda equina nerve roots and moderate right and mild left neural foraminal stenosis.   L4-L5: There is a diffuse disc bulge, ligamentum flavum thickening, and bilateral facet arthropathy resulting in severe spinal canal stenosis with compression of the cauda equina nerve roots and moderate to severe right and mild-to-moderate left neural foraminal stenosis   L5-S1: There is a mild disc bulge and bilateral facet arthropathy resulting in mild spinal canal stenosis with narrowing of the left subarticular zone and  possible irritation of the traversing S1 nerve root and mild left and no significant right neural foraminal stenosis.   IMPRESSION: 1. Degenerative changes in the lumbar spine as detailed above resulting in severe spinal canal stenosis at L2-L3 through L4-L5, worse at L3-L4 and L4-L5. 2. Moderate right and mild left neural foraminal stenosis at L3-L4, and moderate to severe right and mild-to-moderate left neural foraminal  stenosis at L4-L5. 3. Multilevel facet arthropathy, most advanced on the left at L5-S1. This contributes to narrowing of the left subarticular zone at L5-S1 with possible irritation of the traversing S1 nerve root. 4. Unchanged mild compression deformity of the L2 vertebral body without bony retropulsion. No evidence of acute injury in the lumbar spine.     Electronically Signed   By: Valetta Mole M.D.   On: 03/08/2021 12:34  SELF- REPORTED FUNCTION FOTO score: 56/100 (balance questionnaire)  OBSERVATION/INSPECTION Posture Posture (standing): flexed forwards at low back and waist, bilateral genuvarus, wide stance.  Anthropometrics Tremor: none Body composition: BMI: 27.9 Muscle bulk: decreased at bilateral lower legs Skin: shiny and slightly discolored at bilateral lower legs Functional Mobility Bed mobility: not tested Transfers: sit <> stand I to mod I, unsteady at times.  Gait: ambulates with SPC in R LE with stooped posture and wide stance with mild to moderate antalgic gait favoring L LE.   SPINE MOTION  LUMBAR SPINE AROM *Indicates pain Flexion: fingers mid shin, imbalance Extension: lacks ability to quite get to neutral from flexed posture.  Side Flexion:   R WFL  L WFL Rotation:  R WFL L WFL   NEUROLOGICAL Dermatomes L2-S2 appears equal and intact to light touch except the following: L4-S2 diminished on left compared to right (most pronounced at L5 and S1).   PERIPHERAL JOINT MOTION (in degrees)  ACTIVE RANGE OF MOTION (AROM) Comments:  09/15/2021: restrictions at B hip extension, left hip rotation noted during functional activities.   PASSIVE RANGE OF MOTION (PROM) Comments:  09/15/2021: grossly lacks B Hip extension and left knee extension. B knee flexion grossly WFL. Restrictions in L hip rotation noted grossly.   MUSCLE PERFORMANCE (MMT):  *Indicates pain 9/6/203 Date Date  Joint/Motion R/L R/L R/L  Hip     Flexion (L1, L2) 4/4+ / /  Knee      Extension (L3) 5/5 / /  Flexion (S2) 5/5 / /  Ankle/Foot     Dorsiflexion (L4) 4/4+ / /  Great toe extension (L5) 4-/4- / /  Eversion (S1) 5/5 / /  Plantarflexion (S1) 4-/4 / /  Inversion / / /  Pronation / / /  Great toe flexion / / /  Comments:  09/15/2021: Unable to heel walk or toe walk with B UE support  FUNCTIONAL/BALANCE TESTS: Five Time Sit to Stand (5TSTS): 13 seconds from 18.5 inch plinth with light left UE support on knee and R back of knee contacting plinth.   Ten meter walking trial (10MWT): self selected pace with SPC in R UE: 0.46 meters/second; fast pace with SPC in R UE: 1.24 meters/second 4.85 fast pace.   Romberg test: -Narrow stance, eyes open: >30 seconds -Narrow stance, eyes closed: 17 seconds  Sharpened Romberg test: -Tandem stance, eyes open: R = 9 seconds; L = 3 seconds.   TODAY'S TREATMENT  education   PATIENT EDUCATION:  Education details:  Education on diagnosis, prognosis, POC, anatomy and physiology of current condition Person educated: Patient Education method: Customer service manager Education comprehension: verbalized understanding and needs further education  HOME EXERCISE PROGRAM: TBD  ASSESSMENT:  CLINICAL IMPRESSION: Patient is a 81 y.o. male referred to outpatient physical therapy with a medical diagnosis of imbalance who presents with signs and symptoms consistent with unsteadiness on feet and difficulty walking s/p L2-S1 lumbar decompression including central laminectomy and bilateral medial facetectomies including foraminotomies on 07/02/2021. Patient has loss of sensation on L LE contributing to unsteadiness as well as altered back, hip, and knee posture and range of motion. Patient presents with significant pain, paresthesia, sensation, ROM, posture, joint stiffness, balance, gait, muscle performance (strength/power/endurance), activity tolerance, motor control, and proprioception impairments that are limiting ability to  complete his usual activities including sleeping, lifting, tying shoes, getting dressed, keeping balance while upright, prolonged walking on hard surface, walking without a cane, hobbies, climbing ladders (instructed not to), stairs, playing basketball, running, stopping, athletic endeavors without difficulty. Patient will benefit from skilled physical therapy intervention to address current body structure impairments and activity limitations to improve function and work towards goals set in current POC in order to return to prior level of function or maximal functional improvement.   OBJECTIVE IMPAIRMENTS Abnormal gait, decreased activity tolerance, decreased balance, decreased endurance, decreased knowledge of condition, decreased knowledge of use of DME, decreased mobility, difficulty walking, decreased ROM, decreased strength, hypomobility, increased fascial restrictions, impaired perceived functional ability, increased muscle spasms, impaired flexibility, impaired sensation, improper body mechanics, postural dysfunction, and pain.   ACTIVITY LIMITATIONS carrying, lifting, bending, standing, squatting, sleeping, stairs, transfers, bed mobility, dressing, and locomotion level  PARTICIPATION LIMITATIONS: interpersonal relationship, driving, community activity, yard work, and   usual activities including sleeping, lifting, tying shoes, getting dressed, keeping balance while upright, prolonged walking on hard surface, walking without a cane, hobbies, climbing ladders (instructed not to), stairs, playing basketball, running, stopping, athletic endeavors  PERSONAL FACTORS Age, Behavior pattern, Past/current experiences, Profession, Time since onset of injury/illness/exacerbation, and 3+ comorbidities:   arthritis, BPH, bursitis of hips, basal and squamous cell cancer, bilateral cataracts, heart murmur (asymptomatic), hemorrhoids, history of kidney stones, history of colon polyps, hypercholesteremia, Prostatic  intraepithelial neoplasia III, spinal stenosis of lumbar region, umbilical hernia, L ankle fracture surgery (2009), right carpal tunnel release (2007), cataract extraction bilateral, cytoscopy with insertion of urolift (07/2021), ORIF DISTAL RADIUS FRACTURE AND PERTROCHANTERIC FEMUR FRACTURE (2009), hernia repair (1964), L knee surgery (torn meniscus 1984), R knee surgery (2005, torn meniscus), L2-S1 lumbar decompression including central laminectomy and bilateral medial facetectomies including foraminotomies on 07/02/2021, right shoulder pain are also affecting patient's functional outcome.   REHAB POTENTIAL: Good  CLINICAL DECISION MAKING: Stable/uncomplicated  EVALUATION COMPLEXITY: Low   GOALS: Goals reviewed with patient? No  SHORT TERM GOALS: Target date: 09/29/2021  Patient will be independent with initial home exercise program for self-management of symptoms. Baseline: Initial HEP to be provided at visit 2 as appropriate (09/15/21); Goal status: INITIAL   LONG TERM GOALS: Target date: 12/08/2021  Patient will be independent with a long-term home exercise program for self-management of symptoms.  Baseline: Initial HEP to be provided at visit 2 as appropriate (09/15/21); Goal status: INITIAL  2.  Patient will demonstrate improved FOTO to equal or greater than 60 by visit #13 to demonstrate improvement in overall condition and self-reported functional ability.  Baseline: 56 (09/15/21); Goal status: INITIAL  3.  Patient will demonstrate the ability to stand in tandem stance for equal or greater than 30 seconds with each foot forwards to demonstrate decreased fall risk.  Baseline: R = 9 seconds; L = 3 seconds (09/15/21);  Goal status: INITIAL  4.  Patient will score equal or greater than 25/30 on Functional Gait Assessment to demonstrate low fall risk (09/15/2021);  Baseline: to be tested visit 2 as appropriate (09/15/21); Goal status: INITIAL  5.  Patient will complete community,  work and/or recreational activities without limitation due to current condition.  Baseline: difficulty with usual activities including sleeping, lifting, tying shoes, getting dressed, keeping balance while upright, prolonged walking on hard surface, walking without a cane, hobbies, climbing ladders (instructed not to), stairs, playing basketball, running, stopping, athletic endeavors (09/15/21); Goal status: INITIAL  PLAN: PT FREQUENCY: 1-2x/week  PT DURATION: 12 weeks  PLANNED INTERVENTIONS: Therapeutic exercises, Therapeutic activity, Neuromuscular re-education, Balance training, Gait training, Patient/Family education, Self Care, Joint mobilization, Stair training, DME instructions, Electrical stimulation, Spinal mobilization, Cryotherapy, Moist heat, Manual therapy, and Re-evaluation.  PLAN FOR NEXT SESSION: update HEP as appropriate, balance interventions, LE and functional strengthening and ROM exercises as appropriate.    Everlean Alstrom. Graylon Good, PT, DPT 09/15/21, 9:42 PM  [facility] [address] [place], [place] [address] P: [phone] I F: [phone]

## 2021-09-20 ENCOUNTER — Ambulatory Visit: Payer: Medicare Other | Admitting: Physical Therapy

## 2021-09-22 ENCOUNTER — Encounter: Payer: Self-pay | Admitting: Physical Therapy

## 2021-09-22 ENCOUNTER — Ambulatory Visit: Payer: Medicare Other | Admitting: Physical Therapy

## 2021-09-22 VITALS — BP 120/72 | HR 89

## 2021-09-22 DIAGNOSIS — M5459 Other low back pain: Secondary | ICD-10-CM

## 2021-09-22 DIAGNOSIS — R2681 Unsteadiness on feet: Secondary | ICD-10-CM | POA: Diagnosis not present

## 2021-09-22 DIAGNOSIS — R209 Unspecified disturbances of skin sensation: Secondary | ICD-10-CM

## 2021-09-22 NOTE — Therapy (Signed)
OUTPATIENT PHYSICAL THERAPY TREATMENT NOTE   Patient Name: Devin Landry MRN: [id number] DOB:08-22-1940, 81 y.o., male Today's Date: 09/22/2021  PCP: Baxter Hire, MD REFERRING PROVIDER: Meade Maw, MD  END OF SESSION:   PT End of Session - 09/22/21 0932     Visit Number 2    Number of Visits 24    Date for PT Re-Evaluation 12/08/21    Authorization Type [employer] MEDICARE reporting period from 09/15/2021    Progress Note Due on Visit 10    PT Start Time 0902    PT Stop Time 0945    PT Time Calculation (min) 43 min    Equipment Utilized During Treatment Gait belt    Activity Tolerance Patient tolerated treatment well    Behavior During Therapy [facility] for tasks assessed/performed   verbose, requiring redirection to stay on topic at times            Past Medical History:  Diagnosis Date   Allergic state    Arthritis    BPH (benign prostatic hyperplasia)    Bursitis    hips   Cancer ([place])    basal and squamous cell   Cataracts, bilateral    ED (erectile dysfunction)    Elevated PSA    Heart murmur    born with mumur-asymptomatic   Hemorrhoids    History of chicken pox    History of colon polyps    History of kidney stones    Hypercholesteremia    Hypogonadism in male    Nephrolithiasis    PIN III (prostatic intraepithelial neoplasia III)    Spinal stenosis of lumbar region    Umbilical hernia    Past Surgical History:  Procedure Laterality Date   ANKLE FRACTURE SURGERY Left 10/10/2007   CARPAL TUNNEL RELEASE Right 2007   CATARACT EXTRACTION, BILATERAL Bilateral    COLONOSCOPY     1993, 1996, 2000, 2005,2010, 2015, 2018   COLONOSCOPY WITH PROPOFOL N/A 09/21/2016   Procedure: COLONOSCOPY WITH PROPOFOL;  Surgeon: Manya Silvas, MD;  Location: [facility] ENDOSCOPY;  Service: Endoscopy;  Laterality: N/A;   CYSTOSCOPY W/ RETROGRADES  04/13/2021   Procedure: CYSTOSCOPY WITH RETROGRADE PYELOGRAM;  Surgeon: Abbie Sons, MD;  Location: [facility] ORS;  Service:  Urology;;   CYSTOSCOPY WITH INSERTION OF UROLIFT N/A 07/27/2021   Procedure: CYSTOSCOPY WITH INSERTION OF UROLIFT;  Surgeon: Abbie Sons, MD;  Location: [facility] ORS;  Service: Urology;  Laterality: N/A;   CYSTOSCOPY/URETEROSCOPY/HOLMIUM LASER/STENT PLACEMENT Right 04/13/2021   Procedure: CYSTOSCOPY/URETEROSCOPY/HOLMIUM LASER/STENT PLACEMENT;  Surgeon: Abbie Sons, MD;  Location: [facility] ORS;  Service: Urology;  Laterality: Right;   FEMUR FRACTURE SURGERY Left 10/10/2007   FRACTURE SURGERY Left 10/10/2007   ORIF DISTAL RADIUS FRACTURE AND PERTROCHANTERIC FEMUR FRACTURE   HERNIA REPAIR Bilateral 1964   KNEE SURGERY Left 1984   torn meniscus   KNEE SURGERY Right 2005   torn meniscus   LUMBAR LAMINECTOMY/DECOMPRESSION MICRODISCECTOMY N/A 07/02/2021   Procedure: L2-S1 DECOMPRESSION;  Surgeon: Meade Maw, MD;  Location: [facility] ORS;  Service: Neurosurgery;  Laterality: N/A;   Patient Active Problem List   Diagnosis Date Noted   Status post lumbar spine operation 07/02/2021   Colon polyp 09/19/2018   Other chronic pain 07/24/2017   Pain in joint of right shoulder 05/23/2017   Hx of adenomatous colonic polyps 06/20/2016   Asthma without status asthmaticus 04/23/2014   High cholesterol 04/23/2014   Benign prostatic hyperplasia with lower urinary tract symptoms 07/15/2013   Benign neoplasm of  colon 07/15/2013   ED (erectile dysfunction) of organic origin 12/01/2011   History of elevated PSA 12/01/2011   Nephrolithiasis 12/01/2011   PIN III (prostatic intraepithelial neoplasm III) 12/01/2011    REFERRING DIAG: Imbalance  THERAPY DIAG:  Unsteadiness on feet  Other low back pain  Unspecified disturbances of skin sensation  Rationale for Evaluation and Treatment Rehabilitation  PERTINENT HISTORY: Patient is a 81 y.o. male who presents to outpatient physical therapy with a referral for medical diagnosis imbalance. This patient's chief complaints consist of paresthesia in L > R LE  that affects his balance and ambulation leading to the following functional deficits: difficulty with usual activities including sleeping, lifting, tying shoes, getting dressed, keeping balance while upright, prolonged walking on hard surface, walking without a cane, hobbies, climbing ladders (instructed not to), stairs, playing basketball, running, stopping, athletic endeavors. Relevant past medical history and comorbidities include arthritis, BPH, bursitis of hips, basal and squamous cell cancer, bilateral cataracts, heart murmur (asymptomatic), hemorrhoids, history of kidney stones, history of colon polyps, hypercholesteremia, Prostatic intraepithelial neoplasia III, spinal stenosis of lumbar region, umbilical hernia, L ankle fracture surgery (2009), right carpal tunnel release (2007), cataract extraction bilateral, cytoscopy with insertion of urolift (07/2021), ORIF DISTAL RADIUS FRACTURE AND PERTROCHANTERIC FEMUR FRACTURE (2009), hernia repair (1964), L knee surgery (torn meniscus 1984), R knee surgery (2005, torn meniscus), L2-S1 lumbar decompression including central laminectomy and bilateral medial facetectomies including foraminotomies on 07/02/2021, right shoulder pain.      PRECAUTIONS: don't lift more than 20#, do not twist + lift.   SUBJECTIVE: Patient reports he is going to meet with his neurosurgeon soon and plans do discuss strengthening his back. He states his legs were sore after last PT session.   PAIN:  Are you having pain? no   OBJECTIVE  Vitals:   09/22/21 0906  BP: 120/72  Pulse: 89  SpO2: 96%    [facility] PT Assessment - 09/22/21 0001       Functional Gait  Assessment   Gait assessed  Yes    Gait Level Surface Walks 20 ft in less than 5.5 sec, no assistive devices, good speed, no evidence for imbalance, normal gait pattern, deviates no more than 6 in outside of the 12 in walkway width.   Self selected pace: 5.32 seconds; Fast pace: 3.75 seconds   Change in Gait Speed Able to  smoothly change walking speed without loss of balance or gait deviation. Deviate no more than 6 in outside of the 12 in walkway width.    Gait with Horizontal Head Turns Performs head turns smoothly with slight change in gait velocity (eg, minor disruption to smooth gait path), deviates 6-10 in outside 12 in walkway width, or uses an assistive device.    Gait with Vertical Head Turns Performs task with slight change in gait velocity (eg, minor disruption to smooth gait path), deviates 6 - 10 in outside 12 in walkway width or uses assistive device    Gait and Pivot Turn Pivot turns safely in greater than 3 sec and stops with no loss of balance, or pivot turns safely within 3 sec and stops with mild imbalance, requires small steps to catch balance.    Step Over Obstacle Is able to step over one shoe box (4.5 in total height) without changing gait speed. No evidence of imbalance.    Gait with Narrow Base of Support Ambulates less than 4 steps heel to toe or cannot perform without assistance.    Gait with Eyes  Closed Walks 20 ft, slow speed, abnormal gait pattern, evidence for imbalance, deviates 10-15 in outside 12 in walkway width. Requires more than 9 sec to ambulate 20 ft.   11.82 seconds   Ambulating Backwards Walks 20 ft, uses assistive device, slower speed, mild gait deviations, deviates 6-10 in outside 12 in walkway width.   11.88 seconds   Steps Alternating feet, no rail.    Total Score 20    FGA comment: 19-24 = medium risk fall, no AD used             TODAY'S TREATMENT  Neuromuscular Re-education: to improve, balance, postural strength, muscle activation patterns, and stabilization strength required for functional activities: - FGA testing to assess baseline (see above) - static balance tandem stance to semi-tandem stance, 2x1 min each side with UE support as needed. Moved from TM bar to back in corner and chair in front.   STATIC STANCE WITH BACK TO CORNER and CHAIR IN FRONT: -  narrow stance eyes open, head turns 1x20 each direction (too easy) - half tandem stance, eyes open, head turns, 1x20  each direction with each foot front.  - narrow stance with eyes closed, 1x1 min  -education about how to complete at home including with handout.   - education about return to walking and wall sits.   Pt required multimodal cuing for proper technique and to facilitate improved neuromuscular control, strength, range of motion, and functional ability resulting in improved performance and form.   PATIENT EDUCATION:  Education details:  Exercise purpose/form. Self management techniques. Education on HEP including handout. Person educated: Patient Education method: Explanation and Demonstration, verbal and tactile cuing.  Education comprehension: verbalized understanding, returned demonstration and needs further education     HOME EXERCISE PROGRAM: Access Code: [email or web address] URL: [email or web address] Date: 09/22/2021 Prepared by: Rosita Kea  Exercises - Tandem Stance in Corner  - 1 x daily - 3 reps - 1 min hold - Corner Balance Feet Together With Eyes Closed  - 1 x daily - 3 reps - 1 min hold - Half Tandem Stance Balance with Head Rotation  - 1 x daily - 2-3 sets - 20 reps   ASSESSMENT:   CLINICAL IMPRESSION: Patient arrives with stooped posture and SPC which he did not use during the session. Patient scored 20/30 on the FGA, fitting the classification of moderate fall risk. Today's session also focused on establishing an initial HEP that consisted of static balance under various conditions. Patient appeared to understand exercises in HEP and be able to perform them with sufficient safety awareness. Patient is eager to return to walking and go back to exercising in the gym. He plans to speak with MD about his restrictions in the context of returning to some of these activities. Patient did report worsening of paresthesia and feeling of tightness in the L LE that  was affecting his balance during the session but he did not want to sit and rest. Plan to continue with exercises for balance and LE, core, and functional strengthening as appropriate next session. Patient may also benefit from nerve glides to be introduced next session. Patient would benefit from continued management of limiting condition by skilled physical therapist to address remaining impairments and functional limitations to work towards stated goals and return to PLOF or maximal functional independence.   From PT eval on 09/15/2021: Patient is a 81 y.o. male referred to outpatient physical therapy with a medical diagnosis of imbalance who presents with signs and symptoms consistent  with unsteadiness on feet and difficulty walking s/p L2-S1 lumbar decompression including central laminectomy and bilateral medial facetectomies including foraminotomies on 07/02/2021. Patient has loss of sensation on L LE contributing to unsteadiness as well as altered back, hip, and knee posture and range of motion. Patient presents with significant pain, paresthesia, sensation, ROM, posture, joint stiffness, balance, gait, muscle performance (strength/power/endurance), activity tolerance, motor control, and proprioception impairments that are limiting ability to complete his usual activities including sleeping, lifting, tying shoes, getting dressed, keeping balance while upright, prolonged walking on hard surface, walking without a cane, hobbies, climbing ladders (instructed not to), stairs, playing basketball, running, stopping, athletic endeavors without difficulty. Patient will benefit from skilled physical therapy intervention to address current body structure impairments and activity limitations to improve function and work towards goals set in current POC in order to return to prior level of function or maximal functional improvement.    OBJECTIVE IMPAIRMENTS Abnormal gait, decreased activity tolerance, decreased  balance, decreased endurance, decreased knowledge of condition, decreased knowledge of use of DME, decreased mobility, difficulty walking, decreased ROM, decreased strength, hypomobility, increased fascial restrictions, impaired perceived functional ability, increased muscle spasms, impaired flexibility, impaired sensation, improper body mechanics, postural dysfunction, and pain.    ACTIVITY LIMITATIONS carrying, lifting, bending, standing, squatting, sleeping, stairs, transfers, bed mobility, dressing, and locomotion level   PARTICIPATION LIMITATIONS: interpersonal relationship, driving, community activity, yard work, and   usual activities including sleeping, lifting, tying shoes, getting dressed, keeping balance while upright, prolonged walking on hard surface, walking without a cane, hobbies, climbing ladders (instructed not to), stairs, playing basketball, running, stopping, athletic endeavors   PERSONAL FACTORS Age, Behavior pattern, Past/current experiences, Profession, Time since onset of injury/illness/exacerbation, and 3+ comorbidities:   arthritis, BPH, bursitis of hips, basal and squamous cell cancer, bilateral cataracts, heart murmur (asymptomatic), hemorrhoids, history of kidney stones, history of colon polyps, hypercholesteremia, Prostatic intraepithelial neoplasia III, spinal stenosis of lumbar region, umbilical hernia, L ankle fracture surgery (2009), right carpal tunnel release (2007), cataract extraction bilateral, cytoscopy with insertion of urolift (07/2021), ORIF DISTAL RADIUS FRACTURE AND PERTROCHANTERIC FEMUR FRACTURE (2009), hernia repair (1964), L knee surgery (torn meniscus 1984), R knee surgery (2005, torn meniscus), L2-S1 lumbar decompression including central laminectomy and bilateral medial facetectomies including foraminotomies on 07/02/2021, right shoulder pain are also affecting patient's functional outcome.    REHAB POTENTIAL: Good   CLINICAL DECISION MAKING:  Stable/uncomplicated   EVALUATION COMPLEXITY: Low     GOALS: Goals reviewed with patient? No   SHORT TERM GOALS: Target date: 09/29/2021   Patient will be independent with initial home exercise program for self-management of symptoms. Baseline: Initial HEP to be provided at visit 2 as appropriate (09/15/21); initial HEP provided visit 2 (09/22/2021);  Goal status: In-progress     LONG TERM GOALS: Target date: 12/08/2021   Patient will be independent with a long-term home exercise program for self-management of symptoms.  Baseline: Initial HEP to be provided at visit 2 as appropriate (09/15/21); initial HEP provided visit 2 (09/22/2021);  Goal status: In-progress   2.  Patient will demonstrate improved FOTO to equal or greater than 60 by visit #13 to demonstrate improvement in overall condition and self-reported functional ability.  Baseline: 56 (09/15/21); Goal status: In-progress   3.  Patient will demonstrate the ability to stand in tandem stance for equal or greater than 30 seconds with each foot forwards to demonstrate decreased fall risk.  Baseline: R = 9 seconds; L = 3 seconds (09/15/21); Goal status: In-progress  4.  Patient will score equal or greater than 25/30 on Functional Gait Assessment to demonstrate low fall risk (09/15/2021);  Baseline: to be tested visit 2 as appropriate (09/15/21); 20/30 moderate fall risk (09/22/2021);  Goal status: In-progress   5.  Patient will complete community, work and/or recreational activities without limitation due to current condition.  Baseline: difficulty with usual activities including sleeping, lifting, tying shoes, getting dressed, keeping balance while upright, prolonged walking on hard surface, walking without a cane, hobbies, climbing ladders (instructed not to), stairs, playing basketball, running, stopping, athletic endeavors (09/15/21); Goal status: In-progress   PLAN: PT FREQUENCY: 1-2x/week   PT DURATION: 12 weeks    PLANNED INTERVENTIONS: Therapeutic exercises, Therapeutic activity, Neuromuscular re-education, Balance training, Gait training, Patient/Family education, Self Care, Joint mobilization, Stair training, DME instructions, Electrical stimulation, Spinal mobilization, Cryotherapy, Moist heat, Manual therapy, and Re-evaluation.   PLAN FOR NEXT SESSION: update HEP as appropriate, balance interventions, LE and functional strengthening and ROM exercises as appropriate.   Nancy Nordmann, PT, DPT 09/22/2021, 11:04 AM  [facility] [address] [place], [place] [address] P: [phone] I F: [phone]

## 2021-09-28 ENCOUNTER — Encounter: Payer: Self-pay | Admitting: Neurosurgery

## 2021-09-28 ENCOUNTER — Encounter: Payer: Medicare Other | Admitting: Physical Therapy

## 2021-09-28 ENCOUNTER — Ambulatory Visit (INDEPENDENT_AMBULATORY_CARE_PROVIDER_SITE_OTHER): Payer: Medicare Other | Admitting: Neurosurgery

## 2021-09-28 VITALS — BP 132/80 | Ht 72.0 in | Wt 210.4 lb

## 2021-09-28 DIAGNOSIS — M48062 Spinal stenosis, lumbar region with neurogenic claudication: Secondary | ICD-10-CM

## 2021-09-28 DIAGNOSIS — Z09 Encounter for follow-up examination after completed treatment for conditions other than malignant neoplasm: Secondary | ICD-10-CM

## 2021-09-28 NOTE — Progress Notes (Signed)
   DOS: 07/02/21 (L2-S1 decompression)  HISTORY OF PRESENT ILLNESS: 09/28/2021 Mr. Devin Landry is status post.  He is doing very well.  His numbness has started to improve.  He is working with physical therapy.  He is doing water walking every morning.     PHYSICAL EXAMINATION:   Vitals:   09/28/21 1101  BP: 132/80   General: Patient is well developed, well nourished, calm, collected, and in no apparent distress.  NEUROLOGICAL:  General: In no acute distress.  Awake, alert, oriented to person, place, and time. Pupils equal round and reactive to light.   Strength:  Side Iliopsoas Quads Hamstring PF DF EHL  R '5 5 5 5 5 5  '$ L '5 5 5 5 5 5   '$ Incision c/d/i   ROS (Neurologic): Negative except as noted above  IMAGING: No interval imaging to review   ASSESSMENT/PLAN:  Devin Landry is doing well after lumbar decompression.  His pain is much improved.  He continues to have numbness as well as some imbalance.  He will continue to work with physical therapy and exercise.  He is off all restrictions.  I will see him back on an as-needed basis. I spent a total of 10 minutes in face-to-face and non-face-to-face activities related to this patient's care today.   Meade Maw MD, [facility] Department of Neurosurgery

## 2021-09-30 ENCOUNTER — Ambulatory Visit: Payer: Medicare Other | Admitting: Physical Therapy

## 2021-09-30 ENCOUNTER — Encounter: Payer: Self-pay | Admitting: Physical Therapy

## 2021-09-30 DIAGNOSIS — R2681 Unsteadiness on feet: Secondary | ICD-10-CM

## 2021-09-30 DIAGNOSIS — R209 Unspecified disturbances of skin sensation: Secondary | ICD-10-CM

## 2021-09-30 DIAGNOSIS — M5459 Other low back pain: Secondary | ICD-10-CM

## 2021-09-30 NOTE — Therapy (Signed)
OUTPATIENT PHYSICAL THERAPY TREATMENT NOTE   Patient Name: Devin Landry MRN: [id number] DOB:1940/12/05, 81 y.o., male Today's Date: 09/30/2021  PCP: Baxter Hire, MD REFERRING PROVIDER: Meade Maw, MD  END OF SESSION:   PT End of Session - 09/30/21 1954     Visit Number 3    Number of Visits 24    Date for PT Re-Evaluation 12/08/21    Authorization Type [employer] MEDICARE reporting period from 09/15/2021    Progress Note Due on Visit 10    PT Start Time 1650    PT Stop Time 1730    PT Time Calculation (min) 40 min    Equipment Utilized During Treatment Gait belt    Activity Tolerance Patient tolerated treatment well    Behavior During Therapy [facility] for tasks assessed/performed   verbose, requiring redirection to stay on topic at times             Past Medical History:  Diagnosis Date   Allergic state    Arthritis    BPH (benign prostatic hyperplasia)    Bursitis    hips   Cancer ([place])    basal and squamous cell   Cataracts, bilateral    ED (erectile dysfunction)    Elevated PSA    Heart murmur    born with mumur-asymptomatic   Hemorrhoids    History of chicken pox    History of colon polyps    History of kidney stones    Hypercholesteremia    Hypogonadism in male    Nephrolithiasis    PIN III (prostatic intraepithelial neoplasia III)    Spinal stenosis of lumbar region    Umbilical hernia    Past Surgical History:  Procedure Laterality Date   ANKLE FRACTURE SURGERY Left 10/10/2007   CARPAL TUNNEL RELEASE Right 2007   CATARACT EXTRACTION, BILATERAL Bilateral    COLONOSCOPY     1993, 1996, 2000, 2005,2010, 2015, 2018   COLONOSCOPY WITH PROPOFOL N/A 09/21/2016   Procedure: COLONOSCOPY WITH PROPOFOL;  Surgeon: Manya Silvas, MD;  Location: [facility] ENDOSCOPY;  Service: Endoscopy;  Laterality: N/A;   CYSTOSCOPY W/ RETROGRADES  04/13/2021   Procedure: CYSTOSCOPY WITH RETROGRADE PYELOGRAM;  Surgeon: Abbie Sons, MD;  Location: [facility] ORS;  Service:  Urology;;   CYSTOSCOPY WITH INSERTION OF UROLIFT N/A 07/27/2021   Procedure: CYSTOSCOPY WITH INSERTION OF UROLIFT;  Surgeon: Abbie Sons, MD;  Location: [facility] ORS;  Service: Urology;  Laterality: N/A;   CYSTOSCOPY/URETEROSCOPY/HOLMIUM LASER/STENT PLACEMENT Right 04/13/2021   Procedure: CYSTOSCOPY/URETEROSCOPY/HOLMIUM LASER/STENT PLACEMENT;  Surgeon: Abbie Sons, MD;  Location: [facility] ORS;  Service: Urology;  Laterality: Right;   FEMUR FRACTURE SURGERY Left 10/10/2007   FRACTURE SURGERY Left 10/10/2007   ORIF DISTAL RADIUS FRACTURE AND PERTROCHANTERIC FEMUR FRACTURE   HERNIA REPAIR Bilateral 1964   KNEE SURGERY Left 1984   torn meniscus   KNEE SURGERY Right 2005   torn meniscus   LUMBAR LAMINECTOMY/DECOMPRESSION MICRODISCECTOMY N/A 07/02/2021   Procedure: L2-S1 DECOMPRESSION;  Surgeon: Meade Maw, MD;  Location: [facility] ORS;  Service: Neurosurgery;  Laterality: N/A;   Patient Active Problem List   Diagnosis Date Noted   Status post lumbar spine operation 07/02/2021   Colon polyp 09/19/2018   Other chronic pain 07/24/2017   Pain in joint of right shoulder 05/23/2017   Hx of adenomatous colonic polyps 06/20/2016   Asthma without status asthmaticus 04/23/2014   High cholesterol 04/23/2014   Benign prostatic hyperplasia with lower urinary tract symptoms 07/15/2013   Benign neoplasm  of colon 07/15/2013   ED (erectile dysfunction) of organic origin 12/01/2011   History of elevated PSA 12/01/2011   Nephrolithiasis 12/01/2011   PIN III (prostatic intraepithelial neoplasm III) 12/01/2011    REFERRING DIAG: Imbalance  THERAPY DIAG:  Unsteadiness on feet  Other low back pain  Unspecified disturbances of skin sensation  Rationale for Evaluation and Treatment Rehabilitation  PERTINENT HISTORY: Patient is a 81 y.o. male who presents to outpatient physical therapy with a referral for medical diagnosis imbalance. This patient's chief complaints consist of paresthesia in L > R LE  that affects his balance and ambulation leading to the following functional deficits: difficulty with usual activities including sleeping, lifting, tying shoes, getting dressed, keeping balance while upright, prolonged walking on hard surface, walking without a cane, hobbies, climbing ladders (instructed not to), stairs, playing basketball, running, stopping, athletic endeavors. Relevant past medical history and comorbidities include arthritis, BPH, bursitis of hips, basal and squamous cell cancer, bilateral cataracts, heart murmur (asymptomatic), hemorrhoids, history of kidney stones, history of colon polyps, hypercholesteremia, Prostatic intraepithelial neoplasia III, spinal stenosis of lumbar region, umbilical hernia, L ankle fracture surgery (2009), right carpal tunnel release (2007), cataract extraction bilateral, cytoscopy with insertion of urolift (07/2021), ORIF DISTAL RADIUS FRACTURE AND PERTROCHANTERIC FEMUR FRACTURE (2009), hernia repair (1964), L knee surgery (torn meniscus 1984), R knee surgery (2005, torn meniscus), L2-S1 lumbar decompression including central laminectomy and bilateral medial facetectomies including foraminotomies on 07/02/2021, right shoulder pain.      PRECAUTIONS: lifted  SUBJECTIVE: Patient reports he is feeling well. He states he has increased his water walking to daily. He saw his surgeon who lifted his restrictions and didn't need to see him again. He states he would like to build some strength. Patient states he would like to continue working on balance. He did find out his surgeon is unsure if the nerves in his left LE will return. He is states he has not seen much improvement in how well he is able to do his balance exercises. He has been doing his HEP daily. He does some in the morning and some in the evening.   PAIN:  Are you having pain? no   OBJECTIVE  TODAY'S TREATMENT  Neuromuscular Re-education: to improve, balance, postural strength, muscle activation  patterns, and stabilization strength required for functional activities: - tandem stance, 1x1 min each side. SBA - half tandem stance, eyes open, head turns, 1x20  each direction with each foot front. SBA - narrow stance with eyes closed, 1x1 min, SBA - lateral walking on 5-foot airex beam, 2x5 each direction, UE support available as needed, CGA.  - cone tip/right with foot, 1x10 each side, no UE support, CGA - forwards/backwards step over 6-inch hurdle, 1x10 with each foot leading, CGA.  - standing alternating 360 turns with hand tap on TM bar, 1x10 each direction, SBA.   Therapeutic exercise: to centralize symptoms and improve ROM, strength, muscular endurance, and activity tolerance required for successful completion of functional activities.  - sit <> stand holding ball with ball slam/retrival between each sit, from 17 inch (grey) chair, 1x10 with 3kg med ball, 1x3 with 15# slam ball.   Pt required multimodal cuing for proper technique and to facilitate improved neuromuscular control, strength, range of motion, and functional ability resulting in improved performance and form.   PATIENT EDUCATION:  Education details:  Exercise purpose/form. Self management techniques.  Person educated: Patient Education method: Explanation and Demonstration, verbal and tactile cuing.  Education comprehension: verbalized understanding, returned  demonstration and needs further education     HOME EXERCISE PROGRAM: Access Code: [email or web address] URL: [email or web address] Date: 09/22/2021 Prepared by: Rosita Kea  Exercises - Tandem Stance in Corner  - 1 x daily - 3 reps - 1 min hold - Corner Balance Feet Together With Eyes Closed  - 1 x daily - 3 reps - 1 min hold - Half Tandem Stance Balance with Head Rotation  - 1 x daily - 2-3 sets - 20 reps   ASSESSMENT:   CLINICAL IMPRESSION: Patient tolerated treatment well overall with no complaint of increased pain by end of session. Patient did  report tightening of the L calf during session and complained of feeling less steady when standing on L LE. Patient was appropriately challenged by exercises. Introduced sit <> stand with ball slam to help with functional strengthening of back and LE. Patient required guarding for safety and lost his balance intermittently, usually able to correct with step strategy or holding on to nearby support. He continues to maintain a flexed posture at the hips and lumbar spine. Patient would benefit from continued management of limiting condition by skilled physical therapist to address remaining impairments and functional limitations to work towards stated goals and return to PLOF or maximal functional independence.   From PT eval on 09/15/2021: Patient is a 81 y.o. male referred to outpatient physical therapy with a medical diagnosis of imbalance who presents with signs and symptoms consistent with unsteadiness on feet and difficulty walking s/p L2-S1 lumbar decompression including central laminectomy and bilateral medial facetectomies including foraminotomies on 07/02/2021. Patient has loss of sensation on L LE contributing to unsteadiness as well as altered back, hip, and knee posture and range of motion. Patient presents with significant pain, paresthesia, sensation, ROM, posture, joint stiffness, balance, gait, muscle performance (strength/power/endurance), activity tolerance, motor control, and proprioception impairments that are limiting ability to complete his usual activities including sleeping, lifting, tying shoes, getting dressed, keeping balance while upright, prolonged walking on hard surface, walking without a cane, hobbies, climbing ladders (instructed not to), stairs, playing basketball, running, stopping, athletic endeavors without difficulty. Patient will benefit from skilled physical therapy intervention to address current body structure impairments and activity limitations to improve function and work  towards goals set in current POC in order to return to prior level of function or maximal functional improvement.    OBJECTIVE IMPAIRMENTS Abnormal gait, decreased activity tolerance, decreased balance, decreased endurance, decreased knowledge of condition, decreased knowledge of use of DME, decreased mobility, difficulty walking, decreased ROM, decreased strength, hypomobility, increased fascial restrictions, impaired perceived functional ability, increased muscle spasms, impaired flexibility, impaired sensation, improper body mechanics, postural dysfunction, and pain.    ACTIVITY LIMITATIONS carrying, lifting, bending, standing, squatting, sleeping, stairs, transfers, bed mobility, dressing, and locomotion level   PARTICIPATION LIMITATIONS: interpersonal relationship, driving, community activity, yard work, and   usual activities including sleeping, lifting, tying shoes, getting dressed, keeping balance while upright, prolonged walking on hard surface, walking without a cane, hobbies, climbing ladders (instructed not to), stairs, playing basketball, running, stopping, athletic endeavors   PERSONAL FACTORS Age, Behavior pattern, Past/current experiences, Profession, Time since onset of injury/illness/exacerbation, and 3+ comorbidities:   arthritis, BPH, bursitis of hips, basal and squamous cell cancer, bilateral cataracts, heart murmur (asymptomatic), hemorrhoids, history of kidney stones, history of colon polyps, hypercholesteremia, Prostatic intraepithelial neoplasia III, spinal stenosis of lumbar region, umbilical hernia, L ankle fracture surgery (2009), right carpal tunnel release (2007), cataract extraction bilateral, cytoscopy with insertion of urolift (07/2021), ORIF  DISTAL RADIUS FRACTURE AND PERTROCHANTERIC FEMUR FRACTURE (2009), hernia repair (1964), L knee surgery (torn meniscus 1984), R knee surgery (2005, torn meniscus), L2-S1 lumbar decompression including central laminectomy and bilateral  medial facetectomies including foraminotomies on 07/02/2021, right shoulder pain are also affecting patient's functional outcome.    REHAB POTENTIAL: Good   CLINICAL DECISION MAKING: Stable/uncomplicated   EVALUATION COMPLEXITY: Low     GOALS: Goals reviewed with patient? No   SHORT TERM GOALS: Target date: 09/29/2021   Patient will be independent with initial home exercise program for self-management of symptoms. Baseline: Initial HEP to be provided at visit 2 as appropriate (09/15/21); initial HEP provided visit 2 (09/22/2021);  Goal status: In-progress     LONG TERM GOALS: Target date: 12/08/2021   Patient will be independent with a long-term home exercise program for self-management of symptoms.  Baseline: Initial HEP to be provided at visit 2 as appropriate (09/15/21); initial HEP provided visit 2 (09/22/2021);  Goal status: In-progress   2.  Patient will demonstrate improved FOTO to equal or greater than 60 by visit #13 to demonstrate improvement in overall condition and self-reported functional ability.  Baseline: 56 (09/15/21); Goal status: In-progress   3.  Patient will demonstrate the ability to stand in tandem stance for equal or greater than 30 seconds with each foot forwards to demonstrate decreased fall risk.  Baseline: R = 9 seconds; L = 3 seconds (09/15/21); Goal status: In-progress   4.  Patient will score equal or greater than 25/30 on Functional Gait Assessment to demonstrate low fall risk (09/15/2021);  Baseline: to be tested visit 2 as appropriate (09/15/21); 20/30 moderate fall risk (09/22/2021);  Goal status: In-progress   5.  Patient will complete community, work and/or recreational activities without limitation due to current condition.  Baseline: difficulty with usual activities including sleeping, lifting, tying shoes, getting dressed, keeping balance while upright, prolonged walking on hard surface, walking without a cane, hobbies, climbing ladders  (instructed not to), stairs, playing basketball, running, stopping, athletic endeavors (09/15/21); Goal status: In-progress   PLAN: PT FREQUENCY: 1-2x/week   PT DURATION: 12 weeks   PLANNED INTERVENTIONS: Therapeutic exercises, Therapeutic activity, Neuromuscular re-education, Balance training, Gait training, Patient/Family education, Self Care, Joint mobilization, Stair training, DME instructions, Electrical stimulation, Spinal mobilization, Cryotherapy, Moist heat, Manual therapy, and Re-evaluation.   PLAN FOR NEXT SESSION: update HEP as appropriate, balance interventions, LE and functional strengthening and ROM exercises as appropriate.   Nancy Nordmann, PT, DPT 09/30/2021, 8:01 PM  [facility] [address] [place],  [address] P: [phone] I F: [phone]

## 2021-10-02 ENCOUNTER — Other Ambulatory Visit: Payer: Self-pay | Admitting: Urology

## 2021-10-02 DIAGNOSIS — N401 Enlarged prostate with lower urinary tract symptoms: Secondary | ICD-10-CM

## 2021-10-04 ENCOUNTER — Ambulatory Visit: Payer: Medicare Other | Admitting: Physical Therapy

## 2021-10-04 ENCOUNTER — Encounter: Payer: Self-pay | Admitting: Physical Therapy

## 2021-10-04 DIAGNOSIS — R2681 Unsteadiness on feet: Secondary | ICD-10-CM | POA: Diagnosis not present

## 2021-10-04 DIAGNOSIS — M5459 Other low back pain: Secondary | ICD-10-CM

## 2021-10-04 DIAGNOSIS — R209 Unspecified disturbances of skin sensation: Secondary | ICD-10-CM

## 2021-10-04 NOTE — Therapy (Signed)
OUTPATIENT PHYSICAL THERAPY TREATMENT NOTE   Patient Name: Devin Landry MRN: [id number] DOB:[birth year], 81 y.o., male Today's Date: 10/04/2021  PCP: Baxter Hire, MD REFERRING PROVIDER: Meade Maw, MD  END OF SESSION:   PT End of Session - 10/04/21 1347     Visit Number 4    Number of Visits 24    Date for PT Re-Evaluation 12/08/21    Authorization Type [employer] MEDICARE reporting period from 09/15/2021    Progress Note Due on Visit 10    PT Start Time 1345    PT Stop Time 1425    PT Time Calculation (min) 40 min    Equipment Utilized During Treatment Gait belt    Activity Tolerance Patient tolerated treatment well    Behavior During Therapy WFL for tasks assessed/performed   verbose, requiring redirection to stay on topic at times              Past Medical History:  Diagnosis Date   Allergic state    Arthritis    BPH (benign prostatic hyperplasia)    Bursitis    hips   Cancer ([place])    basal and squamous cell   Cataracts, bilateral    ED (erectile dysfunction)    Elevated PSA    Heart murmur    born with mumur-asymptomatic   Hemorrhoids    History of chicken pox    History of colon polyps    History of kidney stones    Hypercholesteremia    Hypogonadism in male    Nephrolithiasis    PIN III (prostatic intraepithelial neoplasia III)    Spinal stenosis of lumbar region    Umbilical hernia    Past Surgical History:  Procedure Laterality Date   ANKLE FRACTURE SURGERY Left 10/10/2007   CARPAL TUNNEL RELEASE Right 2007   CATARACT EXTRACTION, BILATERAL Bilateral    COLONOSCOPY     1993, 1996, 2000, 2005,2010, 2015, 2018   COLONOSCOPY WITH PROPOFOL N/A 09/21/2016   Procedure: COLONOSCOPY WITH PROPOFOL;  Surgeon: Manya Silvas, MD;  Location: [facility] ENDOSCOPY;  Service: Endoscopy;  Laterality: N/A;   CYSTOSCOPY W/ RETROGRADES  04/13/2021   Procedure: CYSTOSCOPY WITH RETROGRADE PYELOGRAM;  Surgeon: Abbie Sons, MD;  Location: [facility] ORS;  Service:  Urology;;   CYSTOSCOPY WITH INSERTION OF UROLIFT N/A 07/27/2021   Procedure: CYSTOSCOPY WITH INSERTION OF UROLIFT;  Surgeon: Abbie Sons, MD;  Location: [facility] ORS;  Service: Urology;  Laterality: N/A;   CYSTOSCOPY/URETEROSCOPY/HOLMIUM LASER/STENT PLACEMENT Right 04/13/2021   Procedure: CYSTOSCOPY/URETEROSCOPY/HOLMIUM LASER/STENT PLACEMENT;  Surgeon: Abbie Sons, MD;  Location: [facility] ORS;  Service: Urology;  Laterality: Right;   FEMUR FRACTURE SURGERY Left 10/10/2007   FRACTURE SURGERY Left 10/10/2007   ORIF DISTAL RADIUS FRACTURE AND PERTROCHANTERIC FEMUR FRACTURE   HERNIA REPAIR Bilateral 1964   KNEE SURGERY Left 1984   torn meniscus   KNEE SURGERY Right 2005   torn meniscus   LUMBAR LAMINECTOMY/DECOMPRESSION MICRODISCECTOMY N/A 07/02/2021   Procedure: L2-S1 DECOMPRESSION;  Surgeon: Meade Maw, MD;  Location: [facility] ORS;  Service: Neurosurgery;  Laterality: N/A;   Patient Active Problem List   Diagnosis Date Noted   Status post lumbar spine operation 07/02/2021   Colon polyp 09/19/2018   Other chronic pain 07/24/2017   Pain in joint of right shoulder 05/23/2017   Hx of adenomatous colonic polyps 06/20/2016   Asthma without status asthmaticus 04/23/2014   High cholesterol 04/23/2014   Benign prostatic hyperplasia with lower urinary tract symptoms 07/15/2013   Benign  neoplasm of colon 07/15/2013   ED (erectile dysfunction) of organic origin 12/01/2011   History of elevated PSA 12/01/2011   Nephrolithiasis 12/01/2011   PIN III (prostatic intraepithelial neoplasm III) 12/01/2011    REFERRING DIAG: Imbalance  THERAPY DIAG:  Unsteadiness on feet  Other low back pain  Unspecified disturbances of skin sensation  Rationale for Evaluation and Treatment Rehabilitation  PERTINENT HISTORY: Patient is a 81 y.o. male who presents to outpatient physical therapy with a referral for medical diagnosis imbalance. This patient's chief complaints consist of paresthesia in L > R LE  that affects his balance and ambulation leading to the following functional deficits: difficulty with usual activities including sleeping, lifting, tying shoes, getting dressed, keeping balance while upright, prolonged walking on hard surface, walking without a cane, hobbies, climbing ladders (instructed not to), stairs, playing basketball, running, stopping, athletic endeavors. Relevant past medical history and comorbidities include arthritis, BPH, bursitis of hips, basal and squamous cell cancer, bilateral cataracts, heart murmur (asymptomatic), hemorrhoids, history of kidney stones, history of colon polyps, hypercholesteremia, Prostatic intraepithelial neoplasia III, spinal stenosis of lumbar region, umbilical hernia, L ankle fracture surgery (2009), right carpal tunnel release (2007), cataract extraction bilateral, cytoscopy with insertion of urolift (07/2021), ORIF DISTAL RADIUS FRACTURE AND PERTROCHANTERIC FEMUR FRACTURE (2009), hernia repair (1964), L knee surgery (torn meniscus 1984), R knee surgery (2005, torn meniscus), L2-S1 lumbar decompression including central laminectomy and bilateral medial facetectomies including foraminotomies on 07/02/2021, right shoulder pain.      PRECAUTIONS: lifted  SUBJECTIVE: Patient reports no pain upon arrival. He reports he got a med ball that won't bounce off the floor. He thinks it is 10#. He was a little sore in the calves and across the front of the right knee after last PT session. He also has some ankle weights that are adjustable but are about 4.5#. He also has dumbbells. He has been water walking this morning. He denies falls and arrives with [facility]. He has been doing his HEP. He feels like he is able to pick things up off the floor a little easier.   PAIN:  Are you having pain? no   OBJECTIVE  TODAY'S TREATMENT  Neuromuscular Re-education: to improve, balance, postural strength, muscle activation patterns, and stabilization strength required for  functional activities: - backwards walking, 4x30 feet, CGA - lateral walking on 5-foot airex beam, 2x5 each direction, no UE support,  CGA.  - step standing pallof press with one foot on 8.5 inch stool, 1x10 each foot front on each side. CGA. (Challenging).  - forwards/backwards step over 6-inch hurdle while wearing 3#AW each foot, 1x10 with each foot leading, intermittent UE support,  CGA.   Therapeutic exercise: to centralize symptoms and improve ROM, strength, muscular endurance, and activity tolerance required for successful completion of functional activities.  - sit <> stand holding ball with ball slam/retrival between each sit, from 17 inch (grey) chair, 1x10 with 3kg med ball.  - modified mountain climbers with B UE on TM bar and feet approx 3.5 feet back from bar, 1x10 AROM, 1x10 with 3# AW.   Pt required multimodal cuing for proper technique and to facilitate improved neuromuscular control, strength, range of motion, and functional ability resulting in improved performance and form.   PATIENT EDUCATION:  Education details:  Exercise purpose/form. Self management techniques.  Person educated: Patient Education method: Explanation and Demonstration, verbal and tactile cuing.  Education comprehension: verbalized understanding, returned demonstration and needs further education     HOME EXERCISE PROGRAM: Access  Code: [email or web address] URL: [email or web address] Date: 10/04/2021 Prepared by: Rosita Kea  Exercises - Tandem Stance in Corner  - 1 x daily - 3 reps - 1 min hold - Corner Balance Feet Together With Eyes Closed  - 1 x daily - 3 reps - 1 min hold - Half Tandem Stance Balance with Head Rotation  - 1 x daily - 2-3 sets - 20 reps - [employer] on Counter  - 1 x daily - 3 sets - 10 reps - 5 seconds hold - Single-Leg Anti-Rotation Press With Anchored Resistance  - 3 x weekly - 1-2 sets - 10 reps   ASSESSMENT:   CLINICAL IMPRESSION: Patient tolerated treatment  well overall but did have some report of discomfort at R anterior knee and feeling on instability in R knee. Session continued to focus on challenging patient's balance and incorporated some core strengthening. Patient continues to be quite stooped during session and would benefit from further attention to his posture in the future. He may also benefit from further LE strengthening but may be limited by knee irritation. Pateint very interested in reproducing exercises at home for HEP. Provided updated HEP that includes similar exercises to clinic with education on safety. He states his daughter is an Therapist, sports and can guard him at home. He found step standing pallof press very challenging to balance, especially when standing on L LE. Patient would benefit from continued management of limiting condition by skilled physical therapist to address remaining impairments and functional limitations to work towards stated goals and return to PLOF or maximal functional independence.    From PT eval on 09/15/2021: Patient is a 81 y.o. male referred to outpatient physical therapy with a medical diagnosis of imbalance who presents with signs and symptoms consistent with unsteadiness on feet and difficulty walking s/p L2-S1 lumbar decompression including central laminectomy and bilateral medial facetectomies including foraminotomies on 07/02/2021. Patient has loss of sensation on L LE contributing to unsteadiness as well as altered back, hip, and knee posture and range of motion. Patient presents with significant pain, paresthesia, sensation, ROM, posture, joint stiffness, balance, gait, muscle performance (strength/power/endurance), activity tolerance, motor control, and proprioception impairments that are limiting ability to complete his usual activities including sleeping, lifting, tying shoes, getting dressed, keeping balance while upright, prolonged walking on hard surface, walking without a cane, hobbies, climbing ladders  (instructed not to), stairs, playing basketball, running, stopping, athletic endeavors without difficulty. Patient will benefit from skilled physical therapy intervention to address current body structure impairments and activity limitations to improve function and work towards goals set in current POC in order to return to prior level of function or maximal functional improvement.    OBJECTIVE IMPAIRMENTS Abnormal gait, decreased activity tolerance, decreased balance, decreased endurance, decreased knowledge of condition, decreased knowledge of use of DME, decreased mobility, difficulty walking, decreased ROM, decreased strength, hypomobility, increased fascial restrictions, impaired perceived functional ability, increased muscle spasms, impaired flexibility, impaired sensation, improper body mechanics, postural dysfunction, and pain.    ACTIVITY LIMITATIONS carrying, lifting, bending, standing, squatting, sleeping, stairs, transfers, bed mobility, dressing, and locomotion level   PARTICIPATION LIMITATIONS: interpersonal relationship, driving, community activity, yard work, and   usual activities including sleeping, lifting, tying shoes, getting dressed, keeping balance while upright, prolonged walking on hard surface, walking without a cane, hobbies, climbing ladders (instructed not to), stairs, playing basketball, running, stopping, athletic endeavors   PERSONAL FACTORS Age, Behavior pattern, Past/current experiences, Profession, Time since onset of injury/illness/exacerbation, and 3+ comorbidities:   arthritis,  BPH, bursitis of hips, basal and squamous cell cancer, bilateral cataracts, heart murmur (asymptomatic), hemorrhoids, history of kidney stones, history of colon polyps, hypercholesteremia, Prostatic intraepithelial neoplasia III, spinal stenosis of lumbar region, umbilical hernia, L ankle fracture surgery (2009), right carpal tunnel release (2007), cataract extraction bilateral, cytoscopy with  insertion of urolift (07/2021), ORIF DISTAL RADIUS FRACTURE AND PERTROCHANTERIC FEMUR FRACTURE (2009), hernia repair (1964), L knee surgery (torn meniscus 1984), R knee surgery (2005, torn meniscus), L2-S1 lumbar decompression including central laminectomy and bilateral medial facetectomies including foraminotomies on 07/02/2021, right shoulder pain are also affecting patient's functional outcome.    REHAB POTENTIAL: Good   CLINICAL DECISION MAKING: Stable/uncomplicated   EVALUATION COMPLEXITY: Low     GOALS: Goals reviewed with patient? No   SHORT TERM GOALS: Target date: 09/29/2021   Patient will be independent with initial home exercise program for self-management of symptoms. Baseline: Initial HEP to be provided at visit 2 as appropriate (09/15/21); initial HEP provided visit 2 (09/22/2021);  Goal status: In-progress     LONG TERM GOALS: Target date: 12/08/2021   Patient will be independent with a long-term home exercise program for self-management of symptoms.  Baseline: Initial HEP to be provided at visit 2 as appropriate (09/15/21); initial HEP provided visit 2 (09/22/2021);  Goal status: In-progress   2.  Patient will demonstrate improved FOTO to equal or greater than 60 by visit #13 to demonstrate improvement in overall condition and self-reported functional ability.  Baseline: 56 (09/15/21); Goal status: In-progress   3.  Patient will demonstrate the ability to stand in tandem stance for equal or greater than 30 seconds with each foot forwards to demonstrate decreased fall risk.  Baseline: R = 9 seconds; L = 3 seconds (09/15/21); Goal status: In-progress   4.  Patient will score equal or greater than 25/30 on Functional Gait Assessment to demonstrate low fall risk (09/15/2021);  Baseline: to be tested visit 2 as appropriate (09/15/21); 20/30 moderate fall risk (09/22/2021);  Goal status: In-progress   5.  Patient will complete community, work and/or recreational activities  without limitation due to current condition.  Baseline: difficulty with usual activities including sleeping, lifting, tying shoes, getting dressed, keeping balance while upright, prolonged walking on hard surface, walking without a cane, hobbies, climbing ladders (instructed not to), stairs, playing basketball, running, stopping, athletic endeavors (09/15/21); Goal status: In-progress   PLAN: PT FREQUENCY: 1-2x/week   PT DURATION: 12 weeks   PLANNED INTERVENTIONS: Therapeutic exercises, Therapeutic activity, Neuromuscular re-education, Balance training, Gait training, Patient/Family education, Self Care, Joint mobilization, Stair training, DME instructions, Electrical stimulation, Spinal mobilization, Cryotherapy, Moist heat, Manual therapy, and Re-evaluation.   PLAN FOR NEXT SESSION: update HEP as appropriate, balance interventions, LE and functional strengthening and ROM exercises as appropriate.   Nancy Nordmann, PT, DPT 10/04/2021, 3:42 PM  [facility] [address] [place], [place] [address] P: [phone] I F: [phone]

## 2021-10-06 ENCOUNTER — Encounter: Payer: Medicare Other | Admitting: Physical Therapy

## 2021-10-07 ENCOUNTER — Encounter: Payer: Self-pay | Admitting: Physical Therapy

## 2021-10-07 ENCOUNTER — Ambulatory Visit: Payer: Medicare Other | Admitting: Physical Therapy

## 2021-10-07 DIAGNOSIS — M5459 Other low back pain: Secondary | ICD-10-CM

## 2021-10-07 DIAGNOSIS — R209 Unspecified disturbances of skin sensation: Secondary | ICD-10-CM

## 2021-10-07 DIAGNOSIS — R2681 Unsteadiness on feet: Secondary | ICD-10-CM | POA: Diagnosis not present

## 2021-10-07 NOTE — Therapy (Signed)
OUTPATIENT PHYSICAL THERAPY TREATMENT NOTE   Patient Name: Devin Landry MRN: [id number] DOB:[birth year], 81 y.o., male Today's Date: 10/07/2021  PCP: Baxter Hire, MD REFERRING PROVIDER: Meade Maw, MD  END OF SESSION:   PT End of Session - 10/07/21 1725     Visit Number 5    Number of Visits 24    Date for PT Re-Evaluation 12/08/21    Authorization Type [employer] MEDICARE reporting period from 09/15/2021    Progress Note Due on Visit 10    PT Start Time 1725    PT Stop Time 1810    PT Time Calculation (min) 45 min    Equipment Utilized During Treatment Gait belt    Activity Tolerance Patient tolerated treatment well    Behavior During Therapy [facility] for tasks assessed/performed   verbose, requiring redirection to stay on topic at times               Past Medical History:  Diagnosis Date   Allergic state    Arthritis    BPH (benign prostatic hyperplasia)    Bursitis    hips   Cancer ([place])    basal and squamous cell   Cataracts, bilateral    ED (erectile dysfunction)    Elevated PSA    Heart murmur    born with mumur-asymptomatic   Hemorrhoids    History of chicken pox    History of colon polyps    History of kidney stones    Hypercholesteremia    Hypogonadism in male    Nephrolithiasis    PIN III (prostatic intraepithelial neoplasia III)    Spinal stenosis of lumbar region    Umbilical hernia    Past Surgical History:  Procedure Laterality Date   ANKLE FRACTURE SURGERY Left 10/10/2007   CARPAL TUNNEL RELEASE Right 2007   CATARACT EXTRACTION, BILATERAL Bilateral    COLONOSCOPY     1993, 1996, 2000, 2005,2010, 2015, 2018   COLONOSCOPY WITH PROPOFOL N/A 09/21/2016   Procedure: COLONOSCOPY WITH PROPOFOL;  Surgeon: Manya Silvas, MD;  Location: [facility] ENDOSCOPY;  Service: Endoscopy;  Laterality: N/A;   CYSTOSCOPY W/ RETROGRADES  04/13/2021   Procedure: CYSTOSCOPY WITH RETROGRADE PYELOGRAM;  Surgeon: Abbie Sons, MD;  Location: [facility] ORS;   Service: Urology;;   CYSTOSCOPY WITH INSERTION OF UROLIFT N/A 07/27/2021   Procedure: CYSTOSCOPY WITH INSERTION OF UROLIFT;  Surgeon: Abbie Sons, MD;  Location: [facility] ORS;  Service: Urology;  Laterality: N/A;   CYSTOSCOPY/URETEROSCOPY/HOLMIUM LASER/STENT PLACEMENT Right 04/13/2021   Procedure: CYSTOSCOPY/URETEROSCOPY/HOLMIUM LASER/STENT PLACEMENT;  Surgeon: Abbie Sons, MD;  Location: [facility] ORS;  Service: Urology;  Laterality: Right;   FEMUR FRACTURE SURGERY Left 10/10/2007   FRACTURE SURGERY Left 10/10/2007   ORIF DISTAL RADIUS FRACTURE AND PERTROCHANTERIC FEMUR FRACTURE   HERNIA REPAIR Bilateral 1964   KNEE SURGERY Left 1984   torn meniscus   KNEE SURGERY Right 2005   torn meniscus   LUMBAR LAMINECTOMY/DECOMPRESSION MICRODISCECTOMY N/A 07/02/2021   Procedure: L2-S1 DECOMPRESSION;  Surgeon: Meade Maw, MD;  Location: [facility] ORS;  Service: Neurosurgery;  Laterality: N/A;   Patient Active Problem List   Diagnosis Date Noted   Status post lumbar spine operation 07/02/2021   Colon polyp 09/19/2018   Other chronic pain 07/24/2017   Pain in joint of right shoulder 05/23/2017   Hx of adenomatous colonic polyps 06/20/2016   Asthma without status asthmaticus 04/23/2014   High cholesterol 04/23/2014   Benign prostatic hyperplasia with lower urinary tract symptoms 07/15/2013  Benign neoplasm of colon 07/15/2013   ED (erectile dysfunction) of organic origin 12/01/2011   History of elevated PSA 12/01/2011   Nephrolithiasis 12/01/2011   PIN III (prostatic intraepithelial neoplasm III) 12/01/2011    REFERRING DIAG: Imbalance  THERAPY DIAG:  Unsteadiness on feet  Other low back pain  Unspecified disturbances of skin sensation  Rationale for Evaluation and Treatment Rehabilitation  PERTINENT HISTORY: Patient is a 81 y.o. male who presents to outpatient physical therapy with a referral for medical diagnosis imbalance. This patient's chief complaints consist of paresthesia in  L > R LE that affects his balance and ambulation leading to the following functional deficits: difficulty with usual activities including sleeping, lifting, tying shoes, getting dressed, keeping balance while upright, prolonged walking on hard surface, walking without a cane, hobbies, climbing ladders (instructed not to), stairs, playing basketball, running, stopping, athletic endeavors. Relevant past medical history and comorbidities include arthritis, BPH, bursitis of hips, basal and squamous cell cancer, bilateral cataracts, heart murmur (asymptomatic), hemorrhoids, history of kidney stones, history of colon polyps, hypercholesteremia, Prostatic intraepithelial neoplasia III, spinal stenosis of lumbar region, umbilical hernia, L ankle fracture surgery (2009), right carpal tunnel release (2007), cataract extraction bilateral, cytoscopy with insertion of urolift (07/2021), ORIF DISTAL RADIUS FRACTURE AND PERTROCHANTERIC FEMUR FRACTURE (2009), hernia repair (1964), L knee surgery (torn meniscus 1984), R knee surgery (2005, torn meniscus), L2-S1 lumbar decompression including central laminectomy and bilateral medial facetectomies including foraminotomies on 07/02/2021, right shoulder pain.      PRECAUTIONS: lifted  SUBJECTIVE: Patient states he is feeling well today. He go started early today and did water walking this morning. He is having a hard time with the step-standing pallof press due to not having at suitable surface to put his foot on. He states the first exercises are still pretty difficulty. He arrives with [facility].   PAIN:  Are you having pain? He reports no pain   OBJECTIVE  SELF-REPORTED FUNCTION FOTO score: 55/100 (balance questionnaire)   TODAY'S TREATMENT  Neuromuscular Re-education: to improve, balance, postural strength, muscle activation patterns, and stabilization strength required for functional activities: - lateral walking on 5-foot airex beam, 2x5 each direction, no UE support,   CGA.  - step standing pallof press with one foot on 8.5 inch stool, 2x10 each foot front on each side. CGA. (Challenging, R knee gave way after last set standing on R knee).  - forwards/backwards step over 6-inch hurdle while wearing 3#AW each foot, 1x10 with each foot leading, intermittent UE support,  CGA.   Therapeutic exercise: to centralize symptoms and improve ROM, strength, muscular endurance, and activity tolerance required for successful completion of functional activities.  - standing TRX rows, 2x10, SBA and cuing to keep hips forwards.  - sit <> stand holding ball with ball slam/retrival between each sit, from 17 inch (grey) chair, 2x10 with 3kg med ball. (Cuing for moderately fast speed and full stand each time).  - modified mountain climbers with B UE on TM bar and feet approx 3 feet back from bar, 3x10 with 4# AW.   Pt required multimodal cuing for proper technique and to facilitate improved neuromuscular control, strength, range of motion, and functional ability resulting in improved performance and form.   PATIENT EDUCATION:  Education details:  Exercise purpose/form. Self management techniques.  Person educated: Patient Education method: Explanation and Demonstration, verbal and tactile cuing.  Education comprehension: verbalized understanding, returned demonstration and needs further education     HOME EXERCISE PROGRAM: Access Code: [email or web address] URL: [email or web address]  Date: 10/04/2021 Prepared by: Rosita Kea  Exercises - Tandem Stance in Corner  - 1 x daily - 3 reps - 1 min hold - Corner Balance Feet Together With Eyes Closed  - 1 x daily - 3 reps - 1 min hold - Half Tandem Stance Balance with Head Rotation  - 1 x daily - 2-3 sets - 20 reps - [employer] on Counter  - 1 x daily - 3 sets - 10 reps - 5 seconds hold - Single-Leg Anti-Rotation Press With Anchored Resistance  - 3 x weekly - 1-2 sets - 10 reps   ASSESSMENT:   CLINICAL  IMPRESSION: Patient tolerated treatment well overall with mild limitation due to R knee pain and instability. He was able to progress functional LE and core strengthening and balance exercises slightly today. Patient very enthusiastic about continuing HEP. He needs guarding in clinic for safety due to loss of balance and R knee instability. His posture is quite flexed at the hips and lumbar spine, which may contribute to his balance difficulty.  Plan to continue working on similar exercises, progressed as appropriate next session. Patient would benefit from continued management of limiting condition by skilled physical therapist to address remaining impairments and functional limitations to work towards stated goals and return to PLOF or maximal functional independence.   From PT eval on 09/15/2021: Patient is a 81 y.o. male referred to outpatient physical therapy with a medical diagnosis of imbalance who presents with signs and symptoms consistent with unsteadiness on feet and difficulty walking s/p L2-S1 lumbar decompression including central laminectomy and bilateral medial facetectomies including foraminotomies on 07/02/2021. Patient has loss of sensation on L LE contributing to unsteadiness as well as altered back, hip, and knee posture and range of motion. Patient presents with significant pain, paresthesia, sensation, ROM, posture, joint stiffness, balance, gait, muscle performance (strength/power/endurance), activity tolerance, motor control, and proprioception impairments that are limiting ability to complete his usual activities including sleeping, lifting, tying shoes, getting dressed, keeping balance while upright, prolonged walking on hard surface, walking without a cane, hobbies, climbing ladders (instructed not to), stairs, playing basketball, running, stopping, athletic endeavors without difficulty. Patient will benefit from skilled physical therapy intervention to address current body structure  impairments and activity limitations to improve function and work towards goals set in current POC in order to return to prior level of function or maximal functional improvement.    OBJECTIVE IMPAIRMENTS Abnormal gait, decreased activity tolerance, decreased balance, decreased endurance, decreased knowledge of condition, decreased knowledge of use of DME, decreased mobility, difficulty walking, decreased ROM, decreased strength, hypomobility, increased fascial restrictions, impaired perceived functional ability, increased muscle spasms, impaired flexibility, impaired sensation, improper body mechanics, postural dysfunction, and pain.    ACTIVITY LIMITATIONS carrying, lifting, bending, standing, squatting, sleeping, stairs, transfers, bed mobility, dressing, and locomotion level   PARTICIPATION LIMITATIONS: interpersonal relationship, driving, community activity, yard work, and   usual activities including sleeping, lifting, tying shoes, getting dressed, keeping balance while upright, prolonged walking on hard surface, walking without a cane, hobbies, climbing ladders (instructed not to), stairs, playing basketball, running, stopping, athletic endeavors   PERSONAL FACTORS Age, Behavior pattern, Past/current experiences, Profession, Time since onset of injury/illness/exacerbation, and 3+ comorbidities:   arthritis, BPH, bursitis of hips, basal and squamous cell cancer, bilateral cataracts, heart murmur (asymptomatic), hemorrhoids, history of kidney stones, history of colon polyps, hypercholesteremia, Prostatic intraepithelial neoplasia III, spinal stenosis of lumbar region, umbilical hernia, L ankle fracture surgery (2009), right carpal tunnel release (2007),  cataract extraction bilateral, cytoscopy with insertion of urolift (07/2021), ORIF DISTAL RADIUS FRACTURE AND PERTROCHANTERIC FEMUR FRACTURE (2009), hernia repair (1964), L knee surgery (torn meniscus 1984), R knee surgery (2005, torn meniscus), L2-S1  lumbar decompression including central laminectomy and bilateral medial facetectomies including foraminotomies on 07/02/2021, right shoulder pain are also affecting patient's functional outcome.    REHAB POTENTIAL: Good   CLINICAL DECISION MAKING: Stable/uncomplicated   EVALUATION COMPLEXITY: Low     GOALS: Goals reviewed with patient? No   SHORT TERM GOALS: Target date: 09/29/2021   Patient will be independent with initial home exercise program for self-management of symptoms. Baseline: Initial HEP to be provided at visit 2 as appropriate (09/15/21); initial HEP provided visit 2 (09/22/2021);  Goal status: In-progress     LONG TERM GOALS: Target date: 12/08/2021   Patient will be independent with a long-term home exercise program for self-management of symptoms.  Baseline: Initial HEP to be provided at visit 2 as appropriate (09/15/21); initial HEP provided visit 2 (09/22/2021);  Goal status: In-progress   2.  Patient will demonstrate improved FOTO to equal or greater than 60 by visit #13 to demonstrate improvement in overall condition and self-reported functional ability.  Baseline: 56 (09/15/21); 55 at visit #5 (10/07/2021);  Goal status: In-progress   3.  Patient will demonstrate the ability to stand in tandem stance for equal or greater than 30 seconds with each foot forwards to demonstrate decreased fall risk.  Baseline: R = 9 seconds; L = 3 seconds (09/15/21); Goal status: In-progress   4.  Patient will score equal or greater than 25/30 on Functional Gait Assessment to demonstrate low fall risk (09/15/2021);  Baseline: to be tested visit 2 as appropriate (09/15/21); 20/30 moderate fall risk (09/22/2021);  Goal status: In-progress   5.  Patient will complete community, work and/or recreational activities without limitation due to current condition.  Baseline: difficulty with usual activities including sleeping, lifting, tying shoes, getting dressed, keeping balance while upright,  prolonged walking on hard surface, walking without a cane, hobbies, climbing ladders (instructed not to), stairs, playing basketball, running, stopping, athletic endeavors (09/15/21); Goal status: In-progress   PLAN: PT FREQUENCY: 1-2x/week   PT DURATION: 12 weeks   PLANNED INTERVENTIONS: Therapeutic exercises, Therapeutic activity, Neuromuscular re-education, Balance training, Gait training, Patient/Family education, Self Care, Joint mobilization, Stair training, DME instructions, Electrical stimulation, Spinal mobilization, Cryotherapy, Moist heat, Manual therapy, and Re-evaluation.   PLAN FOR NEXT SESSION: update HEP as appropriate, balance interventions, LE and functional strengthening and ROM exercises as appropriate.   Nancy Nordmann, PT, DPT 10/07/2021, 7:41 PM  [facility] [address] [place], [place] [address] P: [phone] I F: [phone]

## 2021-10-12 ENCOUNTER — Encounter: Payer: Self-pay | Admitting: Physical Therapy

## 2021-10-12 ENCOUNTER — Ambulatory Visit: Payer: Medicare Other | Attending: Neurosurgery

## 2021-10-12 DIAGNOSIS — R2681 Unsteadiness on feet: Secondary | ICD-10-CM | POA: Insufficient documentation

## 2021-10-12 DIAGNOSIS — M5459 Other low back pain: Secondary | ICD-10-CM | POA: Insufficient documentation

## 2021-10-12 DIAGNOSIS — R209 Unspecified disturbances of skin sensation: Secondary | ICD-10-CM | POA: Diagnosis present

## 2021-10-12 NOTE — Therapy (Signed)
OUTPATIENT PHYSICAL THERAPY TREATMENT NOTE   Patient Name: Devin Landry MRN: [id number] DOB:[birth year], 81 y.o., male Today's Date: 10/12/2021  PCP: Baxter Hire, MD REFERRING PROVIDER: Meade Maw, MD  END OF SESSION:   PT End of Session - 10/12/21 1349     Visit Number 6    Number of Visits 24    Date for PT Re-Evaluation 12/08/21    Authorization Type [employer] MEDICARE reporting period from 09/15/2021    Progress Note Due on Visit 10    PT Start Time 1348    PT Stop Time 1430    PT Time Calculation (min) 42 min    Equipment Utilized During Treatment Gait belt    Activity Tolerance Patient tolerated treatment well    Behavior During Therapy [facility] for tasks assessed/performed   verbose, requiring redirection to stay on topic at times               Past Medical History:  Diagnosis Date   Allergic state    Arthritis    BPH (benign prostatic hyperplasia)    Bursitis    hips   Cancer ([place])    basal and squamous cell   Cataracts, bilateral    ED (erectile dysfunction)    Elevated PSA    Heart murmur    born with mumur-asymptomatic   Hemorrhoids    History of chicken pox    History of colon polyps    History of kidney stones    Hypercholesteremia    Hypogonadism in male    Nephrolithiasis    PIN III (prostatic intraepithelial neoplasia III)    Spinal stenosis of lumbar region    Umbilical hernia    Past Surgical History:  Procedure Laterality Date   ANKLE FRACTURE SURGERY Left 10/10/2007   CARPAL TUNNEL RELEASE Right 2007   CATARACT EXTRACTION, BILATERAL Bilateral    COLONOSCOPY     1993, 1996, 2000, 2005,2010, 2015, 2018   COLONOSCOPY WITH PROPOFOL N/A 09/21/2016   Procedure: COLONOSCOPY WITH PROPOFOL;  Surgeon: Manya Silvas, MD;  Location: [facility] ENDOSCOPY;  Service: Endoscopy;  Laterality: N/A;   CYSTOSCOPY W/ RETROGRADES  04/13/2021   Procedure: CYSTOSCOPY WITH RETROGRADE PYELOGRAM;  Surgeon: Abbie Sons, MD;  Location: [facility] ORS;   Service: Urology;;   CYSTOSCOPY WITH INSERTION OF UROLIFT N/A 07/27/2021   Procedure: CYSTOSCOPY WITH INSERTION OF UROLIFT;  Surgeon: Abbie Sons, MD;  Location: [facility] ORS;  Service: Urology;  Laterality: N/A;   CYSTOSCOPY/URETEROSCOPY/HOLMIUM LASER/STENT PLACEMENT Right 04/13/2021   Procedure: CYSTOSCOPY/URETEROSCOPY/HOLMIUM LASER/STENT PLACEMENT;  Surgeon: Abbie Sons, MD;  Location: [facility] ORS;  Service: Urology;  Laterality: Right;   FEMUR FRACTURE SURGERY Left 10/10/2007   FRACTURE SURGERY Left 10/10/2007   ORIF DISTAL RADIUS FRACTURE AND PERTROCHANTERIC FEMUR FRACTURE   HERNIA REPAIR Bilateral 1964   KNEE SURGERY Left 1984   torn meniscus   KNEE SURGERY Right 2005   torn meniscus   LUMBAR LAMINECTOMY/DECOMPRESSION MICRODISCECTOMY N/A 07/02/2021   Procedure: L2-S1 DECOMPRESSION;  Surgeon: Meade Maw, MD;  Location: [facility] ORS;  Service: Neurosurgery;  Laterality: N/A;   Patient Active Problem List   Diagnosis Date Noted   Status post lumbar spine operation 07/02/2021   Colon polyp 09/19/2018   Other chronic pain 07/24/2017   Pain in joint of right shoulder 05/23/2017   Hx of adenomatous colonic polyps 06/20/2016   Asthma without status asthmaticus 04/23/2014   High cholesterol 04/23/2014   Benign prostatic hyperplasia with lower urinary tract symptoms 07/15/2013  Benign neoplasm of colon 07/15/2013   ED (erectile dysfunction) of organic origin 12/01/2011   History of elevated PSA 12/01/2011   Nephrolithiasis 12/01/2011   PIN III (prostatic intraepithelial neoplasm III) 12/01/2011    REFERRING DIAG: Imbalance  THERAPY DIAG:  Unsteadiness on feet  Other low back pain  Rationale for Evaluation and Treatment Rehabilitation  PERTINENT HISTORY: Patient is a 81 y.o. male who presents to outpatient physical therapy with a referral for medical diagnosis imbalance. This patient's chief complaints consist of paresthesia in L > R LE that affects his balance and  ambulation leading to the following functional deficits: difficulty with usual activities including sleeping, lifting, tying shoes, getting dressed, keeping balance while upright, prolonged walking on hard surface, walking without a cane, hobbies, climbing ladders (instructed not to), stairs, playing basketball, running, stopping, athletic endeavors. Relevant past medical history and comorbidities include arthritis, BPH, bursitis of hips, basal and squamous cell cancer, bilateral cataracts, heart murmur (asymptomatic), hemorrhoids, history of kidney stones, history of colon polyps, hypercholesteremia, Prostatic intraepithelial neoplasia III, spinal stenosis of lumbar region, umbilical hernia, L ankle fracture surgery (2009), right carpal tunnel release (2007), cataract extraction bilateral, cytoscopy with insertion of urolift (07/2021), ORIF DISTAL RADIUS FRACTURE AND PERTROCHANTERIC FEMUR FRACTURE (2009), hernia repair (1964), L knee surgery (torn meniscus 1984), R knee surgery (2005, torn meniscus), L2-S1 lumbar decompression including central laminectomy and bilateral medial facetectomies including foraminotomies on 07/02/2021, right shoulder pain.      PRECAUTIONS: lifted  SUBJECTIVE: Patient reports maintaining activity levels with water walking and HEP. Been less compliant with HEP due to being busy. No falls reported.    PAIN:  Are you having pain? He reports no pain   OBJECTIVE  SELF-REPORTED FUNCTION FOTO score: 55/100 (balance questionnaire)   TODAY'S TREATMENT  Neuromuscular Re-education: to improve, balance, postural strength, muscle activation patterns, and stabilization strength required for functional activities:  - lateral walking on 5-foot airex beam, x10 each direction, no UE support,  CGA.   - tandem walking on airex beam, intermittent SUE support, CGA. X4 laps forwards only.   - step standing pallof press with one foot on 8.5 inch stool, 2x10 each foot front on each side.  CGA. (Challenging, R knee gave way after last set standing on R knee). Frequent imbalance with LLE on step with difficulty with core stability and eccentric control on band.    Therapeutic exercise: to centralize symptoms and improve ROM, strength, muscular endurance, and activity tolerance required for successful completion of functional activities.   - standing TRX rows, 2x10, SBA and cuing to keep hips forwards.   - sit <> stand holding ball with ball slam/retrival between each sit, from 17 inch (grey) chair, 2x10 with 3kg med ball. (Cuing for moderately fast speed and full stand each time).   - modified mountain climbers with B UE on TM bar and feet approx 3 feet back from bar, 3x12 with 4# AW.   Pt required multimodal cuing for proper technique and to facilitate improved neuromuscular control, strength, range of motion, and functional ability resulting in improved performance and form.   PATIENT EDUCATION:  Education details:  Exercise purpose/form. Self management techniques.  Person educated: Patient Education method: Explanation and Demonstration, verbal and tactile cuing.  Education comprehension: verbalized understanding, returned demonstration and needs further education     HOME EXERCISE PROGRAM: Access Code: [email or web address] URL: [email or web address] Date: 10/04/2021 Prepared by: Rosita Kea  Exercises - Tandem Stance in Corner  - 1 x daily - 3 reps -  1 min hold - Paramedic Together With Eyes Closed  - 1 x daily - 3 reps - 1 min hold - Half Tandem Stance Balance with Head Rotation  - 1 x daily - 2-3 sets - 20 reps - [employer] on Counter  - 1 x daily - 3 sets - 10 reps - 5 seconds hold - Single-Leg Anti-Rotation Press With Anchored Resistance  - 3 x weekly - 1-2 sets - 10 reps   ASSESSMENT:   CLINICAL IMPRESSION: Continuing PT POC with focus on LE strengthening and balance. Pt does rely on close CGA with balance exercises due to weakness in  quad musculature and imbalance. Pt understanding of exercises thus far only reliant on min VC's to limit UE support throughout. Pt will continue to benefit from skilled PT services to address balance and strength impairments to optimize return to functional mobility and reduced risk of falls.    From PT eval on 09/15/2021: Patient is a 81 y.o. male referred to outpatient physical therapy with a medical diagnosis of imbalance who presents with signs and symptoms consistent with unsteadiness on feet and difficulty walking s/p L2-S1 lumbar decompression including central laminectomy and bilateral medial facetectomies including foraminotomies on 07/02/2021. Patient has loss of sensation on L LE contributing to unsteadiness as well as altered back, hip, and knee posture and range of motion. Patient presents with significant pain, paresthesia, sensation, ROM, posture, joint stiffness, balance, gait, muscle performance (strength/power/endurance), activity tolerance, motor control, and proprioception impairments that are limiting ability to complete his usual activities including sleeping, lifting, tying shoes, getting dressed, keeping balance while upright, prolonged walking on hard surface, walking without a cane, hobbies, climbing ladders (instructed not to), stairs, playing basketball, running, stopping, athletic endeavors without difficulty. Patient will benefit from skilled physical therapy intervention to address current body structure impairments and activity limitations to improve function and work towards goals set in current POC in order to return to prior level of function or maximal functional improvement.    OBJECTIVE IMPAIRMENTS Abnormal gait, decreased activity tolerance, decreased balance, decreased endurance, decreased knowledge of condition, decreased knowledge of use of DME, decreased mobility, difficulty walking, decreased ROM, decreased strength, hypomobility, increased fascial restrictions,  impaired perceived functional ability, increased muscle spasms, impaired flexibility, impaired sensation, improper body mechanics, postural dysfunction, and pain.    ACTIVITY LIMITATIONS carrying, lifting, bending, standing, squatting, sleeping, stairs, transfers, bed mobility, dressing, and locomotion level   PARTICIPATION LIMITATIONS: interpersonal relationship, driving, community activity, yard work, and   usual activities including sleeping, lifting, tying shoes, getting dressed, keeping balance while upright, prolonged walking on hard surface, walking without a cane, hobbies, climbing ladders (instructed not to), stairs, playing basketball, running, stopping, athletic endeavors   PERSONAL FACTORS Age, Behavior pattern, Past/current experiences, Profession, Time since onset of injury/illness/exacerbation, and 3+ comorbidities:   arthritis, BPH, bursitis of hips, basal and squamous cell cancer, bilateral cataracts, heart murmur (asymptomatic), hemorrhoids, history of kidney stones, history of colon polyps, hypercholesteremia, Prostatic intraepithelial neoplasia III, spinal stenosis of lumbar region, umbilical hernia, L ankle fracture surgery (2009), right carpal tunnel release (2007), cataract extraction bilateral, cytoscopy with insertion of urolift (07/2021), ORIF DISTAL RADIUS FRACTURE AND PERTROCHANTERIC FEMUR FRACTURE (2009), hernia repair (1964), L knee surgery (torn meniscus 1984), R knee surgery (2005, torn meniscus), L2-S1 lumbar decompression including central laminectomy and bilateral medial facetectomies including foraminotomies on 07/02/2021, right shoulder pain are also affecting patient's functional outcome.    REHAB POTENTIAL: Good   CLINICAL DECISION MAKING: Stable/uncomplicated  EVALUATION COMPLEXITY: Low     GOALS: Goals reviewed with patient? No   SHORT TERM GOALS: Target date: 09/29/2021   Patient will be independent with initial home exercise program for self-management of  symptoms. Baseline: Initial HEP to be provided at visit 2 as appropriate (09/15/21); initial HEP provided visit 2 (09/22/2021);  Goal status: In-progress     LONG TERM GOALS: Target date: 12/08/2021   Patient will be independent with a long-term home exercise program for self-management of symptoms.  Baseline: Initial HEP to be provided at visit 2 as appropriate (09/15/21); initial HEP provided visit 2 (09/22/2021);  Goal status: In-progress   2.  Patient will demonstrate improved FOTO to equal or greater than 60 by visit #13 to demonstrate improvement in overall condition and self-reported functional ability.  Baseline: 56 (09/15/21); 55 at visit #5 (10/07/2021);  Goal status: In-progress   3.  Patient will demonstrate the ability to stand in tandem stance for equal or greater than 30 seconds with each foot forwards to demonstrate decreased fall risk.  Baseline: R = 9 seconds; L = 3 seconds (09/15/21); Goal status: In-progress   4.  Patient will score equal or greater than 25/30 on Functional Gait Assessment to demonstrate low fall risk (09/15/2021);  Baseline: to be tested visit 2 as appropriate (09/15/21); 20/30 moderate fall risk (09/22/2021);  Goal status: In-progress   5.  Patient will complete community, work and/or recreational activities without limitation due to current condition.  Baseline: difficulty with usual activities including sleeping, lifting, tying shoes, getting dressed, keeping balance while upright, prolonged walking on hard surface, walking without a cane, hobbies, climbing ladders (instructed not to), stairs, playing basketball, running, stopping, athletic endeavors (09/15/21); Goal status: In-progress   PLAN: PT FREQUENCY: 1-2x/week   PT DURATION: 12 weeks   PLANNED INTERVENTIONS: Therapeutic exercises, Therapeutic activity, Neuromuscular re-education, Balance training, Gait training, Patient/Family education, Self Care, Joint mobilization, Stair training, DME  instructions, Electrical stimulation, Spinal mobilization, Cryotherapy, Moist heat, Manual therapy, and Re-evaluation.   PLAN FOR NEXT SESSION: update HEP as appropriate, balance interventions, LE and functional strengthening and ROM exercises as appropriate.   Salem Caster. Fairly IV, PT, DPT Physical Therapist- [facility]  10/12/2021, 3:19 PM

## 2021-10-14 ENCOUNTER — Encounter: Payer: Self-pay | Admitting: Physical Therapy

## 2021-10-14 ENCOUNTER — Ambulatory Visit: Payer: Medicare Other

## 2021-10-14 DIAGNOSIS — R2681 Unsteadiness on feet: Secondary | ICD-10-CM | POA: Diagnosis not present

## 2021-10-14 DIAGNOSIS — R209 Unspecified disturbances of skin sensation: Secondary | ICD-10-CM

## 2021-10-14 DIAGNOSIS — M5459 Other low back pain: Secondary | ICD-10-CM

## 2021-10-14 NOTE — Therapy (Signed)
OUTPATIENT PHYSICAL THERAPY TREATMENT NOTE   Patient Name: Devin Landry MRN: [id number] DOB:1940/04/11, 81 y.o., male Today's Date: 10/14/2021  PCP: Baxter Hire, MD REFERRING PROVIDER: Meade Maw, MD  END OF SESSION:   PT End of Session - 10/14/21 1257     Visit Number 7    Number of Visits 24    Date for PT Re-Evaluation 12/08/21    Authorization Type [employer] MEDICARE reporting period from 09/15/2021    Progress Note Due on Visit 10    PT Start Time 1300    PT Stop Time 1344    PT Time Calculation (min) 44 min    Equipment Utilized During Treatment Gait belt    Activity Tolerance Patient tolerated treatment well    Behavior During Therapy WFL for tasks assessed/performed   verbose, requiring redirection to stay on topic at times               Past Medical History:  Diagnosis Date   Allergic state    Arthritis    BPH (benign prostatic hyperplasia)    Bursitis    hips   Cancer ([place])    basal and squamous cell   Cataracts, bilateral    ED (erectile dysfunction)    Elevated PSA    Heart murmur    born with mumur-asymptomatic   Hemorrhoids    History of chicken pox    History of colon polyps    History of kidney stones    Hypercholesteremia    Hypogonadism in male    Nephrolithiasis    PIN III (prostatic intraepithelial neoplasia III)    Spinal stenosis of lumbar region    Umbilical hernia    Past Surgical History:  Procedure Laterality Date   ANKLE FRACTURE SURGERY Left 10/10/2007   CARPAL TUNNEL RELEASE Right 2007   CATARACT EXTRACTION, BILATERAL Bilateral    COLONOSCOPY     1993, 1996, 2000, 2005,2010, 2015, 2018   COLONOSCOPY WITH PROPOFOL N/A 09/21/2016   Procedure: COLONOSCOPY WITH PROPOFOL;  Surgeon: Manya Silvas, MD;  Location: [facility] ENDOSCOPY;  Service: Endoscopy;  Laterality: N/A;   CYSTOSCOPY W/ RETROGRADES  04/13/2021   Procedure: CYSTOSCOPY WITH RETROGRADE PYELOGRAM;  Surgeon: Abbie Sons, MD;  Location: [facility] ORS;   Service: Urology;;   CYSTOSCOPY WITH INSERTION OF UROLIFT N/A 07/27/2021   Procedure: CYSTOSCOPY WITH INSERTION OF UROLIFT;  Surgeon: Abbie Sons, MD;  Location: [facility] ORS;  Service: Urology;  Laterality: N/A;   CYSTOSCOPY/URETEROSCOPY/HOLMIUM LASER/STENT PLACEMENT Right 04/13/2021   Procedure: CYSTOSCOPY/URETEROSCOPY/HOLMIUM LASER/STENT PLACEMENT;  Surgeon: Abbie Sons, MD;  Location: [facility] ORS;  Service: Urology;  Laterality: Right;   FEMUR FRACTURE SURGERY Left 10/10/2007   FRACTURE SURGERY Left 10/10/2007   ORIF DISTAL RADIUS FRACTURE AND PERTROCHANTERIC FEMUR FRACTURE   HERNIA REPAIR Bilateral 1964   KNEE SURGERY Left 1984   torn meniscus   KNEE SURGERY Right 2005   torn meniscus   LUMBAR LAMINECTOMY/DECOMPRESSION MICRODISCECTOMY N/A 07/02/2021   Procedure: L2-S1 DECOMPRESSION;  Surgeon: Meade Maw, MD;  Location: [facility] ORS;  Service: Neurosurgery;  Laterality: N/A;   Patient Active Problem List   Diagnosis Date Noted   Status post lumbar spine operation 07/02/2021   Colon polyp 09/19/2018   Other chronic pain 07/24/2017   Pain in joint of right shoulder 05/23/2017   Hx of adenomatous colonic polyps 06/20/2016   Asthma without status asthmaticus 04/23/2014   High cholesterol 04/23/2014   Benign prostatic hyperplasia with lower urinary tract symptoms 07/15/2013  Benign neoplasm of colon 07/15/2013   ED (erectile dysfunction) of organic origin 12/01/2011   History of elevated PSA 12/01/2011   Nephrolithiasis 12/01/2011   PIN III (prostatic intraepithelial neoplasm III) 12/01/2011    REFERRING DIAG: Imbalance  THERAPY DIAG:  Unsteadiness on feet  Other low back pain  Unspecified disturbances of skin sensation  Rationale for Evaluation and Treatment Rehabilitation  PERTINENT HISTORY: Patient is a 81 y.o. male who presents to outpatient physical therapy with a referral for medical diagnosis imbalance. This patient's chief complaints consist of paresthesia in  L > R LE that affects his balance and ambulation leading to the following functional deficits: difficulty with usual activities including sleeping, lifting, tying shoes, getting dressed, keeping balance while upright, prolonged walking on hard surface, walking without a cane, hobbies, climbing ladders (instructed not to), stairs, playing basketball, running, stopping, athletic endeavors. Relevant past medical history and comorbidities include arthritis, BPH, bursitis of hips, basal and squamous cell cancer, bilateral cataracts, heart murmur (asymptomatic), hemorrhoids, history of kidney stones, history of colon polyps, hypercholesteremia, Prostatic intraepithelial neoplasia III, spinal stenosis of lumbar region, umbilical hernia, L ankle fracture surgery (2009), right carpal tunnel release (2007), cataract extraction bilateral, cytoscopy with insertion of urolift (07/2021), ORIF DISTAL RADIUS FRACTURE AND PERTROCHANTERIC FEMUR FRACTURE (2009), hernia repair (1964), L knee surgery (torn meniscus 1984), R knee surgery (2005, torn meniscus), L2-S1 lumbar decompression including central laminectomy and bilateral medial facetectomies including foraminotomies on 07/02/2021, right shoulder pain.      PRECAUTIONS: lifted  SUBJECTIVE: Pt reports consistent HEP compliance. Has more equipment for HEP. No falls or LOB.  PAIN:  Are you having pain? He reports no pain   OBJECTIVE  SELF-REPORTED FUNCTION FOTO score: 55/100 (balance questionnaire)   TODAY'S TREATMENT  Neuromuscular Re-education: to improve, balance, postural strength, muscle activation patterns, and stabilization strength required for functional activities:  - lateral walking on 5-foot airex beam, x10 each direction, no UE support,  CGA.   - step standing pallof press with one foot on 8.5 inch stool, 2x10 each foot front on each side. CGA. (Challenging, R knee gave way after last set standing on R knee). Frequent imbalance with LLE on step with  difficulty with core stability and eccentric control on band.    Therapeutic exercise: to centralize symptoms and improve ROM, strength, muscular endurance, and activity tolerance required for successful completion of functional activities.   - standing TRX rows, 2x10, SBA and cuing to keep hips forwards.   - 6" alternating step ups forwards: Leading with LLE, no UE support. RUE support with leading RUE. VC's to rely on LLE when stepping off both directions due to poor eccentric control on RLE. 2x8/LE.   - sit <> stand holding ball with ball slam/retrival between each sit, from 17 inch (grey) chair, 2x10 with 3kg med ball. (Cuing for moderately fast speed and full stand each time).     Intermittent seated rest b/t exercises due to LE fatigue.     Pt required multimodal cuing for proper technique and to facilitate improved neuromuscular control, strength, range of motion, and functional ability resulting in improved performance and form.   PATIENT EDUCATION:  Education details:  Exercise purpose/form. Self management techniques.  Person educated: Patient Education method: Explanation and Demonstration, verbal and tactile cuing.  Education comprehension: verbalized understanding, returned demonstration and needs further education     HOME EXERCISE PROGRAM: Access Code: [email or web address] URL: [email or web address] Date: 10/04/2021 Prepared by: Rosita Kea  Exercises - Tandem Stance in Corner  -  1 x daily - 3 reps - 1 min hold - Therapist, sports Feet Together With Eyes Closed  - 1 x daily - 3 reps - 1 min hold - Half Tandem Stance Balance with Head Rotation  - 1 x daily - 2-3 sets - 20 reps - [employer] on Counter  - 1 x daily - 3 sets - 10 reps - 5 seconds hold - Single-Leg Anti-Rotation Press With Anchored Resistance  - 3 x weekly - 1-2 sets - 10 reps   ASSESSMENT:   CLINICAL IMPRESSION: Continuing PT POC with focus on functional strengthening and balance this date.  Pt remains with significantly weak R knee extensors especially with eccentric control affecting balance requiring PT minA and UE support to correct. Encouraged pt continuing HEP to progress strength and balance training. Pt will continue to benefit from skilled PT services to address balance and strength impairments to optimize return to functional mobility and reduced risk of falls.    From PT eval on 09/15/2021: Patient is a 81 y.o. male referred to outpatient physical therapy with a medical diagnosis of imbalance who presents with signs and symptoms consistent with unsteadiness on feet and difficulty walking s/p L2-S1 lumbar decompression including central laminectomy and bilateral medial facetectomies including foraminotomies on 07/02/2021. Patient has loss of sensation on L LE contributing to unsteadiness as well as altered back, hip, and knee posture and range of motion. Patient presents with significant pain, paresthesia, sensation, ROM, posture, joint stiffness, balance, gait, muscle performance (strength/power/endurance), activity tolerance, motor control, and proprioception impairments that are limiting ability to complete his usual activities including sleeping, lifting, tying shoes, getting dressed, keeping balance while upright, prolonged walking on hard surface, walking without a cane, hobbies, climbing ladders (instructed not to), stairs, playing basketball, running, stopping, athletic endeavors without difficulty. Patient will benefit from skilled physical therapy intervention to address current body structure impairments and activity limitations to improve function and work towards goals set in current POC in order to return to prior level of function or maximal functional improvement.    OBJECTIVE IMPAIRMENTS Abnormal gait, decreased activity tolerance, decreased balance, decreased endurance, decreased knowledge of condition, decreased knowledge of use of DME, decreased mobility, difficulty  walking, decreased ROM, decreased strength, hypomobility, increased fascial restrictions, impaired perceived functional ability, increased muscle spasms, impaired flexibility, impaired sensation, improper body mechanics, postural dysfunction, and pain.    ACTIVITY LIMITATIONS carrying, lifting, bending, standing, squatting, sleeping, stairs, transfers, bed mobility, dressing, and locomotion level   PARTICIPATION LIMITATIONS: interpersonal relationship, driving, community activity, yard work, and   usual activities including sleeping, lifting, tying shoes, getting dressed, keeping balance while upright, prolonged walking on hard surface, walking without a cane, hobbies, climbing ladders (instructed not to), stairs, playing basketball, running, stopping, athletic endeavors   PERSONAL FACTORS Age, Behavior pattern, Past/current experiences, Profession, Time since onset of injury/illness/exacerbation, and 3+ comorbidities:   arthritis, BPH, bursitis of hips, basal and squamous cell cancer, bilateral cataracts, heart murmur (asymptomatic), hemorrhoids, history of kidney stones, history of colon polyps, hypercholesteremia, Prostatic intraepithelial neoplasia III, spinal stenosis of lumbar region, umbilical hernia, L ankle fracture surgery (2009), right carpal tunnel release (2007), cataract extraction bilateral, cytoscopy with insertion of urolift (07/2021), ORIF DISTAL RADIUS FRACTURE AND PERTROCHANTERIC FEMUR FRACTURE (2009), hernia repair (1964), L knee surgery (torn meniscus 1984), R knee surgery (2005, torn meniscus), L2-S1 lumbar decompression including central laminectomy and bilateral medial facetectomies including foraminotomies on 07/02/2021, right shoulder pain are also affecting patient's functional outcome.    REHAB  POTENTIAL: Good   CLINICAL DECISION MAKING: Stable/uncomplicated   EVALUATION COMPLEXITY: Low     GOALS: Goals reviewed with patient? No   SHORT TERM GOALS: Target date:  09/29/2021   Patient will be independent with initial home exercise program for self-management of symptoms. Baseline: Initial HEP to be provided at visit 2 as appropriate (09/15/21); initial HEP provided visit 2 (09/22/2021);  Goal status: In-progress     LONG TERM GOALS: Target date: 12/08/2021   Patient will be independent with a long-term home exercise program for self-management of symptoms.  Baseline: Initial HEP to be provided at visit 2 as appropriate (09/15/21); initial HEP provided visit 2 (09/22/2021);  Goal status: In-progress   2.  Patient will demonstrate improved FOTO to equal or greater than 60 by visit #13 to demonstrate improvement in overall condition and self-reported functional ability.  Baseline: 56 (09/15/21); 55 at visit #5 (10/07/2021);  Goal status: In-progress   3.  Patient will demonstrate the ability to stand in tandem stance for equal or greater than 30 seconds with each foot forwards to demonstrate decreased fall risk.  Baseline: R = 9 seconds; L = 3 seconds (09/15/21); Goal status: In-progress   4.  Patient will score equal or greater than 25/30 on Functional Gait Assessment to demonstrate low fall risk (09/15/2021);  Baseline: to be tested visit 2 as appropriate (09/15/21); 20/30 moderate fall risk (09/22/2021);  Goal status: In-progress   5.  Patient will complete community, work and/or recreational activities without limitation due to current condition.  Baseline: difficulty with usual activities including sleeping, lifting, tying shoes, getting dressed, keeping balance while upright, prolonged walking on hard surface, walking without a cane, hobbies, climbing ladders (instructed not to), stairs, playing basketball, running, stopping, athletic endeavors (09/15/21); Goal status: In-progress   PLAN: PT FREQUENCY: 1-2x/week   PT DURATION: 12 weeks   PLANNED INTERVENTIONS: Therapeutic exercises, Therapeutic activity, Neuromuscular re-education, Balance  training, Gait training, Patient/Family education, Self Care, Joint mobilization, Stair training, DME instructions, Electrical stimulation, Spinal mobilization, Cryotherapy, Moist heat, Manual therapy, and Re-evaluation.   PLAN FOR NEXT SESSION: update HEP as appropriate, balance interventions, LE and functional strengthening and ROM exercises as appropriate.   Salem Caster. Fairly IV, PT, DPT Physical Therapist- [facility]  10/14/2021, 2:31 PM

## 2021-10-19 ENCOUNTER — Encounter: Payer: Medicare Other | Admitting: Physical Therapy

## 2021-10-21 ENCOUNTER — Encounter: Payer: Self-pay | Admitting: Physical Therapy

## 2021-10-21 ENCOUNTER — Ambulatory Visit: Payer: Medicare Other | Admitting: Physical Therapy

## 2021-10-21 DIAGNOSIS — R209 Unspecified disturbances of skin sensation: Secondary | ICD-10-CM

## 2021-10-21 DIAGNOSIS — R2681 Unsteadiness on feet: Secondary | ICD-10-CM

## 2021-10-21 DIAGNOSIS — M5459 Other low back pain: Secondary | ICD-10-CM

## 2021-10-21 NOTE — Therapy (Signed)
OUTPATIENT PHYSICAL THERAPY TREATMENT NOTE   Patient Name: Devin Landry MRN: [id number] DOB:13-Dec-1940, 81 y.o., male Today's Date: 10/21/2021  PCP: Baxter Hire, MD REFERRING PROVIDER: Meade Maw, MD  END OF SESSION:   PT End of Session - 10/21/21 2048     Visit Number 8    Number of Visits 24    Date for PT Re-Evaluation 12/08/21    Authorization Type [employer] MEDICARE reporting period from 09/15/2021    Progress Note Due on Visit 10    PT Start Time 1045    PT Stop Time 1120    PT Time Calculation (min) 35 min    Equipment Utilized During Treatment Gait belt    Activity Tolerance Patient tolerated treatment well    Behavior During Therapy WFL for tasks assessed/performed   verbose, requiring redirection to stay on topic at times                Past Medical History:  Diagnosis Date   Allergic state    Arthritis    BPH (benign prostatic hyperplasia)    Bursitis    hips   Cancer ([place])    basal and squamous cell   Cataracts, bilateral    ED (erectile dysfunction)    Elevated PSA    Heart murmur    born with mumur-asymptomatic   Hemorrhoids    History of chicken pox    History of colon polyps    History of kidney stones    Hypercholesteremia    Hypogonadism in male    Nephrolithiasis    PIN III (prostatic intraepithelial neoplasia III)    Spinal stenosis of lumbar region    Umbilical hernia    Past Surgical History:  Procedure Laterality Date   ANKLE FRACTURE SURGERY Left 10/10/2007   CARPAL TUNNEL RELEASE Right 2007   CATARACT EXTRACTION, BILATERAL Bilateral    COLONOSCOPY     1993, 1996, 2000, 2005,2010, 2015, 2018   COLONOSCOPY WITH PROPOFOL N/A 09/21/2016   Procedure: COLONOSCOPY WITH PROPOFOL;  Surgeon: Manya Silvas, MD;  Location: [facility] ENDOSCOPY;  Service: Endoscopy;  Laterality: N/A;   CYSTOSCOPY W/ RETROGRADES  04/13/2021   Procedure: CYSTOSCOPY WITH RETROGRADE PYELOGRAM;  Surgeon: Abbie Sons, MD;  Location: [facility] ORS;   Service: Urology;;   CYSTOSCOPY WITH INSERTION OF UROLIFT N/A 07/27/2021   Procedure: CYSTOSCOPY WITH INSERTION OF UROLIFT;  Surgeon: Abbie Sons, MD;  Location: [facility] ORS;  Service: Urology;  Laterality: N/A;   CYSTOSCOPY/URETEROSCOPY/HOLMIUM LASER/STENT PLACEMENT Right 04/13/2021   Procedure: CYSTOSCOPY/URETEROSCOPY/HOLMIUM LASER/STENT PLACEMENT;  Surgeon: Abbie Sons, MD;  Location: [facility] ORS;  Service: Urology;  Laterality: Right;   FEMUR FRACTURE SURGERY Left 10/10/2007   FRACTURE SURGERY Left 10/10/2007   ORIF DISTAL RADIUS FRACTURE AND PERTROCHANTERIC FEMUR FRACTURE   HERNIA REPAIR Bilateral 1964   KNEE SURGERY Left 1984   torn meniscus   KNEE SURGERY Right 2005   torn meniscus   LUMBAR LAMINECTOMY/DECOMPRESSION MICRODISCECTOMY N/A 07/02/2021   Procedure: L2-S1 DECOMPRESSION;  Surgeon: Meade Maw, MD;  Location: [facility] ORS;  Service: Neurosurgery;  Laterality: N/A;   Patient Active Problem List   Diagnosis Date Noted   Status post lumbar spine operation 07/02/2021   Colon polyp 09/19/2018   Other chronic pain 07/24/2017   Pain in joint of right shoulder 05/23/2017   Hx of adenomatous colonic polyps 06/20/2016   Asthma without status asthmaticus 04/23/2014   High cholesterol 04/23/2014   Benign prostatic hyperplasia with lower urinary tract symptoms 07/15/2013  Benign neoplasm of colon 07/15/2013   ED (erectile dysfunction) of organic origin 12/01/2011   History of elevated PSA 12/01/2011   Nephrolithiasis 12/01/2011   PIN III (prostatic intraepithelial neoplasm III) 12/01/2011    REFERRING DIAG: Imbalance  THERAPY DIAG:  Unsteadiness on feet  Other low back pain  Unspecified disturbances of skin sensation  Rationale for Evaluation and Treatment Rehabilitation  PERTINENT HISTORY: Patient is a 81 y.o. male who presents to outpatient physical therapy with a referral for medical diagnosis imbalance. This patient's chief complaints consist of paresthesia in  L > R LE that affects his balance and ambulation leading to the following functional deficits: difficulty with usual activities including sleeping, lifting, tying shoes, getting dressed, keeping balance while upright, prolonged walking on hard surface, walking without a cane, hobbies, climbing ladders (instructed not to), stairs, playing basketball, running, stopping, athletic endeavors. Relevant past medical history and comorbidities include arthritis, BPH, bursitis of hips, basal and squamous cell cancer, bilateral cataracts, heart murmur (asymptomatic), hemorrhoids, history of kidney stones, history of colon polyps, hypercholesteremia, Prostatic intraepithelial neoplasia III, spinal stenosis of lumbar region, umbilical hernia, L ankle fracture surgery (2009), right carpal tunnel release (2007), cataract extraction bilateral, cytoscopy with insertion of urolift (07/2021), ORIF DISTAL RADIUS FRACTURE AND PERTROCHANTERIC FEMUR FRACTURE (2009), hernia repair (1964), L knee surgery (torn meniscus 1984), R knee surgery (2005, torn meniscus), L2-S1 lumbar decompression including central laminectomy and bilateral medial facetectomies including foraminotomies on 07/02/2021, right shoulder pain.      PRECAUTIONS: lifted  SUBJECTIVE: Patient reports no pain upon arrival. He reports trying to water walk each day. Denies falls since last PT session. States he wants to improve his leg strength to help his balance.   PAIN:  Are you having pain? He reports no pain   OBJECTIVE  TODAY'S TREATMENT   Therapeutic exercise: to centralize symptoms and improve ROM, strength, muscular endurance, and activity tolerance required for successful completion of functional activities.  - seated single leg quad extension, 2x10 each side on OMEGA machine, 2x10 each side at 25#. Much more difficult on the right compared to the left.  - seated single leg hamstring curl, 2x10 each side on OMEGA machine, 2x10 each side at 25#. - step  up to 6 inch step with R LE, 1x2 with R UE support. Discontinued due to poor concentric and eccentric control.  - TOTAL GYM single leg squat, 2x10 each side at level 9.   Neuromuscular Re-education: to improve, balance, postural strength, muscle activation patterns, and stabilization strength required for functional activities: - lateral walking on 5-foot airex beam, 1x6 each direction, no UE support,  CGA.   Pt required multimodal cuing for proper technique and to facilitate improved neuromuscular control, strength, range of motion, and functional ability resulting in improved performance and form.   PATIENT EDUCATION:  Education details:  Exercise purpose/form. Self management techniques.  Person educated: Patient Education method: Explanation and Demonstration, verbal and tactile cuing.  Education comprehension: verbalized understanding, returned demonstration and needs further education     HOME EXERCISE PROGRAM: Access Code: [email or web address] URL: [email or web address] Date: 10/04/2021 Prepared by: Rosita Kea  Exercises - Tandem Stance in Corner  - 1 x daily - 3 reps - 1 min hold - Corner Balance Feet Together With Eyes Closed  - 1 x daily - 3 reps - 1 min hold - Half Tandem Stance Balance with Head Rotation  - 1 x daily - 2-3 sets - 20 reps - [employer] on [employer]  - 1  x daily - 3 sets - 10 reps - 5 seconds hold - Single-Leg Anti-Rotation Press With Anchored Resistance  - 3 x weekly - 1-2 sets - 10 reps   ASSESSMENT:   CLINICAL IMPRESSION: Patient arrives with request to continue working on LE strength to help improve his ability to recover his balance if he becomes unsteady. Focused on quad and LE strengthening, especially of the right knee joint where he demonstrates poor strength during step ups and single leg stance. Patient appeared to tolerate intervention well but did need modifications to accommodate weakness and discomfort in the R > L knee. He also needed  cuing for full range of motion and to decreased compressive pressure on R patella during CKC knee flexion. Plan to continue working on core, functional, and LE strength and balance as appropriate. Patient would benefit from continued management of limiting condition by skilled physical therapist to address remaining impairments and functional limitations to work towards stated goals and return to PLOF or maximal functional independence.   From PT eval on 09/15/2021: Patient is a 81 y.o. male referred to outpatient physical therapy with a medical diagnosis of imbalance who presents with signs and symptoms consistent with unsteadiness on feet and difficulty walking s/p L2-S1 lumbar decompression including central laminectomy and bilateral medial facetectomies including foraminotomies on 07/02/2021. Patient has loss of sensation on L LE contributing to unsteadiness as well as altered back, hip, and knee posture and range of motion. Patient presents with significant pain, paresthesia, sensation, ROM, posture, joint stiffness, balance, gait, muscle performance (strength/power/endurance), activity tolerance, motor control, and proprioception impairments that are limiting ability to complete his usual activities including sleeping, lifting, tying shoes, getting dressed, keeping balance while upright, prolonged walking on hard surface, walking without a cane, hobbies, climbing ladders (instructed not to), stairs, playing basketball, running, stopping, athletic endeavors without difficulty. Patient will benefit from skilled physical therapy intervention to address current body structure impairments and activity limitations to improve function and work towards goals set in current POC in order to return to prior level of function or maximal functional improvement.    OBJECTIVE IMPAIRMENTS Abnormal gait, decreased activity tolerance, decreased balance, decreased endurance, decreased knowledge of condition, decreased  knowledge of use of DME, decreased mobility, difficulty walking, decreased ROM, decreased strength, hypomobility, increased fascial restrictions, impaired perceived functional ability, increased muscle spasms, impaired flexibility, impaired sensation, improper body mechanics, postural dysfunction, and pain.    ACTIVITY LIMITATIONS carrying, lifting, bending, standing, squatting, sleeping, stairs, transfers, bed mobility, dressing, and locomotion level   PARTICIPATION LIMITATIONS: interpersonal relationship, driving, community activity, yard work, and   usual activities including sleeping, lifting, tying shoes, getting dressed, keeping balance while upright, prolonged walking on hard surface, walking without a cane, hobbies, climbing ladders (instructed not to), stairs, playing basketball, running, stopping, athletic endeavors   PERSONAL FACTORS Age, Behavior pattern, Past/current experiences, Profession, Time since onset of injury/illness/exacerbation, and 3+ comorbidities:   arthritis, BPH, bursitis of hips, basal and squamous cell cancer, bilateral cataracts, heart murmur (asymptomatic), hemorrhoids, history of kidney stones, history of colon polyps, hypercholesteremia, Prostatic intraepithelial neoplasia III, spinal stenosis of lumbar region, umbilical hernia, L ankle fracture surgery (2009), right carpal tunnel release (2007), cataract extraction bilateral, cytoscopy with insertion of urolift (07/2021), ORIF DISTAL RADIUS FRACTURE AND PERTROCHANTERIC FEMUR FRACTURE (2009), hernia repair (1964), L knee surgery (torn meniscus 1984), R knee surgery (2005, torn meniscus), L2-S1 lumbar decompression including central laminectomy and bilateral medial facetectomies including foraminotomies on 07/02/2021, right shoulder pain are also  affecting patient's functional outcome.    REHAB POTENTIAL: Good   CLINICAL DECISION MAKING: Stable/uncomplicated   EVALUATION COMPLEXITY: Low     GOALS: Goals reviewed with  patient? No   SHORT TERM GOALS: Target date: 09/29/2021   Patient will be independent with initial home exercise program for self-management of symptoms. Baseline: Initial HEP to be provided at visit 2 as appropriate (09/15/21); initial HEP provided visit 2 (09/22/2021);  Goal status: In-progress     LONG TERM GOALS: Target date: 12/08/2021   Patient will be independent with a long-term home exercise program for self-management of symptoms.  Baseline: Initial HEP to be provided at visit 2 as appropriate (09/15/21); initial HEP provided visit 2 (09/22/2021);  Goal status: In-progress   2.  Patient will demonstrate improved FOTO to equal or greater than 60 by visit #13 to demonstrate improvement in overall condition and self-reported functional ability.  Baseline: 56 (09/15/21); 55 at visit #5 (10/07/2021);  Goal status: In-progress   3.  Patient will demonstrate the ability to stand in tandem stance for equal or greater than 30 seconds with each foot forwards to demonstrate decreased fall risk.  Baseline: R = 9 seconds; L = 3 seconds (09/15/21); Goal status: In-progress   4.  Patient will score equal or greater than 25/30 on Functional Gait Assessment to demonstrate low fall risk (09/15/2021);  Baseline: to be tested visit 2 as appropriate (09/15/21); 20/30 moderate fall risk (09/22/2021);  Goal status: In-progress   5.  Patient will complete community, work and/or recreational activities without limitation due to current condition.  Baseline: difficulty with usual activities including sleeping, lifting, tying shoes, getting dressed, keeping balance while upright, prolonged walking on hard surface, walking without a cane, hobbies, climbing ladders (instructed not to), stairs, playing basketball, running, stopping, athletic endeavors (09/15/21); Goal status: In-progress   PLAN: PT FREQUENCY: 1-2x/week   PT DURATION: 12 weeks   PLANNED INTERVENTIONS: Therapeutic exercises, Therapeutic  activity, Neuromuscular re-education, Balance training, Gait training, Patient/Family education, Self Care, Joint mobilization, Stair training, DME instructions, Electrical stimulation, Spinal mobilization, Cryotherapy, Moist heat, Manual therapy, and Re-evaluation.   PLAN FOR NEXT SESSION: update HEP as appropriate, balance interventions, LE and functional strengthening and ROM exercises as appropriate.   Everlean Alstrom. Graylon Good, PT, DPT 10/21/21, 8:52 PM  [facility] [address] [place], [place] [address] P: [phone] I F: [phone]

## 2021-10-25 ENCOUNTER — Encounter: Payer: Self-pay | Admitting: Physical Therapy

## 2021-10-25 ENCOUNTER — Ambulatory Visit: Payer: Medicare Other | Admitting: Physical Therapy

## 2021-10-25 DIAGNOSIS — R2681 Unsteadiness on feet: Secondary | ICD-10-CM | POA: Diagnosis not present

## 2021-10-25 DIAGNOSIS — R209 Unspecified disturbances of skin sensation: Secondary | ICD-10-CM

## 2021-10-25 DIAGNOSIS — M5459 Other low back pain: Secondary | ICD-10-CM

## 2021-10-25 NOTE — Therapy (Signed)
OUTPATIENT PHYSICAL THERAPY TREATMENT NOTE   Patient Name: Devin Landry MRN: [id number] DOB:1940-07-29, 81 y.o., male Today's Date: 10/25/2021  PCP: Baxter Hire, MD REFERRING PROVIDER: Meade Maw, MD  END OF SESSION:   PT End of Session - 10/25/21 1150     Visit Number 9    Number of Visits 24    Date for PT Re-Evaluation 12/08/21    Authorization Type [employer] MEDICARE reporting period from 09/15/2021    Progress Note Due on Visit 10    PT Start Time 1120    PT Stop Time 1200    PT Time Calculation (min) 40 min    Equipment Utilized During Treatment Gait belt    Activity Tolerance Patient tolerated treatment well    Behavior During Therapy WFL for tasks assessed/performed   verbose, requiring redirection to stay on topic at times             Past Medical History:  Diagnosis Date   Allergic state    Arthritis    BPH (benign prostatic hyperplasia)    Bursitis    hips   Cancer ([place])    basal and squamous cell   Cataracts, bilateral    ED (erectile dysfunction)    Elevated PSA    Heart murmur    born with mumur-asymptomatic   Hemorrhoids    History of chicken pox    History of colon polyps    History of kidney stones    Hypercholesteremia    Hypogonadism in male    Nephrolithiasis    PIN III (prostatic intraepithelial neoplasia III)    Spinal stenosis of lumbar region    Umbilical hernia    Past Surgical History:  Procedure Laterality Date   ANKLE FRACTURE SURGERY Left 10/10/2007   CARPAL TUNNEL RELEASE Right 2007   CATARACT EXTRACTION, BILATERAL Bilateral    COLONOSCOPY     1993, 1996, 2000, 2005,2010, 2015, 2018   COLONOSCOPY WITH PROPOFOL N/A 09/21/2016   Procedure: COLONOSCOPY WITH PROPOFOL;  Surgeon: Manya Silvas, MD;  Location: [facility] ENDOSCOPY;  Service: Endoscopy;  Laterality: N/A;   CYSTOSCOPY W/ RETROGRADES  04/13/2021   Procedure: CYSTOSCOPY WITH RETROGRADE PYELOGRAM;  Surgeon: Abbie Sons, MD;  Location: [facility] ORS;  Service:  Urology;;   CYSTOSCOPY WITH INSERTION OF UROLIFT N/A 07/27/2021   Procedure: CYSTOSCOPY WITH INSERTION OF UROLIFT;  Surgeon: Abbie Sons, MD;  Location: [facility] ORS;  Service: Urology;  Laterality: N/A;   CYSTOSCOPY/URETEROSCOPY/HOLMIUM LASER/STENT PLACEMENT Right 04/13/2021   Procedure: CYSTOSCOPY/URETEROSCOPY/HOLMIUM LASER/STENT PLACEMENT;  Surgeon: Abbie Sons, MD;  Location: [facility] ORS;  Service: Urology;  Laterality: Right;   FEMUR FRACTURE SURGERY Left 10/10/2007   FRACTURE SURGERY Left 10/10/2007   ORIF DISTAL RADIUS FRACTURE AND PERTROCHANTERIC FEMUR FRACTURE   HERNIA REPAIR Bilateral 1964   KNEE SURGERY Left 1984   torn meniscus   KNEE SURGERY Right 2005   torn meniscus   LUMBAR LAMINECTOMY/DECOMPRESSION MICRODISCECTOMY N/A 07/02/2021   Procedure: L2-S1 DECOMPRESSION;  Surgeon: Meade Maw, MD;  Location: [facility] ORS;  Service: Neurosurgery;  Laterality: N/A;   Patient Active Problem List   Diagnosis Date Noted   Status post lumbar spine operation 07/02/2021   Colon polyp 09/19/2018   Other chronic pain 07/24/2017   Pain in joint of right shoulder 05/23/2017   Hx of adenomatous colonic polyps 06/20/2016   Asthma without status asthmaticus 04/23/2014   High cholesterol 04/23/2014   Benign prostatic hyperplasia with lower urinary tract symptoms 07/15/2013   Benign neoplasm  of colon 07/15/2013   ED (erectile dysfunction) of organic origin 12/01/2011   History of elevated PSA 12/01/2011   Nephrolithiasis 12/01/2011   PIN III (prostatic intraepithelial neoplasm III) 12/01/2011    REFERRING DIAG: Imbalance  THERAPY DIAG:  Unsteadiness on feet  Other low back pain  Unspecified disturbances of skin sensation  Rationale for Evaluation and Treatment Rehabilitation  PERTINENT HISTORY: Patient is a 81 y.o. male who presents to outpatient physical therapy with a referral for medical diagnosis imbalance. This patient's chief complaints consist of paresthesia in L > R LE  that affects his balance and ambulation leading to the following functional deficits: difficulty with usual activities including sleeping, lifting, tying shoes, getting dressed, keeping balance while upright, prolonged walking on hard surface, walking without a cane, hobbies, climbing ladders (instructed not to), stairs, playing basketball, running, stopping, athletic endeavors. Relevant past medical history and comorbidities include arthritis, BPH, bursitis of hips, basal and squamous cell cancer, bilateral cataracts, heart murmur (asymptomatic), hemorrhoids, history of kidney stones, history of colon polyps, hypercholesteremia, Prostatic intraepithelial neoplasia III, spinal stenosis of lumbar region, umbilical hernia, L ankle fracture surgery (2009), right carpal tunnel release (2007), cataract extraction bilateral, cytoscopy with insertion of urolift (07/2021), ORIF DISTAL RADIUS FRACTURE AND PERTROCHANTERIC FEMUR FRACTURE (2009), hernia repair (1964), L knee surgery (torn meniscus 1984), R knee surgery (2005, torn meniscus), L2-S1 lumbar decompression including central laminectomy and bilateral medial facetectomies including foraminotomies on 07/02/2021, right shoulder pain.      PRECAUTIONS: lifted  SUBJECTIVE: Patient reports he was only a little sore after last PT session and it was in his left leg more than his right. He continues to do water walking in the mornings and his HEP.   PAIN:  Are you having pain? He reports no pain   OBJECTIVE  TODAY'S TREATMENT   Therapeutic exercise: to centralize symptoms and improve ROM, strength, muscular endurance, and activity tolerance required for successful completion of functional activities.  - seated double leg quad extension, 1x10 at 25# to warm up knee joints.  - seated single leg quad extension, 3x10 each side on OMEGA machine, 25#. Much more difficult on the right compared to the left.  - seated double leg hamstring curl, 1x10 each side on OMEGA  machine, 2x10 each side at 30#. - seated single leg hamstring curl, 2x10 each side on OMEGA machine, 3x10 each side at 30/20/25#. - TOTAL GYM double leg squat, 1x10 at level 10 - TOTAL GYM single leg squat, 10x10 each side at level 10.  - heel raises with forefeet elevated on 2x4 board, 2x10 with B UE support.   Neuromuscular Re-education: to improve, balance, postural strength, muscle activation patterns, and stabilization strength required for functional activities: - lateral walking on 5-foot airex beam, 1x10-11 each direction, no UE support,  CGA.  - backwards walking 3x30 feet CGA - backwards mini-lunge, ~ 5 on each side, SBA (diffiult and replicates where patient feels his knees might buckle).   Pt required multimodal cuing for proper technique and to facilitate improved neuromuscular control, strength, range of motion, and functional ability resulting in improved performance and form.   PATIENT EDUCATION:  Education details:  Exercise purpose/form. Self management techniques.  Person educated: Patient Education method: Explanation and Demonstration, verbal and tactile cuing.  Education comprehension: verbalized understanding, returned demonstration and needs further education     HOME EXERCISE PROGRAM: Access Code: [email or web address] URL: [email or web address] Date: 10/04/2021 Prepared by: Rosita Kea  Exercises - Tandem Stance in Corner  - 1  x daily - 3 reps - 1 min hold - Therapist, sports Feet Together With Eyes Closed  - 1 x daily - 3 reps - 1 min hold - Half Tandem Stance Balance with Head Rotation  - 1 x daily - 2-3 sets - 20 reps - [employer] on Counter  - 1 x daily - 3 sets - 10 reps - 5 seconds hold - Single-Leg Anti-Rotation Press With Anchored Resistance  - 3 x weekly - 1-2 sets - 10 reps   ASSESSMENT:   CLINICAL IMPRESSION: Patient arrives with good tolerance to last PT session so exercise volume was increased today with apparently good tolerance. Session  continued to focus on LE strengthening to improve LE stability and ability to catch himself of patient requires step strategy to prevent fall. Plan to continue with LE strengthening and balance next session as appropriate. Patient would benefit from continued management of limiting condition by skilled physical therapist to address remaining impairments and functional limitations to work towards stated goals and return to PLOF or maximal functional independence.   From PT eval on 09/15/2021: Patient is a 81 y.o. male referred to outpatient physical therapy with a medical diagnosis of imbalance who presents with signs and symptoms consistent with unsteadiness on feet and difficulty walking s/p L2-S1 lumbar decompression including central laminectomy and bilateral medial facetectomies including foraminotomies on 07/02/2021. Patient has loss of sensation on L LE contributing to unsteadiness as well as altered back, hip, and knee posture and range of motion. Patient presents with significant pain, paresthesia, sensation, ROM, posture, joint stiffness, balance, gait, muscle performance (strength/power/endurance), activity tolerance, motor control, and proprioception impairments that are limiting ability to complete his usual activities including sleeping, lifting, tying shoes, getting dressed, keeping balance while upright, prolonged walking on hard surface, walking without a cane, hobbies, climbing ladders (instructed not to), stairs, playing basketball, running, stopping, athletic endeavors without difficulty. Patient will benefit from skilled physical therapy intervention to address current body structure impairments and activity limitations to improve function and work towards goals set in current POC in order to return to prior level of function or maximal functional improvement.    OBJECTIVE IMPAIRMENTS Abnormal gait, decreased activity tolerance, decreased balance, decreased endurance, decreased knowledge of  condition, decreased knowledge of use of DME, decreased mobility, difficulty walking, decreased ROM, decreased strength, hypomobility, increased fascial restrictions, impaired perceived functional ability, increased muscle spasms, impaired flexibility, impaired sensation, improper body mechanics, postural dysfunction, and pain.    ACTIVITY LIMITATIONS carrying, lifting, bending, standing, squatting, sleeping, stairs, transfers, bed mobility, dressing, and locomotion level   PARTICIPATION LIMITATIONS: interpersonal relationship, driving, community activity, yard work, and   usual activities including sleeping, lifting, tying shoes, getting dressed, keeping balance while upright, prolonged walking on hard surface, walking without a cane, hobbies, climbing ladders (instructed not to), stairs, playing basketball, running, stopping, athletic endeavors   PERSONAL FACTORS Age, Behavior pattern, Past/current experiences, Profession, Time since onset of injury/illness/exacerbation, and 3+ comorbidities:   arthritis, BPH, bursitis of hips, basal and squamous cell cancer, bilateral cataracts, heart murmur (asymptomatic), hemorrhoids, history of kidney stones, history of colon polyps, hypercholesteremia, Prostatic intraepithelial neoplasia III, spinal stenosis of lumbar region, umbilical hernia, L ankle fracture surgery (2009), right carpal tunnel release (2007), cataract extraction bilateral, cytoscopy with insertion of urolift (07/2021), ORIF DISTAL RADIUS FRACTURE AND PERTROCHANTERIC FEMUR FRACTURE (2009), hernia repair (1964), L knee surgery (torn meniscus 1984), R knee surgery (2005, torn meniscus), L2-S1 lumbar decompression including central laminectomy and bilateral medial facetectomies including  foraminotomies on 07/02/2021, right shoulder pain are also affecting patient's functional outcome.    REHAB POTENTIAL: Good   CLINICAL DECISION MAKING: Stable/uncomplicated   EVALUATION COMPLEXITY: Low      GOALS: Goals reviewed with patient? No   SHORT TERM GOALS: Target date: 09/29/2021   Patient will be independent with initial home exercise program for self-management of symptoms. Baseline: Initial HEP to be provided at visit 2 as appropriate (09/15/21); initial HEP provided visit 2 (09/22/2021);  Goal status: In-progress     LONG TERM GOALS: Target date: 12/08/2021   Patient will be independent with a long-term home exercise program for self-management of symptoms.  Baseline: Initial HEP to be provided at visit 2 as appropriate (09/15/21); initial HEP provided visit 2 (09/22/2021);  Goal status: In-progress   2.  Patient will demonstrate improved FOTO to equal or greater than 60 by visit #13 to demonstrate improvement in overall condition and self-reported functional ability.  Baseline: 56 (09/15/21); 55 at visit #5 (10/07/2021);  Goal status: In-progress   3.  Patient will demonstrate the ability to stand in tandem stance for equal or greater than 30 seconds with each foot forwards to demonstrate decreased fall risk.  Baseline: R = 9 seconds; L = 3 seconds (09/15/21); Goal status: In-progress   4.  Patient will score equal or greater than 25/30 on Functional Gait Assessment to demonstrate low fall risk (09/15/2021);  Baseline: to be tested visit 2 as appropriate (09/15/21); 20/30 moderate fall risk (09/22/2021);  Goal status: In-progress   5.  Patient will complete community, work and/or recreational activities without limitation due to current condition.  Baseline: difficulty with usual activities including sleeping, lifting, tying shoes, getting dressed, keeping balance while upright, prolonged walking on hard surface, walking without a cane, hobbies, climbing ladders (instructed not to), stairs, playing basketball, running, stopping, athletic endeavors (09/15/21); Goal status: In-progress   PLAN: PT FREQUENCY: 1-2x/week   PT DURATION: 12 weeks   PLANNED INTERVENTIONS:  Therapeutic exercises, Therapeutic activity, Neuromuscular re-education, Balance training, Gait training, Patient/Family education, Self Care, Joint mobilization, Stair training, DME instructions, Electrical stimulation, Spinal mobilization, Cryotherapy, Moist heat, Manual therapy, and Re-evaluation.   PLAN FOR NEXT SESSION: update HEP as appropriate, balance interventions, LE and functional strengthening and ROM exercises as appropriate.   Everlean Alstrom. Graylon Good, PT, DPT 10/25/21, 12:22 PM  [facility] [address] [place], [place] [address] P: [phone] I F: [phone]

## 2021-10-27 ENCOUNTER — Ambulatory Visit: Payer: Medicare Other | Admitting: Physical Therapy

## 2021-10-27 ENCOUNTER — Encounter: Payer: Self-pay | Admitting: Physical Therapy

## 2021-10-27 DIAGNOSIS — M5459 Other low back pain: Secondary | ICD-10-CM

## 2021-10-27 DIAGNOSIS — R2681 Unsteadiness on feet: Secondary | ICD-10-CM | POA: Diagnosis not present

## 2021-10-27 DIAGNOSIS — R209 Unspecified disturbances of skin sensation: Secondary | ICD-10-CM

## 2021-10-27 NOTE — Therapy (Unsigned)
OUTPATIENT PHYSICAL THERAPY TREATMENT NOTE / PROGRESS NOTE  Dates of reporting from 09/15/2021 to 10/27/2021   Patient Name: Devin Landry MRN: [id number] DOB:Nov 09, 1940, 81 y.o., male Today's Date: 10/27/2021  PCP: Baxter Hire, MD REFERRING PROVIDER: Meade Maw, MD  END OF SESSION:   PT End of Session - 10/27/21 0844     Visit Number 10    Number of Visits 24    Date for PT Re-Evaluation 12/08/21    Authorization Type [employer] MEDICARE reporting period from 09/15/2021    Progress Note Due on Visit 10    PT Start Time 0820    PT Stop Time 0900    PT Time Calculation (min) 40 min    Equipment Utilized During Treatment Gait belt    Activity Tolerance Patient tolerated treatment well    Behavior During Therapy [facility] for tasks assessed/performed   verbose, requiring redirection to stay on topic at times            Past Medical History:  Diagnosis Date   Allergic state    Arthritis    BPH (benign prostatic hyperplasia)    Bursitis    hips   Cancer ([place])    basal and squamous cell   Cataracts, bilateral    ED (erectile dysfunction)    Elevated PSA    Heart murmur    born with mumur-asymptomatic   Hemorrhoids    History of chicken pox    History of colon polyps    History of kidney stones    Hypercholesteremia    Hypogonadism in male    Nephrolithiasis    PIN III (prostatic intraepithelial neoplasia III)    Spinal stenosis of lumbar region    Umbilical hernia    Past Surgical History:  Procedure Laterality Date   ANKLE FRACTURE SURGERY Left 10/10/2007   CARPAL TUNNEL RELEASE Right 2007   CATARACT EXTRACTION, BILATERAL Bilateral    COLONOSCOPY     1993, 1996, 2000, 2005,2010, 2015, 2018   COLONOSCOPY WITH PROPOFOL N/A 09/21/2016   Procedure: COLONOSCOPY WITH PROPOFOL;  Surgeon: Manya Silvas, MD;  Location: [facility] ENDOSCOPY;  Service: Endoscopy;  Laterality: N/A;   CYSTOSCOPY W/ RETROGRADES  04/13/2021   Procedure: CYSTOSCOPY WITH RETROGRADE PYELOGRAM;   Surgeon: Abbie Sons, MD;  Location: [facility] ORS;  Service: Urology;;   CYSTOSCOPY WITH INSERTION OF UROLIFT N/A 07/27/2021   Procedure: CYSTOSCOPY WITH INSERTION OF UROLIFT;  Surgeon: Abbie Sons, MD;  Location: [facility] ORS;  Service: Urology;  Laterality: N/A;   CYSTOSCOPY/URETEROSCOPY/HOLMIUM LASER/STENT PLACEMENT Right 04/13/2021   Procedure: CYSTOSCOPY/URETEROSCOPY/HOLMIUM LASER/STENT PLACEMENT;  Surgeon: Abbie Sons, MD;  Location: [facility] ORS;  Service: Urology;  Laterality: Right;   FEMUR FRACTURE SURGERY Left 10/10/2007   FRACTURE SURGERY Left 10/10/2007   ORIF DISTAL RADIUS FRACTURE AND PERTROCHANTERIC FEMUR FRACTURE   HERNIA REPAIR Bilateral 1964   KNEE SURGERY Left 1984   torn meniscus   KNEE SURGERY Right 2005   torn meniscus   LUMBAR LAMINECTOMY/DECOMPRESSION MICRODISCECTOMY N/A 07/02/2021   Procedure: L2-S1 DECOMPRESSION;  Surgeon: Meade Maw, MD;  Location: [facility] ORS;  Service: Neurosurgery;  Laterality: N/A;   Patient Active Problem List   Diagnosis Date Noted   Status post lumbar spine operation 07/02/2021   Colon polyp 09/19/2018   Other chronic pain 07/24/2017   Pain in joint of right shoulder 05/23/2017   Hx of adenomatous colonic polyps 06/20/2016   Asthma without status asthmaticus 04/23/2014   High cholesterol 04/23/2014   Benign prostatic hyperplasia  with lower urinary tract symptoms 07/15/2013   Benign neoplasm of colon 07/15/2013   ED (erectile dysfunction) of organic origin 12/01/2011   History of elevated PSA 12/01/2011   Nephrolithiasis 12/01/2011   PIN III (prostatic intraepithelial neoplasm III) 12/01/2011    REFERRING DIAG: Imbalance  THERAPY DIAG:  Unsteadiness on feet  Other low back pain  Unspecified disturbances of skin sensation  Rationale for Evaluation and Treatment Rehabilitation  PERTINENT HISTORY: Patient is a 81 y.o. male who presents to outpatient physical therapy with a referral for medical diagnosis imbalance. This  patient's chief complaints consist of paresthesia in L > R LE that affects his balance and ambulation leading to the following functional deficits: difficulty with usual activities including sleeping, lifting, tying shoes, getting dressed, keeping balance while upright, prolonged walking on hard surface, walking without a cane, hobbies, climbing ladders (instructed not to), stairs, playing basketball, running, stopping, athletic endeavors. Relevant past medical history and comorbidities include arthritis, BPH, bursitis of hips, basal and squamous cell cancer, bilateral cataracts, heart murmur (asymptomatic), hemorrhoids, history of kidney stones, history of colon polyps, hypercholesteremia, Prostatic intraepithelial neoplasia III, spinal stenosis of lumbar region, umbilical hernia, L ankle fracture surgery (2009), right carpal tunnel release (2007), cataract extraction bilateral, cytoscopy with insertion of urolift (07/2021), ORIF DISTAL RADIUS FRACTURE AND PERTROCHANTERIC FEMUR FRACTURE (2009), hernia repair (1964), L knee surgery (torn meniscus 1984), R knee surgery (2005, torn meniscus), L2-S1 lumbar decompression including central laminectomy and bilateral medial facetectomies including foraminotomies on 07/02/2021, right shoulder pain.      PRECAUTIONS: lifted  SUBJECTIVE: Patient arrives with [facility], he reports daily activities and driving has gotten better since starting PT. He has improved donning shoes and socks, dressing, doing more walking, still not lifting many weights but not yelling for assistance to lift a flower pot or clothes; estimates 60-70% improvement since starting PT. He still has some left LE numbness. He states he was not sore after last PT session but had some tightness in his left calf. He has not started walking with a weight yet. He asks for assistance to carry groceries up his 4-5 steps.   PAIN:  Are you having pain? He reports no pain   OBJECTIVE  SELF-REPORTED FUNCTION FOTO  score: 59/100 (balance questionnaire)  Sharpened Romberg test: -Tandem stance, eyes open: R = 11 seconds; L = 3 seconds.    [facility] PT Assessment - 10/27/21 0001       Functional Gait  Assessment   Gait Level Surface Walks 20 ft in less than 5.5 sec, no assistive devices, good speed, no evidence for imbalance, normal gait pattern, deviates no more than 6 in outside of the 12 in walkway width.   self selected: 4.91 seconds; fast: 3.78 seconds   Change in Gait Speed Able to smoothly change walking speed without loss of balance or gait deviation. Deviate no more than 6 in outside of the 12 in walkway width.    Gait with Horizontal Head Turns Performs head turns smoothly with slight change in gait velocity (eg, minor disruption to smooth gait path), deviates 6-10 in outside 12 in walkway width, or uses an assistive device.    Gait with Vertical Head Turns Performs head turns with no change in gait. Deviates no more than 6 in outside 12 in walkway width.    Gait and Pivot Turn Pivot turns safely within 3 sec and stops quickly with no loss of balance.    Step Over Obstacle Is able to step over 2  stacked shoe boxes taped together (9 in total height) without changing gait speed. No evidence of imbalance.    Gait with Narrow Base of Support Ambulates less than 4 steps heel to toe or cannot perform without assistance.   lacks ROM due to left hip surgery   Gait with Eyes Closed Walks 20 ft, slow speed, abnormal gait pattern, evidence for imbalance, deviates 10-15 in outside 12 in walkway width. Requires more than 9 sec to ambulate 20 ft.   veers to left, 10 seconds   Ambulating Backwards Walks 20 ft, uses assistive device, slower speed, mild gait deviations, deviates 6-10 in outside 12 in walkway width.    Steps Alternating feet, must use rail.    Total Score 22    FGA comment: 19-24 = medium risk fall   no AD used              TODAY'S TREATMENT   Therapeutic exercise: to centralize symptoms and  improve ROM, strength, muscular endurance, and activity tolerance required for successful completion of functional activities.  - wall sags, 2x10 with 5 second hold.  - step up to 6 inch step with B UE support, 2x10 each side - heel raises with forefeet elevated on 2x4 board, 2x10 with B UE support.  - Education on HEP including handout   Neuromuscular Re-education: to improve, balance, postural strength, muscle activation patterns, and stabilization strength required for functional activities: - FGA to assess progress (see above).  - static tandem stance to assess progress (see above)  Pt required multimodal cuing for proper technique and to facilitate improved neuromuscular control, strength, range of motion, and functional ability resulting in improved performance and form.   PATIENT EDUCATION:  Education details:  Exercise purpose/form. Self management techniques.  Person educated: Patient Education method: Explanation and Demonstration, verbal and tactile cuing.  Education comprehension: verbalized understanding, returned demonstration and needs further education     HOME EXERCISE PROGRAM: Access Code: [email or web address] URL: [email or web address] Date: 10/27/2021 Prepared by: Rosita Kea  Exercises - Tandem Stance in Corner  - 1 x daily - 3 reps - 1 min hold - Corner Balance Feet Together With Eyes Closed  - 1 x daily - 3 reps - 1 min hold - Half Tandem Stance Balance with Head Rotation  - 1 x daily - 2-3 sets - 20 reps - [employer] on Counter  - 1 x daily - 3 sets - 10 reps - 5 seconds hold - Single-Leg Anti-Rotation Press With Anchored Resistance  - 3 x weekly - 1-2 sets - 10 reps - Heel Raises with Counter Support  - 3 x weekly - 3 sets - 10 reps - Standing Lumbar Extension at [place]  - 1-2 x daily - 2 sets - 10 reps - 5 second hold - Forward Step Up with Counter Support  - 3 x weekly - 2-3 sets - 10 reps   ASSESSMENT:   CLINICAL IMPRESSION: Patient  has attended 10 physical therapy sessions since starting current episode of care on 09/15/2021. Patient has participated well and reports 60-70% improvement in his functional ability and has not needed as much assistance with lifting laundry or flower pots. The numbness in his left LE continues to persist which negatively effects his balance and his R knee gives way at times which negatively effects his balance. He also continues to have a stooped posture and bilateral genuvarum that negatively affects his gait. Patient has been progressing in his activity tolerance for quad and  LE strengthening. Since starting PT his FGA score stayed the same in the moderate fall risk category. His FOTO score for balance has improved. Patient has not yet reached his potential for likely improvement and remains a fall risk. Plan to continue PT as outlined in original POC. Patient would benefit from continued management of limiting condition by skilled physical therapist to address remaining impairments and functional limitations to work towards stated goals and return to PLOF or maximal functional independence.   From PT eval on 09/15/2021: Patient is a 81 y.o. male referred to outpatient physical therapy with a medical diagnosis of imbalance who presents with signs and symptoms consistent with unsteadiness on feet and difficulty walking s/p L2-S1 lumbar decompression including central laminectomy and bilateral medial facetectomies including foraminotomies on 07/02/2021. Patient has loss of sensation on L LE contributing to unsteadiness as well as altered back, hip, and knee posture and range of motion. Patient presents with significant pain, paresthesia, sensation, ROM, posture, joint stiffness, balance, gait, muscle performance (strength/power/endurance), activity tolerance, motor control, and proprioception impairments that are limiting ability to complete his usual activities including sleeping, lifting, tying shoes, getting  dressed, keeping balance while upright, prolonged walking on hard surface, walking without a cane, hobbies, climbing ladders (instructed not to), stairs, playing basketball, running, stopping, athletic endeavors without difficulty. Patient will benefit from skilled physical therapy intervention to address current body structure impairments and activity limitations to improve function and work towards goals set in current POC in order to return to prior level of function or maximal functional improvement.    OBJECTIVE IMPAIRMENTS Abnormal gait, decreased activity tolerance, decreased balance, decreased endurance, decreased knowledge of condition, decreased knowledge of use of DME, decreased mobility, difficulty walking, decreased ROM, decreased strength, hypomobility, increased fascial restrictions, impaired perceived functional ability, increased muscle spasms, impaired flexibility, impaired sensation, improper body mechanics, postural dysfunction, and pain.    ACTIVITY LIMITATIONS carrying, lifting, bending, standing, squatting, sleeping, stairs, transfers, bed mobility, dressing, and locomotion level   PARTICIPATION LIMITATIONS: interpersonal relationship, driving, community activity, yard work, and   usual activities including sleeping, lifting, tying shoes, getting dressed, keeping balance while upright, prolonged walking on hard surface, walking without a cane, hobbies, climbing ladders (instructed not to), stairs, playing basketball, running, stopping, athletic endeavors   PERSONAL FACTORS Age, Behavior pattern, Past/current experiences, Profession, Time since onset of injury/illness/exacerbation, and 3+ comorbidities:   arthritis, BPH, bursitis of hips, basal and squamous cell cancer, bilateral cataracts, heart murmur (asymptomatic), hemorrhoids, history of kidney stones, history of colon polyps, hypercholesteremia, Prostatic intraepithelial neoplasia III, spinal stenosis of lumbar region, umbilical  hernia, L ankle fracture surgery (2009), right carpal tunnel release (2007), cataract extraction bilateral, cytoscopy with insertion of urolift (07/2021), ORIF DISTAL RADIUS FRACTURE AND PERTROCHANTERIC FEMUR FRACTURE (2009), hernia repair (1964), L knee surgery (torn meniscus 1984), R knee surgery (2005, torn meniscus), L2-S1 lumbar decompression including central laminectomy and bilateral medial facetectomies including foraminotomies on 07/02/2021, right shoulder pain are also affecting patient's functional outcome.    REHAB POTENTIAL: Good   CLINICAL DECISION MAKING: Stable/uncomplicated   EVALUATION COMPLEXITY: Low     GOALS: Goals reviewed with patient? No   SHORT TERM GOALS: Target date: 09/29/2021   Patient will be independent with initial home exercise program for self-management of symptoms. Baseline: Initial HEP to be provided at visit 2 as appropriate (09/15/21); initial HEP provided visit 2 (09/22/2021);  Goal status: In-progress     LONG TERM GOALS: Target date: 12/08/2021   Patient will be independent  with a long-term home exercise program for self-management of symptoms.  Baseline: Initial HEP to be provided at visit 2 as appropriate (09/15/21); initial HEP provided visit 2 (09/22/2021); participating well (10/27/2021);  Goal status: In-progress   2.  Patient will demonstrate improved FOTO to equal or greater than 60 by visit #13 to demonstrate improvement in overall condition and self-reported functional ability.  Baseline: 56 (09/15/21); 55 at visit #5 (10/07/2021); 59 at visit # 10 (10/27/2021);  Goal status: In-progress   3.  Patient will demonstrate the ability to stand in tandem stance for equal or greater than 30 seconds with each foot forwards to demonstrate decreased fall risk.  Baseline: R = 9 seconds; L = 3 seconds (09/15/21); R = 11 seconds; L = 3 seconds (10/27/2021);  Goal status: In-progress   4.  Patient will score equal or greater than 25/30 on Functional  Gait Assessment to demonstrate low fall risk (09/15/2021);  Baseline: to be tested visit 2 as appropriate (09/15/21); 20/30 moderate fall risk (09/22/2021); 22/30 (10/27/2021);  Goal status: In-progress   5.  Patient will complete community, work and/or recreational activities without limitation due to current condition.  Baseline: difficulty with usual activities including sleeping, lifting, tying shoes, getting dressed, keeping balance while upright, prolonged walking on hard surface, walking without a cane, hobbies, climbing ladders (instructed not to), stairs, playing basketball, running, stopping, athletic endeavors (09/15/21); improved donning shoes and socks, dressing, doing more walking, still not lifting many weights but not yelling for assistance to lift a flower pot or clothes; estimates 60-70% improvement since starting PT (10/27/2021);  Goal status: In-progress   PLAN: PT FREQUENCY: 1-2x/week   PT DURATION: 12 weeks   PLANNED INTERVENTIONS: Therapeutic exercises, Therapeutic activity, Neuromuscular re-education, Balance training, Gait training, Patient/Family education, Self Care, Joint mobilization, Stair training, DME instructions, Electrical stimulation, Spinal mobilization, Cryotherapy, Moist heat, Manual therapy, and Re-evaluation.   PLAN FOR NEXT SESSION: update HEP as appropriate, balance interventions, LE and functional strengthening and ROM exercises as appropriate.   Everlean Alstrom. Graylon Good, PT, DPT 10/27/21, 8:46 AM  [facility] [address] [place], [place] [address] P: [phone] I F: [phone]

## 2021-11-01 ENCOUNTER — Ambulatory Visit: Payer: Medicare Other | Admitting: Physical Therapy

## 2021-11-01 ENCOUNTER — Encounter: Payer: Self-pay | Admitting: Physical Therapy

## 2021-11-01 DIAGNOSIS — R2681 Unsteadiness on feet: Secondary | ICD-10-CM

## 2021-11-01 DIAGNOSIS — R209 Unspecified disturbances of skin sensation: Secondary | ICD-10-CM

## 2021-11-01 DIAGNOSIS — M5459 Other low back pain: Secondary | ICD-10-CM

## 2021-11-01 NOTE — Therapy (Signed)
OUTPATIENT PHYSICAL THERAPY TREATMENT NOTE   Patient Name: ISSAAC Landry MRN: [id number] DOB:Nov 22, 1940, 81 y.o., male Today's Date: 11/01/2021  PCP: Baxter Hire, MD REFERRING PROVIDER: Meade Maw, MD  END OF SESSION:   PT End of Session - 11/01/21 1120     Visit Number 11    Number of Visits 24    Date for PT Re-Evaluation 12/08/21    Authorization Type [employer] MEDICARE reporting period from 10/27/2021    Progress Note Due on Visit 11    PT Start Time 1119    PT Stop Time 1159    PT Time Calculation (min) 40 min    Equipment Utilized During Treatment Gait belt    Activity Tolerance Patient tolerated treatment well    Behavior During Therapy WFL for tasks assessed/performed   verbose, requiring redirection to stay on topic at times             Past Medical History:  Diagnosis Date   Allergic state    Arthritis    BPH (benign prostatic hyperplasia)    Bursitis    hips   Cancer ([place])    basal and squamous cell   Cataracts, bilateral    ED (erectile dysfunction)    Elevated PSA    Heart murmur    born with mumur-asymptomatic   Hemorrhoids    History of chicken pox    History of colon polyps    History of kidney stones    Hypercholesteremia    Hypogonadism in male    Nephrolithiasis    PIN III (prostatic intraepithelial neoplasia III)    Spinal stenosis of lumbar region    Umbilical hernia    Past Surgical History:  Procedure Laterality Date   ANKLE FRACTURE SURGERY Left 10/10/2007   CARPAL TUNNEL RELEASE Right 2007   CATARACT EXTRACTION, BILATERAL Bilateral    COLONOSCOPY     1993, 1996, 2000, 2005,2010, 2015, 2018   COLONOSCOPY WITH PROPOFOL N/A 09/21/2016   Procedure: COLONOSCOPY WITH PROPOFOL;  Surgeon: Manya Silvas, MD;  Location: [facility];  Service: Endoscopy;  Laterality: N/A;   CYSTOSCOPY W/ RETROGRADES  04/13/2021   Procedure: CYSTOSCOPY WITH RETROGRADE PYELOGRAM;  Surgeon: Abbie Sons, MD;  Location: [facility] ORS;   Service: Urology;;   CYSTOSCOPY WITH INSERTION OF UROLIFT N/A 07/27/2021   Procedure: CYSTOSCOPY WITH INSERTION OF UROLIFT;  Surgeon: Abbie Sons, MD;  Location: [facility] ORS;  Service: Urology;  Laterality: N/A;   CYSTOSCOPY/URETEROSCOPY/HOLMIUM LASER/STENT PLACEMENT Right 04/13/2021   Procedure: CYSTOSCOPY/URETEROSCOPY/HOLMIUM LASER/STENT PLACEMENT;  Surgeon: Abbie Sons, MD;  Location: [facility] ORS;  Service: Urology;  Laterality: Right;   FEMUR FRACTURE SURGERY Left 10/10/2007   FRACTURE SURGERY Left 10/10/2007   ORIF DISTAL RADIUS FRACTURE AND PERTROCHANTERIC FEMUR FRACTURE   HERNIA REPAIR Bilateral 1964   KNEE SURGERY Left 1984   torn meniscus   KNEE SURGERY Right 2005   torn meniscus   LUMBAR LAMINECTOMY/DECOMPRESSION MICRODISCECTOMY N/A 07/02/2021   Procedure: L2-S1 DECOMPRESSION;  Surgeon: Meade Maw, MD;  Location: [facility] ORS;  Service: Neurosurgery;  Laterality: N/A;   Patient Active Problem List   Diagnosis Date Noted   Status post lumbar spine operation 07/02/2021   Colon polyp 09/19/2018   Other chronic pain 07/24/2017   Pain in joint of right shoulder 05/23/2017   Hx of adenomatous colonic polyps 06/20/2016   Asthma without status asthmaticus 04/23/2014   High cholesterol 04/23/2014   Benign prostatic hyperplasia with lower urinary tract symptoms 07/15/2013   Benign neoplasm  of colon 07/15/2013   ED (erectile dysfunction) of organic origin 12/01/2011   History of elevated PSA 12/01/2011   Nephrolithiasis 12/01/2011   PIN III (prostatic intraepithelial neoplasm III) 12/01/2011    REFERRING DIAG: Imbalance  THERAPY DIAG:  Unsteadiness on feet  Other low back pain  Unspecified disturbances of skin sensation  Rationale for Evaluation and Treatment Rehabilitation  PERTINENT HISTORY: Patient is a 81 y.o. male who presents to outpatient physical therapy with a referral for medical diagnosis imbalance. This patient's chief complaints consist of paresthesia in  L > R LE that affects his balance and ambulation leading to the following functional deficits: difficulty with usual activities including sleeping, lifting, tying shoes, getting dressed, keeping balance while upright, prolonged walking on hard surface, walking without a cane, hobbies, climbing ladders (instructed not to), stairs, playing basketball, running, stopping, athletic endeavors. Relevant past medical history and comorbidities include arthritis, BPH, bursitis of hips, basal and squamous cell cancer, bilateral cataracts, heart murmur (asymptomatic), hemorrhoids, history of kidney stones, history of colon polyps, hypercholesteremia, Prostatic intraepithelial neoplasia III, spinal stenosis of lumbar region, umbilical hernia, L ankle fracture surgery (2009), right carpal tunnel release (2007), cataract extraction bilateral, cytoscopy with insertion of urolift (07/2021), ORIF DISTAL RADIUS FRACTURE AND PERTROCHANTERIC FEMUR FRACTURE (2009), hernia repair (1964), L knee surgery (torn meniscus 1984), R knee surgery (2005, torn meniscus), L2-S1 lumbar decompression including central laminectomy and bilateral medial facetectomies including foraminotomies on 07/02/2021, right shoulder pain.      PRECAUTIONS: lifted  SUBJECTIVE: Patient arrives with SPC. He denies physical pain. He missed water walking this morning because the chemicals in the pool were out of balance, so he could not get in. He got his 2x4 to do heel raises on and his step up bench to work on his strength. He states his knees were a bit sore after last PT session. His left ankle feels like it is collapsing.   PAIN:  Are you having pain? He reports no pain   OBJECTIVE  TODAY'S TREATMENT   Therapeutic exercise: to centralize symptoms and improve ROM, strength, muscular endurance, and activity tolerance required for successful completion of functional activities.  - wall sags, 2x11/9 with 5 second hold.  - step up to 6 inch step with B UE  support, 2x10 each side - heel raises with forefeet elevated on 2x4 board, 3x10 with B UE support for balance.  - seated double leg quad extension, 1x10 at 35# to warm up knee joints.  - seated single leg quad extension, R 3x10/8 L 3x10/8 on OMEGA machine, 35#.  - seated double leg hamstring curl, 1x10 each side on OMEGA machine, 35# - seated single leg hamstring curl, 2x10 each side on OMEGA machine, 3x10 each side at 30/20#.  Neuromuscular Re-education: to improve, balance, postural strength, muscle activation patterns, and stabilization strength required for functional activities: - lateral walking on 5-foot airex beam, 1x10 each direction, no UE support,  CGA.  Pt required multimodal cuing for proper technique and to facilitate improved neuromuscular control, strength, range of motion, and functional ability resulting in improved performance and form.   PATIENT EDUCATION:  Education details:  Exercise purpose/form. Self management techniques.  Person educated: Patient Education method: Explanation and Demonstration, verbal and tactile cuing.  Education comprehension: verbalized understanding, returned demonstration and needs further education     HOME EXERCISE PROGRAM: Access Code: [email or web address] URL: [email or web address] Date: 10/27/2021 Prepared by: Rosita Kea  Exercises - Tandem Stance in Corner  - 1 x daily - 3  reps - 1 min hold - Therapist, sports Feet Together With Eyes Closed  - 1 x daily - 3 reps - 1 min hold - Half Tandem Stance Balance with Head Rotation  - 1 x daily - 2-3 sets - 20 reps - [employer] on Counter  - 1 x daily - 3 sets - 10 reps - 5 seconds hold - Single-Leg Anti-Rotation Press With Anchored Resistance  - 3 x weekly - 1-2 sets - 10 reps - Heel Raises with Counter Support  - 3 x weekly - 3 sets - 10 reps - Standing Lumbar Extension at Wall - Forearms  - 1-2 x daily - 2 sets - 10 reps - 5 second hold - Forward Step Up with Counter Support  - 3 x  weekly - 2-3 sets - 10 reps   ASSESSMENT:   CLINICAL IMPRESSION: Patient arrives with report of good participation in HEP. Session continued to focus on LE strengthening and balance. Patient continues to have some unsteady on feet and would benefit from continued LE and balance training. He requires guarding in clinic for balance which is negatively affected by lack of feeling in the lower left leg. Patient would benefit from continued management of limiting condition by skilled physical therapist to address remaining impairments and functional limitations to work towards stated goals and return to PLOF or maximal functional independence.   From PT eval on 09/15/2021: Patient is a 81 y.o. male referred to outpatient physical therapy with a medical diagnosis of imbalance who presents with signs and symptoms consistent with unsteadiness on feet and difficulty walking s/p L2-S1 lumbar decompression including central laminectomy and bilateral medial facetectomies including foraminotomies on 07/02/2021. Patient has loss of sensation on L LE contributing to unsteadiness as well as altered back, hip, and knee posture and range of motion. Patient presents with significant pain, paresthesia, sensation, ROM, posture, joint stiffness, balance, gait, muscle performance (strength/power/endurance), activity tolerance, motor control, and proprioception impairments that are limiting ability to complete his usual activities including sleeping, lifting, tying shoes, getting dressed, keeping balance while upright, prolonged walking on hard surface, walking without a cane, hobbies, climbing ladders (instructed not to), stairs, playing basketball, running, stopping, athletic endeavors without difficulty. Patient will benefit from skilled physical therapy intervention to address current body structure impairments and activity limitations to improve function and work towards goals set in current POC in order to return to prior level  of function or maximal functional improvement.    OBJECTIVE IMPAIRMENTS Abnormal gait, decreased activity tolerance, decreased balance, decreased endurance, decreased knowledge of condition, decreased knowledge of use of DME, decreased mobility, difficulty walking, decreased ROM, decreased strength, hypomobility, increased fascial restrictions, impaired perceived functional ability, increased muscle spasms, impaired flexibility, impaired sensation, improper body mechanics, postural dysfunction, and pain.    ACTIVITY LIMITATIONS carrying, lifting, bending, standing, squatting, sleeping, stairs, transfers, bed mobility, dressing, and locomotion level   PARTICIPATION LIMITATIONS: interpersonal relationship, driving, community activity, yard work, and   usual activities including sleeping, lifting, tying shoes, getting dressed, keeping balance while upright, prolonged walking on hard surface, walking without a cane, hobbies, climbing ladders (instructed not to), stairs, playing basketball, running, stopping, athletic endeavors   PERSONAL FACTORS Age, Behavior pattern, Past/current experiences, Profession, Time since onset of injury/illness/exacerbation, and 3+ comorbidities:   arthritis, BPH, bursitis of hips, basal and squamous cell cancer, bilateral cataracts, heart murmur (asymptomatic), hemorrhoids, history of kidney stones, history of colon polyps, hypercholesteremia, Prostatic intraepithelial neoplasia III, spinal stenosis of lumbar region, umbilical hernia,  L ankle fracture surgery (2009), right carpal tunnel release (2007), cataract extraction bilateral, cytoscopy with insertion of urolift (07/2021), ORIF DISTAL RADIUS FRACTURE AND PERTROCHANTERIC FEMUR FRACTURE (2009), hernia repair (1964), L knee surgery (torn meniscus 1984), R knee surgery (2005, torn meniscus), L2-S1 lumbar decompression including central laminectomy and bilateral medial facetectomies including foraminotomies on 07/02/2021, right  shoulder pain are also affecting patient's functional outcome.    REHAB POTENTIAL: Good   CLINICAL DECISION MAKING: Stable/uncomplicated   EVALUATION COMPLEXITY: Low     GOALS: Goals reviewed with patient? No   SHORT TERM GOALS: Target date: 09/29/2021   Patient will be independent with initial home exercise program for self-management of symptoms. Baseline: Initial HEP to be provided at visit 2 as appropriate (09/15/21); initial HEP provided visit 2 (09/22/2021);  Goal status: In-progress     LONG TERM GOALS: Target date: 12/08/2021   Patient will be independent with a long-term home exercise program for self-management of symptoms.  Baseline: Initial HEP to be provided at visit 2 as appropriate (09/15/21); initial HEP provided visit 2 (09/22/2021); participating well (10/27/2021);  Goal status: In-progress   2.  Patient will demonstrate improved FOTO to equal or greater than 60 by visit #13 to demonstrate improvement in overall condition and self-reported functional ability.  Baseline: 56 (09/15/21); 55 at visit #5 (10/07/2021); 59 at visit # 10 (10/27/2021);  Goal status: In-progress   3.  Patient will demonstrate the ability to stand in tandem stance for equal or greater than 30 seconds with each foot forwards to demonstrate decreased fall risk.  Baseline: R = 9 seconds; L = 3 seconds (09/15/21); R = 11 seconds; L = 3 seconds (10/27/2021);  Goal status: In-progress   4.  Patient will score equal or greater than 25/30 on Functional Gait Assessment to demonstrate low fall risk (09/15/2021);  Baseline: to be tested visit 2 as appropriate (09/15/21); 20/30 moderate fall risk (09/22/2021); 22/30 (10/27/2021);  Goal status: In-progress   5.  Patient will complete community, work and/or recreational activities without limitation due to current condition.  Baseline: difficulty with usual activities including sleeping, lifting, tying shoes, getting dressed, keeping balance while upright,  prolonged walking on hard surface, walking without a cane, hobbies, climbing ladders (instructed not to), stairs, playing basketball, running, stopping, athletic endeavors (09/15/21); improved donning shoes and socks, dressing, doing more walking, still not lifting many weights but not yelling for assistance to lift a flower pot or clothes; estimates 60-70% improvement since starting PT (10/27/2021);  Goal status: In-progress   PLAN: PT FREQUENCY: 1-2x/week   PT DURATION: 12 weeks   PLANNED INTERVENTIONS: Therapeutic exercises, Therapeutic activity, Neuromuscular re-education, Balance training, Gait training, Patient/Family education, Self Care, Joint mobilization, Stair training, DME instructions, Electrical stimulation, Spinal mobilization, Cryotherapy, Moist heat, Manual therapy, and Re-evaluation.   PLAN FOR NEXT SESSION: update HEP as appropriate, balance interventions, LE and functional strengthening and ROM exercises as appropriate.   Everlean Alstrom. Graylon Good, PT, DPT 11/01/21, 12:05 PM  [facility] [address] [place], [place] [address] P: [phone] I F: [phone]

## 2021-11-03 ENCOUNTER — Encounter: Payer: Self-pay | Admitting: Physical Therapy

## 2021-11-03 ENCOUNTER — Ambulatory Visit: Payer: Medicare Other | Admitting: Physical Therapy

## 2021-11-03 DIAGNOSIS — R209 Unspecified disturbances of skin sensation: Secondary | ICD-10-CM

## 2021-11-03 DIAGNOSIS — M5459 Other low back pain: Secondary | ICD-10-CM

## 2021-11-03 DIAGNOSIS — R2681 Unsteadiness on feet: Secondary | ICD-10-CM | POA: Diagnosis not present

## 2021-11-03 NOTE — Therapy (Signed)
OUTPATIENT PHYSICAL THERAPY TREATMENT NOTE   Patient Name: Devin Landry MRN: [id number] DOB:19-Feb-1940, 81 y.o., male Today's Date: 11/03/2021  PCP: Baxter Hire, MD REFERRING PROVIDER: Meade Maw, MD  END OF SESSION:   PT End of Session - 11/03/21 1014     Visit Number 12    Number of Visits 24    Date for PT Re-Evaluation 12/08/21    Authorization Type [employer] MEDICARE reporting period from 10/27/2021    Progress Note Due on Visit 11    PT Start Time 0950    PT Stop Time 1028    PT Time Calculation (min) 38 min    Equipment Utilized During Treatment Gait belt    Activity Tolerance Patient tolerated treatment well    Behavior During Therapy [facility] for tasks assessed/performed   verbose, requiring redirection to stay on topic at times              Past Medical History:  Diagnosis Date   Allergic state    Arthritis    BPH (benign prostatic hyperplasia)    Bursitis    hips   Cancer ([place])    basal and squamous cell   Cataracts, bilateral    ED (erectile dysfunction)    Elevated PSA    Heart murmur    born with mumur-asymptomatic   Hemorrhoids    History of chicken pox    History of colon polyps    History of kidney stones    Hypercholesteremia    Hypogonadism in male    Nephrolithiasis    PIN III (prostatic intraepithelial neoplasia III)    Spinal stenosis of lumbar region    Umbilical hernia    Past Surgical History:  Procedure Laterality Date   ANKLE FRACTURE SURGERY Left 10/10/2007   CARPAL TUNNEL RELEASE Right 2007   CATARACT EXTRACTION, BILATERAL Bilateral    COLONOSCOPY     1993, 1996, 2000, 2005,2010, 2015, 2018   COLONOSCOPY WITH PROPOFOL N/A 09/21/2016   Procedure: COLONOSCOPY WITH PROPOFOL;  Surgeon: Manya Silvas, MD;  Location: [facility] ENDOSCOPY;  Service: Endoscopy;  Laterality: N/A;   CYSTOSCOPY W/ RETROGRADES  04/13/2021   Procedure: CYSTOSCOPY WITH RETROGRADE PYELOGRAM;  Surgeon: Abbie Sons, MD;  Location: [facility] ORS;   Service: Urology;;   CYSTOSCOPY WITH INSERTION OF UROLIFT N/A 07/27/2021   Procedure: CYSTOSCOPY WITH INSERTION OF UROLIFT;  Surgeon: Abbie Sons, MD;  Location: [facility] ORS;  Service: Urology;  Laterality: N/A;   CYSTOSCOPY/URETEROSCOPY/HOLMIUM LASER/STENT PLACEMENT Right 04/13/2021   Procedure: CYSTOSCOPY/URETEROSCOPY/HOLMIUM LASER/STENT PLACEMENT;  Surgeon: Abbie Sons, MD;  Location: [facility] ORS;  Service: Urology;  Laterality: Right;   FEMUR FRACTURE SURGERY Left 10/10/2007   FRACTURE SURGERY Left 10/10/2007   ORIF DISTAL RADIUS FRACTURE AND PERTROCHANTERIC FEMUR FRACTURE   HERNIA REPAIR Bilateral 1964   KNEE SURGERY Left 1984   torn meniscus   KNEE SURGERY Right 2005   torn meniscus   LUMBAR LAMINECTOMY/DECOMPRESSION MICRODISCECTOMY N/A 07/02/2021   Procedure: L2-S1 DECOMPRESSION;  Surgeon: Meade Maw, MD;  Location: [facility] ORS;  Service: Neurosurgery;  Laterality: N/A;   Patient Active Problem List   Diagnosis Date Noted   Status post lumbar spine operation 07/02/2021   Colon polyp 09/19/2018   Other chronic pain 07/24/2017   Pain in joint of right shoulder 05/23/2017   Hx of adenomatous colonic polyps 06/20/2016   Asthma without status asthmaticus 04/23/2014   High cholesterol 04/23/2014   Benign prostatic hyperplasia with lower urinary tract symptoms 07/15/2013   Benign  neoplasm of colon 07/15/2013   ED (erectile dysfunction) of organic origin 12/01/2011   History of elevated PSA 12/01/2011   Nephrolithiasis 12/01/2011   PIN III (prostatic intraepithelial neoplasm III) 12/01/2011    REFERRING DIAG: Imbalance  THERAPY DIAG:  Unsteadiness on feet  Other low back pain  Unspecified disturbances of skin sensation  Rationale for Evaluation and Treatment Rehabilitation  PERTINENT HISTORY: Patient is a 81 y.o. male who presents to outpatient physical therapy with a referral for medical diagnosis imbalance. This patient's chief complaints consist of paresthesia in  L > R LE that affects his balance and ambulation leading to the following functional deficits: difficulty with usual activities including sleeping, lifting, tying shoes, getting dressed, keeping balance while upright, prolonged walking on hard surface, walking without a cane, hobbies, climbing ladders (instructed not to), stairs, playing basketball, running, stopping, athletic endeavors. Relevant past medical history and comorbidities include arthritis, BPH, bursitis of hips, basal and squamous cell cancer, bilateral cataracts, heart murmur (asymptomatic), hemorrhoids, history of kidney stones, history of colon polyps, hypercholesteremia, Prostatic intraepithelial neoplasia III, spinal stenosis of lumbar region, umbilical hernia, L ankle fracture surgery (2009), right carpal tunnel release (2007), cataract extraction bilateral, cytoscopy with insertion of urolift (07/2021), ORIF DISTAL RADIUS FRACTURE AND PERTROCHANTERIC FEMUR FRACTURE (2009), hernia repair (1964), L knee surgery (torn meniscus 1984), R knee surgery (2005, torn meniscus), L2-S1 lumbar decompression including central laminectomy and bilateral medial facetectomies including foraminotomies on 07/02/2021, right shoulder pain.      PRECAUTIONS: lifted  SUBJECTIVE: Patient arrives with SPC. He states his knees were a bit more sore after last PT session until mid-day yesterday. He reports no pain currently. He arrives with [facility] but forgets it at first in the waiting room. States he did not do as much HEP because his knees were hurting.   PAIN:  Are you having pain? He reports no pain   OBJECTIVE  TODAY'S TREATMENT   Therapeutic exercise: to centralize symptoms and improve ROM, strength, muscular endurance, and activity tolerance required for successful completion of functional activities.  - ambulation over ground with 7.5# AW on each ankle, cuing for increased step length, height, and cadence. CGA. 1x200 feet, 2x300 feet with CGA.  - sit <>  stand with ball slam/retrieval from floor, 1x10 with 15# slam ball.  - wall sags, 2x11/9 with 5 second hold.  - heel raises with forefeet elevated on 2x4 board, 3x15-17 with B UE support for balance.   Neuromuscular Re-education: to improve, balance, postural strength, muscle activation patterns, and stabilization strength required for functional activities: - lateral walking on 5-foot airex beam, 1x10 each direction, no UE support (but it is available),  CGA. - staggered stance lumbar rotation with BlackTB with slight scoop, CGA. To improve balance and core control.   Pt required multimodal cuing for proper technique and to facilitate improved neuromuscular control, strength, range of motion, and functional ability resulting in improved performance and form.   PATIENT EDUCATION:  Education details:  Exercise purpose/form. Self management techniques.  Person educated: Patient Education method: Explanation and Demonstration, verbal and tactile cuing.  Education comprehension: verbalized understanding, returned demonstration and needs further education     HOME EXERCISE PROGRAM: Access Code: [email or web address] URL: [email or web address] Date: 10/27/2021 Prepared by: Rosita Kea  Exercises - Tandem Stance in Corner  - 1 x daily - 3 reps - 1 min hold - Corner Balance Feet Together With Eyes Closed  - 1 x daily - 3 reps - 1 min hold - Half Tandem  Stance Balance with Head Rotation  - 1 x daily - 2-3 sets - 20 reps - [employer] on Counter  - 1 x daily - 3 sets - 10 reps - 5 seconds hold - Single-Leg Anti-Rotation Press With Anchored Resistance  - 3 x weekly - 1-2 sets - 10 reps - Heel Raises with Counter Support  - 3 x weekly - 3 sets - 10 reps - Standing Lumbar Extension at Wall - Forearms  - 1-2 x daily - 2 sets - 10 reps - 5 second hold - Forward Step Up with Counter Support  - 3 x weekly - 2-3 sets - 10 reps   ASSESSMENT:   CLINICAL IMPRESSION: Patient tolerated treatment  well overall and reported no increase in pain by end of session. He did have one small sensation of R knee giving way while performing side stepping on the airex pad. He continues to struggle with forward flexed position and some unsteadiness on his feet but has no falls and is able to correct LOB with step strategy. He demonstrates good ability to perform exercises and appears to be nearing readiness to discharge to independent management with HEP. Patient would benefit from continued management of limiting condition by skilled physical therapist to address remaining impairments and functional limitations to work towards stated goals and return to PLOF or maximal functional independence.   From PT eval on 09/15/2021: Patient is a 81 y.o. male referred to outpatient physical therapy with a medical diagnosis of imbalance who presents with signs and symptoms consistent with unsteadiness on feet and difficulty walking s/p L2-S1 lumbar decompression including central laminectomy and bilateral medial facetectomies including foraminotomies on 07/02/2021. Patient has loss of sensation on L LE contributing to unsteadiness as well as altered back, hip, and knee posture and range of motion. Patient presents with significant pain, paresthesia, sensation, ROM, posture, joint stiffness, balance, gait, muscle performance (strength/power/endurance), activity tolerance, motor control, and proprioception impairments that are limiting ability to complete his usual activities including sleeping, lifting, tying shoes, getting dressed, keeping balance while upright, prolonged walking on hard surface, walking without a cane, hobbies, climbing ladders (instructed not to), stairs, playing basketball, running, stopping, athletic endeavors without difficulty. Patient will benefit from skilled physical therapy intervention to address current body structure impairments and activity limitations to improve function and work towards goals set in  current POC in order to return to prior level of function or maximal functional improvement.    OBJECTIVE IMPAIRMENTS Abnormal gait, decreased activity tolerance, decreased balance, decreased endurance, decreased knowledge of condition, decreased knowledge of use of DME, decreased mobility, difficulty walking, decreased ROM, decreased strength, hypomobility, increased fascial restrictions, impaired perceived functional ability, increased muscle spasms, impaired flexibility, impaired sensation, improper body mechanics, postural dysfunction, and pain.    ACTIVITY LIMITATIONS carrying, lifting, bending, standing, squatting, sleeping, stairs, transfers, bed mobility, dressing, and locomotion level   PARTICIPATION LIMITATIONS: interpersonal relationship, driving, community activity, yard work, and   usual activities including sleeping, lifting, tying shoes, getting dressed, keeping balance while upright, prolonged walking on hard surface, walking without a cane, hobbies, climbing ladders (instructed not to), stairs, playing basketball, running, stopping, athletic endeavors   PERSONAL FACTORS Age, Behavior pattern, Past/current experiences, Profession, Time since onset of injury/illness/exacerbation, and 3+ comorbidities:   arthritis, BPH, bursitis of hips, basal and squamous cell cancer, bilateral cataracts, heart murmur (asymptomatic), hemorrhoids, history of kidney stones, history of colon polyps, hypercholesteremia, Prostatic intraepithelial neoplasia III, spinal stenosis of lumbar region, umbilical hernia, L ankle  fracture surgery (2009), right carpal tunnel release (2007), cataract extraction bilateral, cytoscopy with insertion of urolift (07/2021), ORIF DISTAL RADIUS FRACTURE AND PERTROCHANTERIC FEMUR FRACTURE (2009), hernia repair (1964), L knee surgery (torn meniscus 1984), R knee surgery (2005, torn meniscus), L2-S1 lumbar decompression including central laminectomy and bilateral medial facetectomies  including foraminotomies on 07/02/2021, right shoulder pain are also affecting patient's functional outcome.    REHAB POTENTIAL: Good   CLINICAL DECISION MAKING: Stable/uncomplicated   EVALUATION COMPLEXITY: Low     GOALS: Goals reviewed with patient? No   SHORT TERM GOALS: Target date: 09/29/2021   Patient will be independent with initial home exercise program for self-management of symptoms. Baseline: Initial HEP to be provided at visit 2 as appropriate (09/15/21); initial HEP provided visit 2 (09/22/2021);  Goal status: In-progress     LONG TERM GOALS: Target date: 12/08/2021   Patient will be independent with a long-term home exercise program for self-management of symptoms.  Baseline: Initial HEP to be provided at visit 2 as appropriate (09/15/21); initial HEP provided visit 2 (09/22/2021); participating well (10/27/2021);  Goal status: In-progress   2.  Patient will demonstrate improved FOTO to equal or greater than 60 by visit #13 to demonstrate improvement in overall condition and self-reported functional ability.  Baseline: 56 (09/15/21); 55 at visit #5 (10/07/2021); 59 at visit # 10 (10/27/2021);  Goal status: In-progress   3.  Patient will demonstrate the ability to stand in tandem stance for equal or greater than 30 seconds with each foot forwards to demonstrate decreased fall risk.  Baseline: R = 9 seconds; L = 3 seconds (09/15/21); R = 11 seconds; L = 3 seconds (10/27/2021);  Goal status: In-progress   4.  Patient will score equal or greater than 25/30 on Functional Gait Assessment to demonstrate low fall risk (09/15/2021);  Baseline: to be tested visit 2 as appropriate (09/15/21); 20/30 moderate fall risk (09/22/2021); 22/30 (10/27/2021);  Goal status: In-progress   5.  Patient will complete community, work and/or recreational activities without limitation due to current condition.  Baseline: difficulty with usual activities including sleeping, lifting, tying shoes,  getting dressed, keeping balance while upright, prolonged walking on hard surface, walking without a cane, hobbies, climbing ladders (instructed not to), stairs, playing basketball, running, stopping, athletic endeavors (09/15/21); improved donning shoes and socks, dressing, doing more walking, still not lifting many weights but not yelling for assistance to lift a flower pot or clothes; estimates 60-70% improvement since starting PT (10/27/2021);  Goal status: In-progress   PLAN: PT FREQUENCY: 1-2x/week   PT DURATION: 12 weeks   PLANNED INTERVENTIONS: Therapeutic exercises, Therapeutic activity, Neuromuscular re-education, Balance training, Gait training, Patient/Family education, Self Care, Joint mobilization, Stair training, DME instructions, Electrical stimulation, Spinal mobilization, Cryotherapy, Moist heat, Manual therapy, and Re-evaluation.   PLAN FOR NEXT SESSION: update HEP as appropriate, balance interventions, LE and functional strengthening and ROM exercises as appropriate.   Everlean Alstrom. Graylon Good, PT, DPT 11/03/21, 10:49 AM  [facility] [address] [place], [place] [address] P: [phone] I F: [phone]

## 2021-11-08 ENCOUNTER — Ambulatory Visit: Payer: Medicare Other | Admitting: Physical Therapy

## 2021-11-08 ENCOUNTER — Encounter: Payer: Self-pay | Admitting: Physical Therapy

## 2021-11-08 DIAGNOSIS — M5459 Other low back pain: Secondary | ICD-10-CM

## 2021-11-08 DIAGNOSIS — R209 Unspecified disturbances of skin sensation: Secondary | ICD-10-CM

## 2021-11-08 DIAGNOSIS — R2681 Unsteadiness on feet: Secondary | ICD-10-CM

## 2021-11-08 NOTE — Therapy (Signed)
OUTPATIENT PHYSICAL THERAPY TREATMENT NOTE and DISCHARGE SUMMARY Dates of reporting form 09/15/2021 to 11/08/2021   Patient Name: Devin Landry MRN: [id number] DOB:1940/10/04, 81 y.o., male Today's Date: 11/08/2021  PCP: Baxter Hire, MD REFERRING PROVIDER: Meade Maw, MD  END OF SESSION:   PT End of Session - 11/08/21 1407     Visit Number 13    Number of Visits 24    Date for PT Re-Evaluation 12/08/21    Authorization Type [employer] MEDICARE reporting period from 10/27/2021    Progress Note Due on Visit 20    PT Start Time 1350    PT Stop Time 1430    PT Time Calculation (min) 40 min    Equipment Utilized During Treatment Gait belt    Activity Tolerance Patient tolerated treatment well    Behavior During Therapy [facility] for tasks assessed/performed   verbose, requiring redirection to stay on topic at times               Past Medical History:  Diagnosis Date   Allergic state    Arthritis    BPH (benign prostatic hyperplasia)    Bursitis    hips   Cancer ([place])    basal and squamous cell   Cataracts, bilateral    ED (erectile dysfunction)    Elevated PSA    Heart murmur    born with mumur-asymptomatic   Hemorrhoids    History of chicken pox    History of colon polyps    History of kidney stones    Hypercholesteremia    Hypogonadism in male    Nephrolithiasis    PIN III (prostatic intraepithelial neoplasia III)    Spinal stenosis of lumbar region    Umbilical hernia    Past Surgical History:  Procedure Laterality Date   ANKLE FRACTURE SURGERY Left 10/10/2007   CARPAL TUNNEL RELEASE Right 2007   CATARACT EXTRACTION, BILATERAL Bilateral    COLONOSCOPY     1993, 1996, 2000, 2005,2010, 2015, 2018   COLONOSCOPY WITH PROPOFOL N/A 09/21/2016   Procedure: COLONOSCOPY WITH PROPOFOL;  Surgeon: Manya Silvas, MD;  Location: [facility] ENDOSCOPY;  Service: Endoscopy;  Laterality: N/A;   CYSTOSCOPY W/ RETROGRADES  04/13/2021   Procedure: CYSTOSCOPY WITH  RETROGRADE PYELOGRAM;  Surgeon: Abbie Sons, MD;  Location: [facility] ORS;  Service: Urology;;   CYSTOSCOPY WITH INSERTION OF UROLIFT N/A 07/27/2021   Procedure: CYSTOSCOPY WITH INSERTION OF UROLIFT;  Surgeon: Abbie Sons, MD;  Location: [facility] ORS;  Service: Urology;  Laterality: N/A;   CYSTOSCOPY/URETEROSCOPY/HOLMIUM LASER/STENT PLACEMENT Right 04/13/2021   Procedure: CYSTOSCOPY/URETEROSCOPY/HOLMIUM LASER/STENT PLACEMENT;  Surgeon: Abbie Sons, MD;  Location: [facility] ORS;  Service: Urology;  Laterality: Right;   FEMUR FRACTURE SURGERY Left 10/10/2007   FRACTURE SURGERY Left 10/10/2007   ORIF DISTAL RADIUS FRACTURE AND PERTROCHANTERIC FEMUR FRACTURE   HERNIA REPAIR Bilateral 1964   KNEE SURGERY Left 1984   torn meniscus   KNEE SURGERY Right 2005   torn meniscus   LUMBAR LAMINECTOMY/DECOMPRESSION MICRODISCECTOMY N/A 07/02/2021   Procedure: L2-S1 DECOMPRESSION;  Surgeon: Meade Maw, MD;  Location: [facility] ORS;  Service: Neurosurgery;  Laterality: N/A;   Patient Active Problem List   Diagnosis Date Noted   Status post lumbar spine operation 07/02/2021   Colon polyp 09/19/2018   Other chronic pain 07/24/2017   Pain in joint of right shoulder 05/23/2017   Hx of adenomatous colonic polyps 06/20/2016   Asthma without status asthmaticus 04/23/2014   High cholesterol 04/23/2014   Benign  prostatic hyperplasia with lower urinary tract symptoms 07/15/2013   Benign neoplasm of colon 07/15/2013   ED (erectile dysfunction) of organic origin 12/01/2011   History of elevated PSA 12/01/2011   Nephrolithiasis 12/01/2011   PIN III (prostatic intraepithelial neoplasm III) 12/01/2011    REFERRING DIAG: Imbalance  THERAPY DIAG:  Unsteadiness on feet  Other low back pain  Unspecified disturbances of skin sensation  Rationale for Evaluation and Treatment Rehabilitation  PERTINENT HISTORY: Patient is a 81 y.o. male who presents to outpatient physical therapy with a referral for medical  diagnosis imbalance. This patient's chief complaints consist of paresthesia in L > R LE that affects his balance and ambulation leading to the following functional deficits: difficulty with usual activities including sleeping, lifting, tying shoes, getting dressed, keeping balance while upright, prolonged walking on hard surface, walking without a cane, hobbies, climbing ladders (instructed not to), stairs, playing basketball, running, stopping, athletic endeavors. Relevant past medical history and comorbidities include arthritis, BPH, bursitis of hips, basal and squamous cell cancer, bilateral cataracts, heart murmur (asymptomatic), hemorrhoids, history of kidney stones, history of colon polyps, hypercholesteremia, Prostatic intraepithelial neoplasia III, spinal stenosis of lumbar region, umbilical hernia, L ankle fracture surgery (2009), right carpal tunnel release (2007), cataract extraction bilateral, cytoscopy with insertion of urolift (07/2021), ORIF DISTAL RADIUS FRACTURE AND PERTROCHANTERIC FEMUR FRACTURE (2009), hernia repair (1964), L knee surgery (torn meniscus 1984), R knee surgery (2005, torn meniscus), L2-S1 lumbar decompression including central laminectomy and bilateral medial facetectomies including foraminotomies on 07/02/2021, right shoulder pain.      PRECAUTIONS: lifted  SUBJECTIVE: Patient arrives with SPC. He states he did his water walking this morning at the pool. He also did a lot of yard work over the weekend. He had a little trouble with his balance when picking up limbs on the side of a hill. He is getting an area built to make a home gym. HE feels ready to discharge to long term HEP today. He did have some questions about his HEP and the frequency that he is to do them.   PAIN:  Are you having pain? He reports no pain   OBJECTIVE  SELF-REPORTED FUNCTION FOTO score: 62/100 (balance questionnaire)   Sharpened Romberg test: -Tandem stance, eyes open (best of 3, toe 1 inch  lateral): R = 37 seconds; L = 32 seconds.    TODAY'S TREATMENT   Therapeutic exercise: to centralize symptoms and improve ROM, strength, muscular endurance, and activity tolerance required for successful completion of functional activities.  - tandem stance to assess progress (see above).  - seated double leg quad extension, 1x17 at 35# to warm up knee joints.  - seated single leg quad extension, R 2x8/8 L 2x9/7 on OMEGA machine, 35#.  - seated double leg hamstring curl, 1x10 on OMEGA machine, 35# - seated single leg hamstring curl, 1x9 on R and 1x8 on left at [facility] machine.  - standing wall sags, 2x10 with 5 second holds. Cuing for UE position.   Pt required multimodal cuing for proper technique and to facilitate improved neuromuscular control, strength, range of motion, and functional ability resulting in improved performance and form.   PATIENT EDUCATION:  Education details:  Exercise purpose/form. Self management techniques.  Person educated: Patient Education method: Explanation and Demonstration, verbal and tactile cuing.  Education comprehension: verbalized understanding, returned demonstration and needs further education     HOME EXERCISE PROGRAM: Access Code: [email or web address] URL: [email or web address] Date: 11/08/2021 Prepared by: Rosita Kea  Exercises - Standing Lumbar Extension  at [place]  - 2 x daily - 2 sets - 10 reps - 5 second hold - Tandem Stance in Corner  - 1 x daily - 3 reps - 1 min hold - Corner Balance Feet Together With Eyes Closed  - 1 x daily - 3 reps - 1 min hold - Half Tandem Stance Balance with Head Rotation  - 1 x daily - 2-3 sets - 20 reps - [employer] on Counter  - 3 x weekly - 3 sets - 10 reps - 5 seconds hold - Single-Leg Anti-Rotation Press With Anchored Resistance  - 3 x weekly - 2-3 sets - 10 reps - Heel Raises with Counter Support  - 3 x weekly - 3 sets - 10 reps - Forward Step Up with Counter Support  - 3 x weekly - 2-3  sets - 10 reps   ASSESSMENT:   CLINICAL IMPRESSION: Patient has attended 13 physical therapy appointments since starting this episode of care on 09/15/2021. He has met or nearly met all of his goals and feels confident in discharging to long term HEP. Therefore, patient is now discharged from PT due to satisfactory realistic improvement in his condition. Patient does have remaining deficits that affect his daily life but will be best addressed through long term home exercise program.     OBJECTIVE IMPAIRMENTS Abnormal gait, decreased activity tolerance, decreased balance, decreased endurance, decreased knowledge of condition, decreased knowledge of use of DME, decreased mobility, difficulty walking, decreased ROM, decreased strength, hypomobility, increased fascial restrictions, impaired perceived functional ability, increased muscle spasms, impaired flexibility, impaired sensation, improper body mechanics, postural dysfunction, and pain.    ACTIVITY LIMITATIONS carrying, lifting, bending, standing, squatting, sleeping, stairs, transfers, bed mobility, dressing, and locomotion level   PARTICIPATION LIMITATIONS: interpersonal relationship, driving, community activity, yard work, and   usual activities including sleeping, lifting, tying shoes, getting dressed, keeping balance while upright, prolonged walking on hard surface, walking without a cane, hobbies, climbing ladders (instructed not to), stairs, playing basketball, running, stopping, athletic endeavors   PERSONAL FACTORS Age, Behavior pattern, Past/current experiences, Profession, Time since onset of injury/illness/exacerbation, and 3+ comorbidities:   arthritis, BPH, bursitis of hips, basal and squamous cell cancer, bilateral cataracts, heart murmur (asymptomatic), hemorrhoids, history of kidney stones, history of colon polyps, hypercholesteremia, Prostatic intraepithelial neoplasia III, spinal stenosis of lumbar region, umbilical hernia, L ankle  fracture surgery (2009), right carpal tunnel release (2007), cataract extraction bilateral, cytoscopy with insertion of urolift (07/2021), ORIF DISTAL RADIUS FRACTURE AND PERTROCHANTERIC FEMUR FRACTURE (2009), hernia repair (1964), L knee surgery (torn meniscus 1984), R knee surgery (2005, torn meniscus), L2-S1 lumbar decompression including central laminectomy and bilateral medial facetectomies including foraminotomies on 07/02/2021, right shoulder pain are also affecting patient's functional outcome.    REHAB POTENTIAL: Good   CLINICAL DECISION MAKING: Stable/uncomplicated   EVALUATION COMPLEXITY: Low     GOALS: Goals reviewed with patient? No   SHORT TERM GOALS: Target date: 09/29/2021   Patient will be independent with initial home exercise program for self-management of symptoms. Baseline: Initial HEP to be provided at visit 2 as appropriate (09/15/21); initial HEP provided visit 2 (09/22/2021);  Goal status: MET     LONG TERM GOALS: Target date: 12/08/2021   Patient will be independent with a long-term home exercise program for self-management of symptoms.  Baseline: Initial HEP to be provided at visit 2 as appropriate (09/15/21); initial HEP provided visit 2 (09/22/2021); participating well (10/27/2021); MET (11/08/2021);  Goal status: MET  2.  Patient will demonstrate improved FOTO to equal or greater than 60 by visit #13 to demonstrate improvement in overall condition and self-reported functional ability.  Baseline: 56 (09/15/21); 55 at visit #5 (10/07/2021); 59 at visit # 10 (10/27/2021); 62 at visit #13 (11/08/2021);  Goal status: MET   3.  Patient will demonstrate the ability to stand in tandem stance for equal or greater than 30 seconds with each foot forwards to demonstrate decreased fall risk.  Baseline: R = 9 seconds; L = 3 seconds (09/15/21); R = 11 seconds; L = 3 seconds (10/27/2021); (best of 3, toe 1 inch lateral): R = 37 seconds; L = 32 seconds (11/08/2021);  Goal  status: Nearly met   4.  Patient will score equal or greater than 25/30 on Functional Gait Assessment to demonstrate low fall risk (09/15/2021);  Baseline: to be tested visit 2 as appropriate (09/15/21); 20/30 moderate fall risk (09/22/2021); 22/30 (10/27/2021);  Goal status: Partially met   5.  Patient will complete community, work and/or recreational activities without limitation due to current condition.  Baseline: difficulty with usual activities including sleeping, lifting, tying shoes, getting dressed, keeping balance while upright, prolonged walking on hard surface, walking without a cane, hobbies, climbing ladders (instructed not to), stairs, playing basketball, running, stopping, athletic endeavors (09/15/21); improved donning shoes and socks, dressing, doing more walking, still not lifting many weights but not yelling for assistance to lift a flower pot or clothes; estimates 60-70% improvement since starting PT (10/27/2021);  > 50% and 100% in feeling like he is going to do what he needs to (11/08/2021);  Goal status: Nearly met   PLAN: PT FREQUENCY: 1-2x/week   PT DURATION: 12 weeks   PLANNED INTERVENTIONS: Therapeutic exercises, Therapeutic activity, Neuromuscular re-education, Balance training, Gait training, Patient/Family education, Self Care, Joint mobilization, Stair training, DME instructions, Electrical stimulation, Spinal mobilization, Cryotherapy, Moist heat, Manual therapy, and Re-evaluation.   PLAN FOR NEXT SESSION: update HEP as appropriate, balance interventions, LE and functional strengthening and ROM exercises as appropriate.   Everlean Alstrom. Graylon Good, PT, DPT 11/08/21, 2:30 PM  [facility] [address] [place], [place] [address] P: [phone] I F: [phone]

## 2022-04-25 ENCOUNTER — Ambulatory Visit
Admission: RE | Admit: 2022-04-25 | Discharge: 2022-04-25 | Disposition: A | Discharge status: Home / Self Care | Payer: Medicare HMO | Source: Ambulatory Visit | Attending: Urology | Admitting: Urology

## 2022-04-25 ENCOUNTER — Ambulatory Visit
Admission: RE | Admit: 2022-04-25 | Discharge: 2022-04-25 | Disposition: A | Discharge status: Home / Self Care | Payer: Medicare HMO | Attending: Urology | Admitting: Urology

## 2022-04-25 ENCOUNTER — Ambulatory Visit: Payer: Medicare HMO | Admitting: Urology

## 2022-04-25 ENCOUNTER — Encounter: Payer: Self-pay | Admitting: Urology

## 2022-04-25 VITALS — BP 137/79 | HR 105 | Ht 72.0 in | Wt 194.0 lb

## 2022-04-25 DIAGNOSIS — N401 Enlarged prostate with lower urinary tract symptoms: Secondary | ICD-10-CM

## 2022-04-25 DIAGNOSIS — N200 Calculus of kidney: Secondary | ICD-10-CM

## 2022-04-25 LAB — BLADDER SCAN AMB NON-IMAGING: Scan Result: 6

## 2022-04-25 NOTE — Progress Notes (Signed)
I, DeAsia L Maxie,acting as a scribe for Riki Altes, MD.,have documented all relevant documentation on the behalf of Riki Altes, MD,as directed by  Riki Altes, MD while in the presence of Riki Altes, MD.   I, Maysun L Gibbs,acting as a scribe for Riki Altes, MD.,have documented all relevant documentation on the behalf of Riki Altes, MD,as directed by  Riki Altes, MD while in the presence of Riki Altes, MD.   04/25/2022 9:53 AM   Devin Landry [birth year] [id number]  Referring provider: Gracelyn Nurse, MD [address] [place],  [place] [address]  Chief Complaint  Patient presents with   Benign Prostatic Hypertrophy   Urologic History  1. Bilateral nephrolithiasis Bilateral non-obstructing renal calculi Right ureteroscopy 04/2021 for a ureteral calculus which was noted passed interoperatively. He underwent treatment of his right renal calculi.  2.  BPH with urinary retention Urinary retention after lumbar spine surgery. He passed voiding trial, however, had incomplete emptying with residuals, around 100 mL.  UroLift 07/27/21 7 implants delivered  HPI: Devin Landry is a 82 y.o. male presenting today for a 6 month follow up visit.  Doing well with no bothersome LUTS He gets up once per night to void IPSS today 1/35 No flank, abdominal, or pelvic pain. KUB shows small calcifications overlying the renal outlines bilaterally.  PMH: Past Medical History:  Diagnosis Date   Allergic state    Arthritis    BPH (benign prostatic hyperplasia)    Bursitis    hips   Cancer    basal and squamous cell   Cataracts, bilateral    ED (erectile dysfunction)    Elevated PSA    Heart murmur    born with mumur-asymptomatic   Hemorrhoids    History of chicken pox    History of colon polyps    History of kidney stones    Hypercholesteremia    Hypogonadism in male    Nephrolithiasis    PIN III (prostatic intraepithelial neoplasia III)     Spinal stenosis of lumbar region    Umbilical hernia     Surgical History: Past Surgical History:  Procedure Laterality Date   ANKLE FRACTURE SURGERY Left 10/10/2007   CARPAL TUNNEL RELEASE Right 2007   CATARACT EXTRACTION, BILATERAL Bilateral    COLONOSCOPY     1993, 1996, 2000, 2005,2010, 2015, 2018   COLONOSCOPY WITH PROPOFOL N/A 09/21/2016   Procedure: COLONOSCOPY WITH PROPOFOL;  Surgeon: Scot Jun, MD;  Location: [facility] ENDOSCOPY;  Service: Endoscopy;  Laterality: N/A;   CYSTOSCOPY W/ RETROGRADES  04/13/2021   Procedure: CYSTOSCOPY WITH RETROGRADE PYELOGRAM;  Surgeon: Riki Altes, MD;  Location: [facility] ORS;  Service: Urology;;   CYSTOSCOPY WITH INSERTION OF UROLIFT N/A 07/27/2021   Procedure: CYSTOSCOPY WITH INSERTION OF UROLIFT;  Surgeon: Riki Altes, MD;  Location: [facility] ORS;  Service: Urology;  Laterality: N/A;   CYSTOSCOPY/URETEROSCOPY/HOLMIUM LASER/STENT PLACEMENT Right 04/13/2021   Procedure: CYSTOSCOPY/URETEROSCOPY/HOLMIUM LASER/STENT PLACEMENT;  Surgeon: Riki Altes, MD;  Location: [facility] ORS;  Service: Urology;  Laterality: Right;   FEMUR FRACTURE SURGERY Left 10/10/2007   FRACTURE SURGERY Left 10/10/2007   ORIF DISTAL RADIUS FRACTURE AND PERTROCHANTERIC FEMUR FRACTURE   HERNIA REPAIR Bilateral 1964   KNEE SURGERY Left 1984   torn meniscus   KNEE SURGERY Right 2005   torn meniscus   LUMBAR LAMINECTOMY/DECOMPRESSION MICRODISCECTOMY N/A 07/02/2021   Procedure: L2-S1 DECOMPRESSION;  Surgeon: Venetia Night, MD;  Location: [facility]  ORS;  Service: Neurosurgery;  Laterality: N/A;    Home Medications:  Allergies as of 04/25/2022       Reactions   Food Color Red Anaphylaxis   Red delicious apple skins   Other    Red Delicious Apple skins causes anaphylaxis    Soy Allergy Other (See Comments)   Headache Other reaction(s): Headache   Zocor [simvastatin] Other (See Comments)   Leg pain        Medication List        Accurate as of April 25, 2022   9:53 AM. If you have any questions, ask your nurse or doctor.          STOP taking these medications    Turmeric 500 MG Caps Stopped by: Riki Altes, MD       TAKE these medications    acetaminophen 650 MG CR tablet Commonly known as: TYLENOL Take 650 mg by mouth 2 (two) times daily.   B-12 PO Take 1 tablet by mouth 4 (four) times a week.   CALCIUM CITRATE + D PO Take 1 tablet by mouth 2 (two) times daily. Calcium citrate 500 mg Vitamin D3 400 IU   cholestyramine 4 GM/DOSE powder Commonly known as: QUESTRAN Take 4 g by mouth daily as needed (diarrhea).   CLEAR FIBER POWDER PO Take 1 Dose by mouth daily as needed.   diphenhydramine-acetaminophen 25-500 MG Tabs tablet Commonly known as: TYLENOL PM Take 1 tablet by mouth at bedtime as needed.   docusate sodium 100 MG capsule Commonly known as: COLACE Take 100 mg by mouth 2 (two) times daily.   EQL Vitamin D3 25 MCG (1000 UT) capsule Generic drug: Cholecalciferol Take 1,000 Units by mouth daily as needed.   finasteride 5 MG tablet Commonly known as: PROSCAR TAKE 1 TABLET BY MOUTH DAILY   gabapentin 300 MG capsule Commonly known as: NEURONTIN Take 1 capsule by mouth at bedtime.   gabapentin 100 MG capsule Commonly known as: NEURONTIN Take 200 mg by mouth daily.  in the morning   GLUCOSAMINE 1500 COMPLEX PO Take 1,500 mg by mouth 2 (two) times daily.   methocarbamol 500 MG tablet Commonly known as: ROBAXIN Take 1 tablet (500 mg total) by mouth every 8 (eight) hours as needed for muscle spasms.   multivitamin tablet Take 1 tablet by mouth daily.   naproxen sodium 220 MG tablet Commonly known as: ALEVE Take 220 mg by mouth daily.   oxymetazoline 0.05 % nasal spray Commonly known as: AFRIN Place 1 spray into both nostrils 2 (two) times daily as needed for congestion.   rosuvastatin 5 MG tablet Commonly known as: CRESTOR Take 5 mg by mouth every evening.   Uribel 118 MG Caps Take 1  capsule (118 mg total) by mouth 3 (three) times daily as needed (Urinary frequency, urgency, burning).        Allergies:  Allergies  Allergen Reactions   Food Color Red Anaphylaxis    Red delicious apple skins   Other     Red Delicious Apple skins causes anaphylaxis     Soy Allergy Other (See Comments)    Headache Other reaction(s): Headache   Zocor [Simvastatin] Other (See Comments)    Leg pain    Social History:  reports that he quit smoking about 43 years ago. His smoking use included pipe. He has never used smokeless tobacco. He reports that he does not drink alcohol and does not use drugs.   Physical Exam: BP 137/79  Pulse (!) 105   Ht 6' (1.829 m)   Wt 194 lb (88 kg)   BMI 26.31 kg/m   Constitutional:  Alert and oriented, No acute distress. HEENT: [place] AT Respiratory: Normal respiratory effort, no increased work of breathing. Neurologic: Grossly intact, no focal deficits, moving all 4 extremities. Psychiatric: Normal mood and affect.   Pertinent Imaging: KUB today was personally reviewed and interpreted. KUB shows small calcifications overlying the renal outlines bilaterally.   Assessment & Plan:    BPH without LUTS Doing well status post UroLift PVR 6 mL  follow up 1 year with PVR.  Bilateral nephrolithiasis Stable  1 year follow up with KUB  I have reviewed the above documentation for accuracy and completeness, and I agree with the above.   Riki Altes, MD  [facility] [address], [address] [place], [place] [address] [phone]

## 2022-07-02 ENCOUNTER — Other Ambulatory Visit: Payer: Self-pay | Admitting: Neurosurgery

## 2022-07-04 NOTE — Telephone Encounter (Signed)
Refill request for neurontin. He was last seen in September and was to f/u prn.   Please let him know that he will need to get his neurontin from his PCP.   I denied the prescription.

## 2022-10-05 ENCOUNTER — Other Ambulatory Visit: Payer: Self-pay | Admitting: Urology

## 2022-10-05 DIAGNOSIS — N401 Enlarged prostate with lower urinary tract symptoms: Secondary | ICD-10-CM

## 2023-04-21 ENCOUNTER — Ambulatory Visit
Admission: RE | Admit: 2023-04-21 | Discharge: 2023-04-21 | Disposition: A | Discharge status: Home / Self Care | Attending: Urology | Admitting: Urology

## 2023-04-21 ENCOUNTER — Ambulatory Visit: Payer: Self-pay | Admitting: Urology

## 2023-04-21 ENCOUNTER — Encounter: Payer: Self-pay | Admitting: Urology

## 2023-04-21 ENCOUNTER — Ambulatory Visit
Admission: RE | Admit: 2023-04-21 | Discharge: 2023-04-21 | Disposition: A | Discharge status: Home / Self Care | Source: Ambulatory Visit | Attending: Urology | Admitting: Urology

## 2023-04-21 VITALS — BP 127/77 | HR 101 | Ht 72.0 in | Wt 185.0 lb

## 2023-04-21 DIAGNOSIS — N401 Enlarged prostate with lower urinary tract symptoms: Secondary | ICD-10-CM

## 2023-04-21 DIAGNOSIS — N200 Calculus of kidney: Secondary | ICD-10-CM

## 2023-04-21 NOTE — Progress Notes (Signed)
 I, Maysun Jamey Mccallum, acting as a scribe for Geraline Knapp, MD., have documented all relevant documentation on the behalf of Geraline Knapp, MD, as directed by Geraline Knapp, MD while in the presence of Geraline Knapp, MD.  04/21/2023 5:13 PM   Devin Landry 10/27/1940 [id number]  Referring provider: Little Riff, MD [address] [facility] [address] [facility] [place],  [place] [address]  Chief Complaint  Patient presents with   Nephrolithiasis   Urologic History  1. Bilateral nephrolithiasis Bilateral non-obstructing renal calculi Right ureteroscopy 04/2021 for a ureteral calculus which was noted passed interoperatively. He underwent treatment of his right renal calculi.   2.  BPH with urinary retention Urinary retention after lumbar spine surgery. He passed voiding trial, however, had incomplete emptying with residuals, around 100 mL.  UroLift 07/27/21 7 implants delivered  HPI: Devin Landry is a 83 y.o. male presents for annual follow-up.  Doing well with no bothersome LUTS, has noted some slight increased urgency but no incontinence. No flank, abdominal, or pelvic pain. KUB shows small calcifications overlying the renal outlines bilaterally.   PMH: Past Medical History:  Diagnosis Date   Allergic state    Arthritis    BPH (benign prostatic hyperplasia)    Bursitis    hips   Cancer (HCC)    basal and squamous cell   Cataracts, bilateral    ED (erectile dysfunction)    Elevated PSA    Heart murmur    born with mumur-asymptomatic   Hemorrhoids    History of chicken pox    History of colon polyps    History of kidney stones    Hypercholesteremia    Hypogonadism in male    Nephrolithiasis    PIN III (prostatic intraepithelial neoplasia III)    Spinal stenosis of lumbar region    Umbilical hernia     Surgical History: Past Surgical History:  Procedure Laterality Date   ANKLE FRACTURE SURGERY Left 10/10/2007   CARPAL TUNNEL RELEASE Right 2007   CATARACT  EXTRACTION, BILATERAL Bilateral    COLONOSCOPY     1993, 1996, 2000, 2005,2010, 2015, 2018   COLONOSCOPY WITH PROPOFOL N/A 09/21/2016   Procedure: COLONOSCOPY WITH PROPOFOL;  Surgeon: Cassie Click, MD;  Location: [facility] END[facility];  Service: Endoscopy;  Laterality: N/A;   CYSTOSCOPY W/ RETROGRADES  04/13/2021   Procedure: CYSTOSCOPY WITH RETROGRADE PYELOGRAM;  Surgeon: Geraline Knapp, MD;  Location: [facility] ORS;  Service: Urology;;   CYSTOSCOPY WITH INSERTION OF UROLIFT N/A 07/27/2021   Procedure: CYSTOSCOPY WITH INSERTION OF UROLIFT;  Surgeon: Geraline Knapp, MD;  Location: [facility] ORS;  Service: Urology;  Laterality: N/A;   CYSTOSCOPY/URETEROSCOPY/HOLMIUM LASER/STENT PLACEMENT Right 04/13/2021   Procedure: CYSTOSCOPY/URETEROSCOPY/HOLMIUM LASER/STENT PLACEMENT;  Surgeon: Geraline Knapp, MD;  Location: [facility] ORS;  Service: Urology;  Laterality: Right;   FEMUR FRACTURE SURGERY Left 10/10/2007   FRACTURE SURGERY Left 10/10/2007   ORIF DISTAL RADIUS FRACTURE AND PERTROCHANTERIC FEMUR FRACTURE   HERNIA REPAIR Bilateral 1964   KNEE SURGERY Left 1984   torn meniscus   KNEE SURGERY Right 2005   torn meniscus   LUMBAR LAMINECTOMY/DECOMPRESSION MICRODISCECTOMY N/A 07/02/2021   Procedure: L2-S1 DECOMPRESSION;  Surgeon: Jodeen Munch, MD;  Location: [facility] ORS;  Service: Neurosurgery;  Laterality: N/A;    Home Medications:  Allergies as of 04/21/2023       Reactions   Food Color Red Anaphylaxis   Red delicious apple skins   Other    Red Delicious  Apple skins causes anaphylaxis    Soy Allergy (obsolete) Other (See Comments)   Headache Other reaction(s): Headache   Zocor [simvastatin] Other (See Comments)   Leg pain        Medication List        Accurate as of April 21, 2023  5:13 PM. If you have any questions, ask your nurse or doctor.          STOP taking these medications    finasteride 5 MG tablet Commonly known as: PROSCAR Stopped by: Geraline Knapp       TAKE  these medications    acetaminophen 650 MG CR tablet Commonly known as: TYLENOL Take 650 mg by mouth 2 (two) times daily.   B-12 PO Take 1 tablet by mouth 4 (four) times a week.   CALCIUM CITRATE + D PO Take 1 tablet by mouth 2 (two) times daily. Calcium citrate 500 mg Vitamin D3 400 IU   cholestyramine 4 GM/DOSE powder Commonly known as: QUESTRAN Take 4 g by mouth daily as needed (diarrhea).   CLEAR FIBER POWDER PO Take 1 Dose by mouth daily as needed.   diphenhydramine-acetaminophen 25-500 MG Tabs tablet Commonly known as: TYLENOL PM Take 1 tablet by mouth at bedtime as needed.   docusate sodium 100 MG capsule Commonly known as: COLACE Take 100 mg by mouth daily.   EQL Vitamin D3 25 MCG (1000 UT) capsule Generic drug: Cholecalciferol Take 1,000 Units by mouth daily as needed.   gabapentin 300 MG capsule Commonly known as: NEURONTIN Take 1 capsule by mouth at bedtime. What changed: Another medication with the same name was removed. Continue taking this medication, and follow the directions you see here. Changed by: Geraline Knapp   GLUCOSAMINE 1500 COMPLEX PO Take 1,500 mg by mouth 2 (two) times daily.   methocarbamol 500 MG tablet Commonly known as: ROBAXIN Take 1 tablet (500 mg total) by mouth every 8 (eight) hours as needed for muscle spasms.   multivitamin tablet Take 1 tablet by mouth daily.   naproxen sodium 220 MG tablet Commonly known as: ALEVE Take 220 mg by mouth daily.   oxymetazoline 0.05 % nasal spray Commonly known as: AFRIN Place 1 spray into both nostrils 2 (two) times daily as needed for congestion.   rosuvastatin 5 MG tablet Commonly known as: CRESTOR Take 5 mg by mouth every evening.   Uribel 118 MG Caps Take 1 capsule (118 mg total) by mouth 3 (three) times daily as needed (Urinary frequency, urgency, burning).        Allergies:  Allergies  Allergen Reactions   Food Color Red Anaphylaxis    Red delicious apple skins   Other      Red Delicious Apple skins causes anaphylaxis     Soy Allergy (Obsolete) Other (See Comments)    Headache Other reaction(s): Headache   Zocor [Simvastatin] Other (See Comments)    Leg pain    Social History:  reports that he quit smoking about 44 years ago. His smoking use included pipe. He has never used smokeless tobacco. He reports that he does not drink alcohol and does not use drugs.   Physical Exam: BP 127/77   Pulse (!) 101   Ht 6' (1.829 m)   Wt 185 lb (83.9 kg)   BMI 25.09 kg/m   Constitutional:  Alert and oriented, No acute distress. HEENT: [place] AT Respiratory: Normal respiratory effort, no increased work of breathing. Psychiatric: Normal mood and affect.   Assessment &  Plan:    BPH without LUTS Doing well status post UroLift Follow up 1 year with PVR.  2. Bilateral nephrolithiasis Stable, KUB today without significant change from prior KUB.  1 year follow-up with KUB.  I have reviewed the above documentation for accuracy and completeness, and I agree with the above.   Geraline Knapp, MD  [facility] [address], [address] [place], [place] [address] [phone]

## 2023-08-17 NOTE — Progress Notes (Signed)
 [facility] - Clinic Note  08/21/2023   CHIEF COMPLAINT Patient presents for Retina Evaluation  HISTORY OF PRESENT ILLNESS: Devin Landry is a 83 y.o. male who presents to the clinic today for:  HPI     Retina Evaluation   In right eye.  I, the attending physician,  performed the HPI with the patient and updated documentation appropriately.        Comments   Pt referred by Dr. Glenwood to evaluation retinoschisis OD. Pt states on 08/09/2023 he noticed there was a new floater in his right eye but it left in a few hours and was followed by a larger floater. On 08/11/23 he noticed a large gray boulder in his temporal vision that was stationary. Pt saw Dr. Glenwood on 8/4 but by 8/6 there was another gray boulder on the lower nasal portion of his eye which has grown up over the media of his right eye.      Last edited by Valdemar Rogue, MD on 08/21/2023  5:34 PM.     Pt states symptoms began on 08/11/23, noticing the shadow in OD. Pt was seen on 8/4 at [facility] in [place]. Pt reports worsening since then, shadow getting larger.   Referring physician: Karolynn Drivers, OD [facility], [place]  HISTORICAL INFORMATION:  Selected notes from the MEDICAL RECORD NUMBER Referred by Dr. Drivers Karolynn, OD for retinoschisis vs RD OD LEE:  Ocular Hx- pseudophakia OU (Epes) PMH-   CURRENT MEDICATIONS: No current outpatient medications on file. (Ophthalmic Drugs)   No current facility-administered medications for this visit. (Ophthalmic Drugs)   Current Outpatient Medications (Other)  Medication Sig   acetaminophen  (TYLENOL ) 650 MG CR tablet Take 650 mg by mouth 2 (two) times daily.   Calcium  Citrate-Vitamin D (CALCIUM  CITRATE + D PO) Take 1 tablet by mouth 2 (two) times daily. Calcium  citrate 500 mg Vitamin D3 400 IU   Cholecalciferol (EQL VITAMIN D3) 25 MCG (1000 UT) capsule Take 1,000 Units by mouth daily as needed.   Corn Dextrin (CLEAR FIBER POWDER  PO) Take 1 Dose by mouth daily as needed.   Cyanocobalamin (B-12 PO) Take 1 tablet by mouth 4 (four) times a week.   diphenhydramine-acetaminophen  (TYLENOL  PM) 25-500 MG TABS tablet Take 1 tablet by mouth at bedtime as needed.   docusate sodium  (COLACE) 100 MG capsule Take 100 mg by mouth daily.   gabapentin  (NEURONTIN ) 300 MG capsule Take 1 capsule by mouth at bedtime.   Glucosamine-Chondroit-Vit C-Mn (GLUCOSAMINE 1500 COMPLEX PO) Take 1,500 mg by mouth 2 (two) times daily.   Multiple Vitamin (MULTIVITAMIN) tablet Take 1 tablet by mouth daily.   naproxen sodium (ANAPROX) 220 MG tablet Take 220 mg by mouth daily.   oxymetazoline (AFRIN) 0.05 % nasal spray Place 1 spray into both nostrils 2 (two) times daily as needed for congestion.   rosuvastatin  (CRESTOR ) 5 MG tablet Take 5 mg by mouth 3 (three) times a week.   cholestyramine  (QUESTRAN ) 4 GM/DOSE powder Take 4 g by mouth daily as needed (diarrhea). (Patient not taking: Reported on 08/21/2023)   Meth-Hyo-M Bl-Na Phos-Ph Sal (URIBEL ) 118 MG CAPS Take 1 capsule (118 mg total) by mouth 3 (three) times daily as needed (Urinary frequency, urgency, burning). (Patient not taking: Reported on 08/21/2023)   methocarbamol  (ROBAXIN ) 500 MG tablet Take 1 tablet (500 mg total) by mouth every 8 (eight) hours as needed for muscle spasms. (Patient not taking: Reported on 08/21/2023)   No  current facility-administered medications for this visit. (Other)   REVIEW OF SYSTEMS: ROS   Positive for: Musculoskeletal, Eyes Negative for: Constitutional, Gastrointestinal, Neurological, Skin, Genitourinary, HENT, Endocrine, Cardiovascular, Respiratory, Psychiatric, Allergic/Imm, Heme/Lymph Last edited by Elnor Avelina RAMAN, COT on 08/21/2023  8:41 AM.     ALLERGIES Allergies  Allergen Reactions   Food Color Red Anaphylaxis    Red delicious apple skins   Other     Red Delicious Apple skins causes anaphylaxis     Soy Allergy (Obsolete) Other (See Comments)     Headache Other reaction(s): Headache   Zocor [Simvastatin] Other (See Comments)    Leg pain   PAST MEDICAL HISTORY Past Medical History:  Diagnosis Date   Allergic state    Arthritis    BPH (benign prostatic hyperplasia)    Bursitis    hips   Cancer (HCC)    basal and squamous cell   Cataracts, bilateral    ED (erectile dysfunction)    Elevated PSA    Heart murmur    born with mumur-asymptomatic   Hemorrhoids    History of chicken pox    History of colon polyps    History of kidney stones    Hypercholesteremia    Hypogonadism in male    Nephrolithiasis    PIN III (prostatic intraepithelial neoplasia III)    Spinal stenosis of lumbar region    Umbilical hernia    Past Surgical History:  Procedure Laterality Date   ANKLE FRACTURE SURGERY Left 10/10/2007   CARPAL TUNNEL RELEASE Right 2007   CATARACT EXTRACTION, BILATERAL Bilateral    COLONOSCOPY     1993, 1996, 2000, 2005,2010, 2015, 2018   COLONOSCOPY WITH PROPOFOL  N/A 09/21/2016   Procedure: COLONOSCOPY WITH PROPOFOL ;  Surgeon: Viktoria Lamar DASEN, MD;  Location: [facility] ENDOSCOPY;  Service: Endoscopy;  Laterality: N/A;   CYSTOSCOPY W/ RETROGRADES  04/13/2021   Procedure: CYSTOSCOPY WITH RETROGRADE PYELOGRAM;  Surgeon: Twylla Glendia BROCKS, MD;  Location: [facility] ORS;  Service: Urology;;   CYSTOSCOPY WITH INSERTION OF UROLIFT N/A 07/27/2021   Procedure: CYSTOSCOPY WITH INSERTION OF UROLIFT;  Surgeon: Twylla Glendia BROCKS, MD;  Location: [facility] ORS;  Service: Urology;  Laterality: N/A;   CYSTOSCOPY/URETEROSCOPY/HOLMIUM LASER/STENT PLACEMENT Right 04/13/2021   Procedure: CYSTOSCOPY/URETEROSCOPY/HOLMIUM LASER/STENT PLACEMENT;  Surgeon: Twylla Glendia BROCKS, MD;  Location: [facility] ORS;  Service: Urology;  Laterality: Right;   FEMUR FRACTURE SURGERY Left 10/10/2007   FRACTURE SURGERY Left 10/10/2007   ORIF DISTAL RADIUS FRACTURE AND PERTROCHANTERIC FEMUR FRACTURE   HERNIA REPAIR Bilateral 1964   KNEE SURGERY Left 1984   torn meniscus   KNEE  SURGERY Right 2005   torn meniscus   LUMBAR LAMINECTOMY/DECOMPRESSION MICRODISCECTOMY N/A 07/02/2021   Procedure: L2-S1 DECOMPRESSION;  Surgeon: Clois Fret, MD;  Location: [facility] ORS;  Service: Neurosurgery;  Laterality: N/A;   FAMILY HISTORY History reviewed. No pertinent family history. SOCIAL HISTORY Social History   Tobacco Use   Smoking status: Former    Types: Pipe    Quit date: 05/15/1978    Years since quitting: 45.2   Smokeless tobacco: Never  Vaping Use   Vaping status: Never Used  Substance Use Topics   Alcohol use: No   Drug use: No       OPHTHALMIC EXAM:  Base Eye Exam     Visual Acuity (Snellen - Linear)       Right Left   Dist cc 20/250 +2 20/40 +2   Dist ph cc NI NI    Correction: Glasses  Tonometry (Tonopen, 8:28 AM)       Right Left   Pressure 16 19         Pupils       Pupils Dark Light Shape React APD   Right PERRL 4 3 Round Brisk None   Left PERRL 3 2 Round Brisk None         Visual Fields       Left Right    Full    Restrictions  Total inferior temporal, superior nasal, inferior nasal deficiencies         Extraocular Movement       Right Left    Full, Ortho Full, Ortho         Neuro/Psych     Oriented x3: Yes         Dilation     Both eyes: 1.0% Mydriacyl, 2.5% Phenylephrine  @ 8:28 AM           Slit Lamp and Fundus Exam     External Exam       Right Left   External Normal Normal         Slit Lamp Exam       Right Left   Lids/Lashes Dermatochalasis, mild MGD Dermatochalasis, mild MGD   Conjunctiva/Sclera White and quiet White and quiet   Cornea Well healed temporal cataract wound, tear film debris, fine endo pigment, 1+ Guttata, nasal and temporal LRI Well healed temporal cataract wound, tear film debris, trace PEE, nasal and temporal LRI, 2-3+ Guttata   Anterior Chamber Deep, 3+fine cell and pigment Deep, 3+fine cell and pigment   Iris Round and reactive Round and reactive   Lens  3 piece PC IOL in good position w/ open PC 3 piece PC IOL in good position w/ open PC   Anterior Vitreous Vitreous syneresis, pigment Vitreous syneresis         Fundus Exam       Right Left   Disc Hazy view, Pink and Sharp Pink and Sharp, Compact   C/D Ratio 0.3 0.3   Macula Detached, no heme Flat, Blunted foveal reflex, RPE mottling, no heme or edema   Vessels Attenuated, mild tortuosity Attenuated, mild tortuosity   Periphery Bullous superior detachment from [phone], hole at 1200, pigmented CR scarring @ 1030 Attached, scattered pigmented CR scarring at 1200 and temporally, No heme, No edema           Refraction     Wearing Rx       Sphere Cylinder Axis Add   Right -1.25 +1.50 179 +2.50   Left -0.50 +1.00 161 +2.50    Type: Progressive         Manifest Refraction (Subjective)       Sphere Cylinder Axis Dist VA   Right -1.75 +1.50 179 20/250+2   Left -0.50 +0.50 161 30+2           IMAGING AND PROCEDURES  Imaging and Procedures for 08/21/2023  OCT, Retina - OU - Both Eyes       Right Eye Quality was good. Central Foveal Thickness: 1187. Progression has no prior data. Findings include abnormal foveal contour, epiretinal membrane, intraretinal fluid, subretinal fluid (Bullous superior detachment w/ extension below fovea. ).   Left Eye Quality was good. Central Foveal Thickness: 326. Progression has no prior data. Findings include normal foveal contour, no IRF, no SRF, epiretinal membrane (Mild ERM w/ blunting of foveal contour. ).   Notes *Images captured and stored on drive  Diagnosis / Impression:  OD: Bullous superior retinal detachment w/ extension inferiorly through fovea OS: Mild ERM w/ blunting of foveal contour.   Clinical management:  See below  Abbreviations: NFP - Normal foveal profile. CME - cystoid macular edema. PED - pigment epithelial detachment. IRF - intraretinal fluid. SRF - subretinal fluid. EZ - ellipsoid zone. ERM - epiretinal  membrane. ORA - outer retinal atrophy. ORT - outer retinal tubulation. SRHM - subretinal hyper-reflective material. IRHM - intraretinal hyper-reflective material      Color Fundus Photography Optos - OU - Both Eyes       Right Eye Progression has no prior data. Disc findings include pallor. Macula : detached. Vessels : normal observations. Periphery : detachment (Bullous superior RD from 0900-0430).   Left Eye Progression has no prior data. Disc findings include pallor. Macula : normal observations. Vessels : attenuated. Periphery : RPE abnormality (Pigmented CR scarring w/ atrophy temporal periphery).   Notes *Images captured and stored on drive  Diagnosis / Impression:  OD: hazy images - superior RD from 0900-0430 oclock OS: Pigmented CR scarring w/ atrophy temporal periphery  Clinical management:  See below  Abbreviations: NFP - Normal foveal profile. CME - cystoid macular edema. PED - pigment epithelial detachment. IRF - intraretinal fluid. SRF - subretinal fluid. EZ - ellipsoid zone. ERM - epiretinal membrane. ORA - outer retinal atrophy. ORT - outer retinal tubulation. SRHM - subretinal hyper-reflective material. IRHM - intraretinal hyper-reflective material      Pneumatic Retinopexy - OD - Right Eye       Tear locations include superior.   Time Out Confirmed correct patient, procedure, site, and patient consented.   Anesthesia Subconjunctival anesthesia was used. Anesthetic medications included Lidocaine  4%.   Post-op The patient tolerated the procedure well. There were no complications. The patient received written and verbal post procedure care education.   Notes PROCEDURE NOTE  Diagnosis:  Retinal detachment with retinal tear, RIGHT EYE  Procedure:  Pneumatic cryopexy with C3F8 gas injection, RIGHT EYE  CPT:  32889  Surgeon: Redell Hans, M.D.,Ph.D.  Anesthesia:  Subconjunctival Lidocaine    The patient was brought to the procedure room. Informed consent  was obtained for cryopexy of tear and intravitreal gas injection. The diagnosis was reviewed with the patient and all questions were answered. The risks of the procedure including potential systemic risks like stroke and heart attack and other thrombotic events as well as the local risks like infection, endophthalmitis, retinal detachment were discussed. The patient consented to the procedure.   The RIGHT eye was marked and a time out was performed identifying the correct eye. Subsequently, the patient was placed in the supine position. Topical anesthetic drops were given and a lid speculum was placed to expose the eye. Subsequently, 2% Lidocaine  was injected subconjunctivally in the quadrants of the tears giving adequate anesthesia. Using indirect ophthalmoscopy the cryo probe was positioned beneath the tear at 1200 and choroidal and retinal whitening was achieved 360 degrees around the tear margins. The superotemporal quadrant was then cleaned with Betadine swabs and allowed to dry. At this time, 3.5 mm posterior to the limbus utilizing a 30-gauge needle, 0.45 cc of pure, 100% C3F8 gas was injected into the vitreous cavity under direct visualization. The needle was then withdrawn from the eye and the retina and gas bubble were visualized. An AC tap was performed to normalize the pressure and the central retinal artery was noted to be patent. Betadine was then reapplied to the injection sites and then  rinsed with sterile BSS. A drop of polymixin ophthalmic soln and polysporin opthalmic ung were applied to the eye, then the eye was double patched with eye pads. An arrow was drawn on the dressing to assist with post-operative positioning. There were no complications. Discharge and post-operative instructions were reviewed.           ASSESSMENT/PLAN:   ICD-10-CM   1. Right retinal detachment  H33.21 OCT, Retina - OU - Both Eyes    Color Fundus Photography Optos - OU - Both Eyes    Pneumatic Retinopexy -  OD - Right Eye    2. Epiretinal membrane (ERM) of both eyes  H35.373     3. Essential hypertension  I10     4. Hypertensive retinopathy of both eyes  H35.033     5. Pseudophakia of both eyes  Z96.1      Rhegmatogenous retinal detachment, OD - Bullous superior detachment w/ extension inferiorly through fovea, onset ~08.01.25 by history - The incidence, risk factors, and natural history of retinal detachment was discussed with patient.   - Potential treatment options including delimiting laser, pneumatic retinopexy, scleral buckle, and vitrectomy, cryotherapy and laser, and the use of air, gas, and oil discussed with patient. - The risks of blindness, loss of vision, infection, hemorrhage, cataract progression or lens displacement were discussed with patient. - discussed possible surgery on 08.21.25 - recommend pneumatic cryopexy OD today, 08.11.25 - pt wishes to proceed with procedure - RBA of procedure discussed, questions answered - informed consent obtained and signed - see procedure note - positioning guidelines reviewed, hold face down position full time.  - begin Maxitrol  QID OD - f/u tomorrow 8.12.25 @ 2pm -- POV / DFE  2. Epiretinal membrane, OU - The natural history, anatomy, potential for loss of vision, and treatment options including vitrectomy techniques and the complications of endophthalmitis, retinal detachment, vitreous hemorrhage, cataract progression and permanent vision loss discussed with the patient. - mild ERM OU - no indication for surgery at this time - monitor for now  3,4. Hypertensive retinopathy OU - discussed importance of tight BP control - monitor   5. Pseudophakia OU  - s/p CE/IOL  - IOL in good position, doing well  - monitor   Ophthalmic Meds Ordered this visit:  No orders of the defined types were placed in this encounter.    Return in about 1 day (around 08/22/2023) for 8AM for s/p pneumatic cryo, RD OD.  There are no Patient  Instructions on file for this visit.  Explained the diagnoses, plan, and follow up with the patient and they expressed understanding.  Patient expressed understanding of the importance of proper follow up care.   This document serves as a record of services personally performed by [name], MD, PhD. It was created on their behalf by Avelina Pereyra, COA an ophthalmic technician. The creation of this record is the provider's dictation and/or activities during the visit.   Electronically signed by: Avelina GORMAN Pereyra, COT  08/21/23  5:55 PM   This document serves as a record of services personally performed by [name], MD, PhD. It was created on their behalf by Almetta Pesa, an ophthalmic technician. The creation of this record is the provider's dictation and/or activities during the visit.    Electronically signed by: Almetta Pesa, OA, 08/21/23  5:55 PM  [name], M.D., Ph.D. Diseases & Surgery of the Retina and Vitreous [facility] 08/21/2023  I have  reviewed the above documentation for accuracy and completeness, and I agree with the above. [name], M.D., Ph.D. 08/21/23 6:03 PM   Abbreviations: M myopia (nearsighted); A astigmatism; H hyperopia (farsighted); P presbyopia; Mrx spectacle prescription;  CTL contact lenses; OD right eye; OS left eye; OU both eyes  XT exotropia; ET esotropia; PEK punctate epithelial keratitis; PEE punctate epithelial erosions; DES dry eye syndrome; MGD meibomian gland dysfunction; ATs artificial tears; PFAT's preservative free artificial tears; NSC nuclear sclerotic cataract; PSC posterior subcapsular cataract; ERM epi-retinal membrane; PVD posterior vitreous detachment; RD retinal detachment; DM diabetes mellitus; DR diabetic retinopathy; NPDR non-proliferative diabetic retinopathy; PDR proliferative diabetic retinopathy; CSME clinically significant macular edema; DME diabetic macular edema; dbh dot blot  hemorrhages; CWS cotton wool spot; POAG primary open angle glaucoma; C/D cup-to-disc ratio; HVF humphrey visual field; GVF goldmann visual field; OCT optical coherence tomography; IOP intraocular pressure; BRVO Branch retinal vein occlusion; CRVO central retinal vein occlusion; CRAO central retinal artery occlusion; BRAO branch retinal artery occlusion; RT retinal tear; SB scleral buckle; PPV pars plana vitrectomy; VH Vitreous hemorrhage; PRP panretinal laser photocoagulation; IVK intravitreal kenalog ; VMT vitreomacular traction; MH Macular hole;  NVD neovascularization of the disc; NVE neovascularization elsewhere; AREDS age related eye disease study; ARMD age related macular degeneration; POAG primary open angle glaucoma; EBMD epithelial/anterior basement membrane dystrophy; ACIOL anterior chamber intraocular lens; IOL intraocular lens; PCIOL posterior chamber intraocular lens; Phaco/IOL phacoemulsification with intraocular lens placement; PRK photorefractive keratectomy; LASIK laser assisted in situ keratomileusis; HTN hypertension; DM diabetes mellitus; COPD chronic obstructive pulmonary disease

## 2023-08-21 ENCOUNTER — Encounter (INDEPENDENT_AMBULATORY_CARE_PROVIDER_SITE_OTHER): Payer: Self-pay | Admitting: Ophthalmology

## 2023-08-21 ENCOUNTER — Ambulatory Visit (INDEPENDENT_AMBULATORY_CARE_PROVIDER_SITE_OTHER): Admitting: Ophthalmology

## 2023-08-21 DIAGNOSIS — H3581 Retinal edema: Secondary | ICD-10-CM

## 2023-08-21 DIAGNOSIS — H35033 Hypertensive retinopathy, bilateral: Secondary | ICD-10-CM

## 2023-08-21 DIAGNOSIS — H3321 Serous retinal detachment, right eye: Secondary | ICD-10-CM

## 2023-08-21 DIAGNOSIS — I10 Essential (primary) hypertension: Secondary | ICD-10-CM

## 2023-08-21 DIAGNOSIS — Z961 Presence of intraocular lens: Secondary | ICD-10-CM

## 2023-08-21 DIAGNOSIS — H35373 Puckering of macula, bilateral: Secondary | ICD-10-CM

## 2023-08-22 ENCOUNTER — Ambulatory Visit (INDEPENDENT_AMBULATORY_CARE_PROVIDER_SITE_OTHER): Admitting: Ophthalmology

## 2023-08-22 ENCOUNTER — Encounter (INDEPENDENT_AMBULATORY_CARE_PROVIDER_SITE_OTHER): Payer: Self-pay | Admitting: Ophthalmology

## 2023-08-22 DIAGNOSIS — H35033 Hypertensive retinopathy, bilateral: Secondary | ICD-10-CM | POA: Diagnosis not present

## 2023-08-22 DIAGNOSIS — H35373 Puckering of macula, bilateral: Secondary | ICD-10-CM

## 2023-08-22 DIAGNOSIS — H3321 Serous retinal detachment, right eye: Secondary | ICD-10-CM

## 2023-08-22 DIAGNOSIS — Z961 Presence of intraocular lens: Secondary | ICD-10-CM

## 2023-08-22 DIAGNOSIS — I10 Essential (primary) hypertension: Secondary | ICD-10-CM

## 2023-08-22 MED ORDER — MAXITROL 3.5-10000-0.1 OP OINT: 1.0000 | TOPICAL_OINTMENT | Freq: Four times a day (QID) | OPHTHALMIC | 2 refills | Status: AC

## 2023-08-22 MED ORDER — PREDNISOLONE ACETATE 1 % OP SUSP: 1.0000 [drp] | Freq: Four times a day (QID) | OPHTHALMIC | 2 refills | Status: AC

## 2023-08-22 NOTE — Progress Notes (Signed)
 [facility] - Clinic Note  08/22/2023   CHIEF COMPLAINT Patient presents for Retina Follow Up  HISTORY OF PRESENT ILLNESS: Devin Landry is a 83 y.o. male who presents to the clinic today for:  HPI     Retina Follow Up   Patient presents with  Other.  In right eye.  This started 1 day ago.  I, the attending physician,  performed the HPI with the patient and updated documentation appropriately.        Comments   Patient here for 1 day retina follow up for post op cryo OD. Patient states vision been changing. Can see some. No eye pain. Using ung QID OD.       Last edited by Valdemar Rogue, MD on 08/24/2023 12:52 AM.    Pt states VA seems a bit better, can see several bubbles in OD. Maintaining positioning w/ massage chairs at home.   Referring physician: Karolynn Drivers, OD [facility], [place]  HISTORICAL INFORMATION:  Selected notes from the MEDICAL RECORD NUMBER Referred by Dr. Drivers Karolynn, OD for retinoschisis vs RD OD LEE:  Ocular Hx- pseudophakia OU (Epes) PMH-   CURRENT MEDICATIONS: Current Outpatient Medications (Ophthalmic Drugs)  Medication Sig   neomycin-polymyxin-dexameth (MAXITROL ) 0.1 % OINT Place 1 Application into the right eye 4 (four) times daily.   prednisoLONE  acetate (PRED FORTE ) 1 % ophthalmic suspension Place 1 drop into the left eye 4 (four) times daily.   No current facility-administered medications for this visit. (Ophthalmic Drugs)   Current Outpatient Medications (Other)  Medication Sig   acetaminophen  (TYLENOL ) 650 MG CR tablet Take 650 mg by mouth 2 (two) times daily.   Calcium  Citrate-Vitamin D (CALCIUM  CITRATE + D PO) Take 1 tablet by mouth 2 (two) times daily. Calcium  citrate 500 mg Vitamin D3 400 IU   Cholecalciferol (EQL VITAMIN D3) 25 MCG (1000 UT) capsule Take 1,000 Units by mouth daily as needed.   Corn Dextrin (CLEAR FIBER POWDER PO) Take 1 Dose by mouth daily as needed.   Cyanocobalamin (B-12 PO) Take  1 tablet by mouth 4 (four) times a week.   diphenhydramine-acetaminophen  (TYLENOL  PM) 25-500 MG TABS tablet Take 1 tablet by mouth at bedtime as needed.   docusate sodium  (COLACE) 100 MG capsule Take 100 mg by mouth daily.   Glucosamine-Chondroit-Vit C-Mn (GLUCOSAMINE 1500 COMPLEX PO) Take 1,500 mg by mouth 2 (two) times daily.   Multiple Vitamin (MULTIVITAMIN) tablet Take 1 tablet by mouth daily.   naproxen sodium (ANAPROX) 220 MG tablet Take 220 mg by mouth daily.   oxymetazoline (AFRIN) 0.05 % nasal spray Place 1 spray into both nostrils 2 (two) times daily as needed for congestion.   rosuvastatin  (CRESTOR ) 5 MG tablet Take 5 mg by mouth 3 (three) times a week.   cholestyramine  (QUESTRAN ) 4 GM/DOSE powder Take 4 g by mouth daily as needed (diarrhea). (Patient not taking: Reported on 08/22/2023)   gabapentin  (NEURONTIN ) 300 MG capsule Take 1 capsule by mouth at bedtime.   Meth-Hyo-M Bl-Na Phos-Ph Sal (URIBEL ) 118 MG CAPS Take 1 capsule (118 mg total) by mouth 3 (three) times daily as needed (Urinary frequency, urgency, burning). (Patient not taking: Reported on 08/22/2023)   methocarbamol  (ROBAXIN ) 500 MG tablet Take 1 tablet (500 mg total) by mouth every 8 (eight) hours as needed for muscle spasms. (Patient not taking: Reported on 08/22/2023)   No current facility-administered medications for this visit. (Other)   REVIEW OF SYSTEMS: ROS   Positive  for: Musculoskeletal, Eyes Negative for: Constitutional, Gastrointestinal, Neurological, Skin, Genitourinary, HENT, Endocrine, Cardiovascular, Respiratory, Psychiatric, Allergic/Imm, Heme/Lymph Last edited by Orval Asberry RAMAN, COA on 08/22/2023  2:08 PM.      ALLERGIES Allergies  Allergen Reactions   Food Color Red Anaphylaxis    Red delicious apple skins   Other     Red Delicious Apple skins causes anaphylaxis     Soy Allergy (Obsolete) Other (See Comments)    Headache Other reaction(s): Headache   Zocor [Simvastatin] Other (See Comments)     Leg pain   PAST MEDICAL HISTORY Past Medical History:  Diagnosis Date   Allergic state    Arthritis    BPH (benign prostatic hyperplasia)    Bursitis    hips   Cancer (HCC)    basal and squamous cell   Cataracts, bilateral    ED (erectile dysfunction)    Elevated PSA    Heart murmur    born with mumur-asymptomatic   Hemorrhoids    History of chicken pox    History of colon polyps    History of kidney stones    Hypercholesteremia    Hypogonadism in male    Nephrolithiasis    PIN III (prostatic intraepithelial neoplasia III)    Spinal stenosis of lumbar region    Umbilical hernia    Past Surgical History:  Procedure Laterality Date   ANKLE FRACTURE SURGERY Left 10/10/2007   CARPAL TUNNEL RELEASE Right 2007   CATARACT EXTRACTION, BILATERAL Bilateral    COLONOSCOPY     1993, 1996, 2000, 2005,2010, 2015, 2018   COLONOSCOPY WITH PROPOFOL  N/A 09/21/2016   Procedure: COLONOSCOPY WITH PROPOFOL ;  Surgeon: Viktoria Lamar DASEN, MD;  Location: [facility] ENDOSCOPY;  Service: Endoscopy;  Laterality: N/A;   CYSTOSCOPY W/ RETROGRADES  04/13/2021   Procedure: CYSTOSCOPY WITH RETROGRADE PYELOGRAM;  Surgeon: Twylla Glendia BROCKS, MD;  Location: [facility] ORS;  Service: Urology;;   CYSTOSCOPY WITH INSERTION OF UROLIFT N/A 07/27/2021   Procedure: CYSTOSCOPY WITH INSERTION OF UROLIFT;  Surgeon: Twylla Glendia BROCKS, MD;  Location: [facility] OR[facility];  Service: Urology;  Laterality: N/A;   CYSTOSCOPY/URETEROSCOPY/HOLMIUM LASER/STENT PLACEMENT Right 04/13/2021   Procedure: CYSTOSCOPY/URETEROSCOPY/HOLMIUM LASER/STENT PLACEMENT;  Surgeon: Twylla Glendia BROCKS, MD;  Location: [facility] ORS;  Service: Urology;  Laterality: Right;   FEMUR FRACTURE SURGERY Left 10/10/2007   FRACTURE SURGERY Left 10/10/2007   ORIF DISTAL RADIUS FRACTURE AND PERTROCHANTERIC FEMUR FRACTURE   HERNIA REPAIR Bilateral 1964   KNEE SURGERY Left 1984   torn meniscus   KNEE SURGERY Right 2005   torn meniscus   LUMBAR LAMINECTOMY/DECOMPRESSION MICRODISCECTOMY  N/A 07/02/2021   Procedure: L2-S1 DECOMPRESSION;  Surgeon: Clois Fret, MD;  Location: [facility] ORS;  Service: Neurosurgery;  Laterality: N/A;   FAMILY HISTORY History reviewed. No pertinent family history. SOCIAL HISTORY Social History   Tobacco Use   Smoking status: Former    Types: Pipe    Quit date: 05/15/1978    Years since quitting: 45.3   Smokeless tobacco: Never  Vaping Use   Vaping status: Never Used  Substance Use Topics   Alcohol use: No   Drug use: No       OPHTHALMIC EXAM:  Base Eye Exam     Visual Acuity (Snellen - Linear)       Right Left   Dist cc 20/400 -2 20/30 -2    Correction: Glasses         Tonometry (Tonopen, 2:05 PM)       Right Left   Pressure 19  18         Pupils       Dark Light Shape React APD   Right 4 3 Round Brisk None   Left 3 2 Round Brisk None         Visual Fields (Counting fingers)       Left Right    Full Full         Extraocular Movement       Right Left    Full, Ortho Full, Ortho         Neuro/Psych     Oriented x3: Yes   Mood/Affect: Normal         Dilation     Right eye: 1.0% Mydriacyl , 2.5% Phenylephrine  @ 2:05 PM           Slit Lamp and Fundus Exam     External Exam       Right Left   External Normal Normal         Slit Lamp Exam       Right Left   Lids/Lashes Mild upper lid edema Dermatochalasis, mild MGD   Conjunctiva/Sclera 360 sub conj heme White and quiet   Cornea Well healed temporal cataract wound, tear film debris, fine endo pigment, 1+ Guttata, nasal and temporal LRI Well healed temporal cataract wound, tear film debris, trace PEE, nasal and temporal LRI, 2-3+ Guttata   Anterior Chamber Deep, 0.5+cell Deep, 3+fine cell and pigment   Iris Round and dilated Round and reactive   Lens 3 piece PC IOL in good position w/ open PC 3 piece PC IOL in good position w/ open PC   Anterior Vitreous Vitreous syneresis, +pigment, 20% gas w/ multiple bubbles superiorly Vitreous  syneresis         Fundus Exam       Right Left   Disc Hazy view, mild pallor, sharp rim Pink and Sharp, Compact   C/D Ratio 0.3 0.3   Macula Hazy view, reattaching Flat, Blunted foveal reflex, RPE mottling, no heme or edema   Vessels Attenuated, mild tortuosity Attenuated, mild tortuosity   Periphery Superior retina reattached under gas bubbles, residual SRF temporal periphery. Pre OP: Bullous superior detachment from [phone], hole at 1200, pigmented CR scarring at 11 o'clock Attached, scattered pigmented CR scarring at 1200 and temporally, No heme, No edema           Refraction     Wearing Rx       Sphere Cylinder Axis Add   Right -1.25 +1.50 179 +2.50   Left -0.50 +1.00 161 +2.50    Type: Progressive           IMAGING AND PROCEDURES  Imaging and Procedures for 08/22/2023  OCT, Retina - OU - Both Eyes       Right Eye Quality was poor. Central Foveal Thickness: 1187. Progression has improved. Findings include normal foveal contour, no IRF, epiretinal membrane, subretinal fluid (Massive interval improvement in SRF--retina reattaching, fovea reattached. Residual SRF temporal periphery caught on widefield).   Left Eye Quality was good. Central Foveal Thickness: 326. Progression has been stable. Findings include normal foveal contour, no IRF, no SRF, epiretinal membrane (Mild ERM w/ blunting of foveal contour. ).   Notes *Images captured and stored on drive  Diagnosis / Impression:  OD: Massive interval improvement in SRF--retina reattaching, fovea reattached. Residual SRF temporal periphery caught on widefield OS: Mild ERM w/ blunting of foveal contour.   Clinical management:  See below  Abbreviations: NFP -  Normal foveal profile. CME - cystoid macular edema. PED - pigment epithelial detachment. IRF - intraretinal fluid. SRF - subretinal fluid. EZ - ellipsoid zone. ERM - epiretinal membrane. ORA - outer retinal atrophy. ORT - outer retinal tubulation. SRHM -  subretinal hyper-reflective material. IRHM - intraretinal hyper-reflective material            ASSESSMENT/PLAN:   ICD-10-CM   1. Right retinal detachment  H33.21 OCT, Retina - OU - Both Eyes    2. Epiretinal membrane (ERM) of both eyes  H35.373 OCT, Retina - OU - Both Eyes    3. Essential hypertension  I10     4. Hypertensive retinopathy of both eyes  H35.033     5. Pseudophakia of both eyes  Z96.1      Rhegmatogenous retinal detachment, OD - Bullous superior detachment w/ extension inferiorly through fovea, onset ~08.01.25 by history - s/p pneumatic cryopexy OD on 08.11.25 - OCT OD today shows Massive interval improvement in SRF--retina reattaching, fovea reattached. Residual SRF temporal periphery caught on widefield - positioning guidelines reviewed, continue to hold face down 45 degrees full time; sleep face down - cont Maxitrol  QID OD - begin pred QID OD - f/u Thursday, 8.14.25 AM - POV / DFE, OCT  2. Epiretinal membrane, OU - The natural history, anatomy, potential for loss of vision, and treatment options including vitrectomy techniques and the complications of endophthalmitis, retinal detachment, vitreous hemorrhage, cataract progression and permanent vision loss discussed with the patient. - mild ERM OU - no indication for surgery at this time - monitor for now  3,4. Hypertensive retinopathy OU - discussed importance of tight BP control - monitor   5. Pseudophakia OU  - s/p CE/IOL  - IOL in good position, doing well  - monitor   Ophthalmic Meds Ordered this visit:  Meds ordered this encounter  Medications   prednisoLONE  acetate (PRED FORTE ) 1 % ophthalmic suspension    Sig: Place 1 drop into the left eye 4 (four) times daily.    Dispense:  15 mL    Refill:  2   neomycin-polymyxin-dexameth (MAXITROL ) 0.1 % OINT    Sig: Place 1 Application into the right eye 4 (four) times daily.    Dispense:  3.5 g    Refill:  2     Return in about 2 days (around  08/24/2023) for Thursday AM RD OD, DFE, OCT.  There are no Patient Instructions on file for this visit.  Explained the diagnoses, plan, and follow up with the patient and they expressed understanding.  Patient expressed understanding of the importance of proper follow up care.   This document serves as a record of services personally performed by [name], MD, PhD. It was created on their behalf by Avelina Pereyra, COA an ophthalmic technician. The creation of this record is the provider's dictation and/or activities during the visit.   Electronically signed by: Avelina GORMAN Pereyra, COT  08/24/23  12:53 AM   This document serves as a record of services personally performed by [name], MD, PhD. It was created on their behalf by Almetta Pesa, an ophthalmic technician. The creation of this record is the provider's dictation and/or activities during the visit.    Electronically signed by: Almetta Pesa, OA, 08/24/23  12:53 AM  [name], M.D., Ph.D. Diseases & Surgery of the Retina and Vitreous [facility] 08/22/2023  I have reviewed the above documentation for accuracy and completeness, and I agree  with the above. [name], M.D., Ph.D. 08/21/23 12:53 AM   Abbreviations: M myopia (nearsighted); A astigmatism; H hyperopia (farsighted); P presbyopia; Mrx spectacle prescription;  CTL contact lenses; OD right eye; OS left eye; OU both eyes  XT exotropia; ET esotropia; PEK punctate epithelial keratitis; PEE punctate epithelial erosions; DES dry eye syndrome; MGD meibomian gland dysfunction; ATs artificial tears; PFAT's preservative free artificial tears; NSC nuclear sclerotic cataract; PSC posterior subcapsular cataract; ERM epi-retinal membrane; PVD posterior vitreous detachment; RD retinal detachment; DM diabetes mellitus; DR diabetic retinopathy; NPDR non-proliferative diabetic retinopathy; PDR proliferative diabetic retinopathy; CSME clinically  significant macular edema; DME diabetic macular edema; dbh dot blot hemorrhages; CWS cotton wool spot; POAG primary open angle glaucoma; C/D cup-to-disc ratio; HVF humphrey visual field; GVF goldmann visual field; OCT optical coherence tomography; IOP intraocular pressure; BRVO Branch retinal vein occlusion; CRVO central retinal vein occlusion; CRAO central retinal artery occlusion; BRAO branch retinal artery occlusion; RT retinal tear; SB scleral buckle; PPV pars plana vitrectomy; VH Vitreous hemorrhage; PRP panretinal laser photocoagulation; IVK intravitreal kenalog ; VMT vitreomacular traction; MH Macular hole;  NVD neovascularization of the disc; NVE neovascularization elsewhere; AREDS age related eye disease study; ARMD age related macular degeneration; POAG primary open angle glaucoma; EBMD epithelial/anterior basement membrane dystrophy; ACIOL anterior chamber intraocular lens; IOL intraocular lens; PCIOL posterior chamber intraocular lens; Phaco/IOL phacoemulsification with intraocular lens placement; PRK photorefractive keratectomy; LASIK laser assisted in situ keratomileusis; HTN hypertension; DM diabetes mellitus; COPD chronic obstructive pulmonary disease

## 2023-08-24 ENCOUNTER — Ambulatory Visit (INDEPENDENT_AMBULATORY_CARE_PROVIDER_SITE_OTHER): Admitting: Ophthalmology

## 2023-08-24 ENCOUNTER — Encounter (INDEPENDENT_AMBULATORY_CARE_PROVIDER_SITE_OTHER): Payer: Self-pay | Admitting: Ophthalmology

## 2023-08-24 ENCOUNTER — Encounter (INDEPENDENT_AMBULATORY_CARE_PROVIDER_SITE_OTHER): Admitting: Ophthalmology

## 2023-08-24 DIAGNOSIS — H35373 Puckering of macula, bilateral: Secondary | ICD-10-CM | POA: Diagnosis not present

## 2023-08-24 DIAGNOSIS — Z961 Presence of intraocular lens: Secondary | ICD-10-CM

## 2023-08-24 DIAGNOSIS — H35033 Hypertensive retinopathy, bilateral: Secondary | ICD-10-CM

## 2023-08-24 DIAGNOSIS — I10 Essential (primary) hypertension: Secondary | ICD-10-CM

## 2023-08-24 DIAGNOSIS — H3321 Serous retinal detachment, right eye: Secondary | ICD-10-CM | POA: Diagnosis not present

## 2023-08-24 IMAGING — MR MR LUMBAR SPINE W/O CM
4 of 5 series · 32 of 48 positions shown · non-contrast
Comparison: CT abdomen/pelvis 12/08/2020

CLINICAL DATA: History of fall in 4886, left low back pain
radiating to left leg and foot, numbness running down left leg for 2
years

EXAM:
MRI LUMBAR SPINE WITHOUT CONTRAST
TECHNIQUE: Multiplanar, multisequence MR imaging of the lumbar spine was
performed. No intravenous contrast was administered.

[Series 5: T2 · sagittal · 4.0mm · 0.81mm/px · 8 of 17 slices shown (1 of 2)]
[im 1/17]
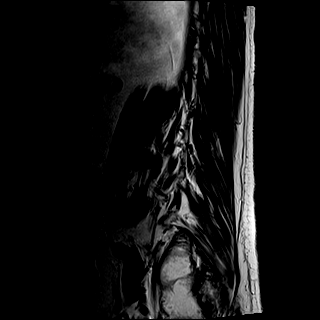
[im 3/17]
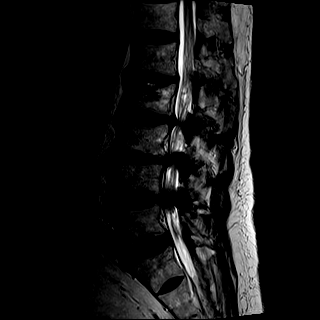
[im 5/17]
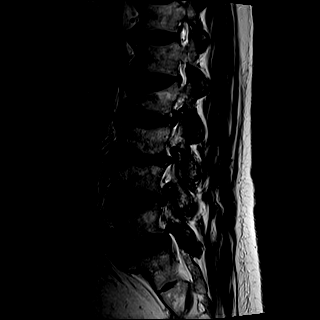
[im 7/17]
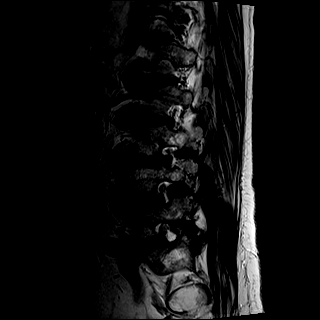
[im 10/17]
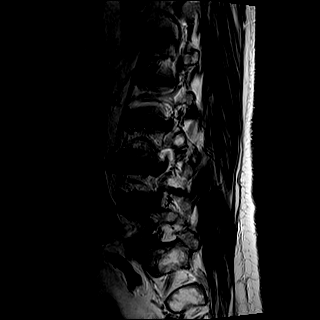
[im 12/17]
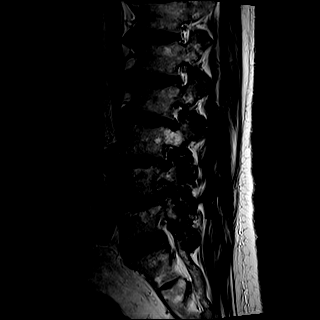
[im 14/17]
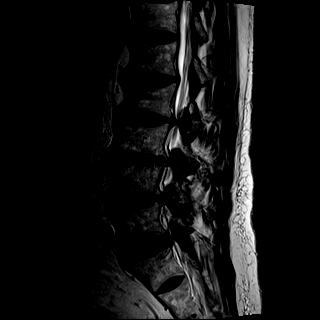
[im 17/17]
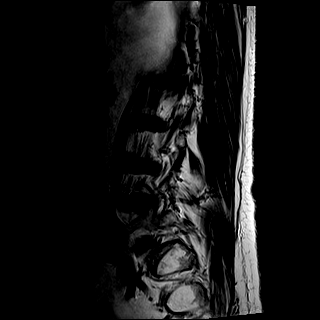

[Series 6: T1 · sagittal · 4.0mm · 0.81mm/px · 7 of 17 slices shown (1 of 2)]
[im 1/17]
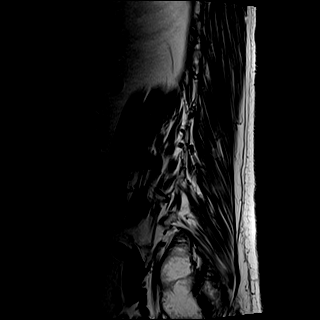
[im 3/17]
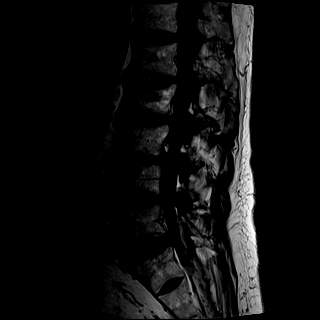
[im 6/17]
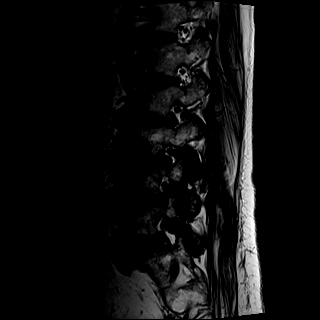
[im 9/17]
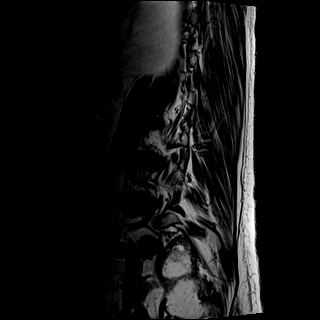
[im 11/17]
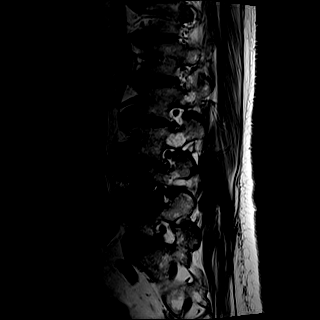
[im 14/17]
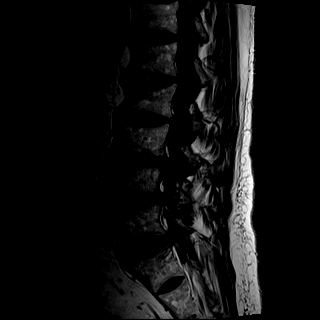
[im 17/17]
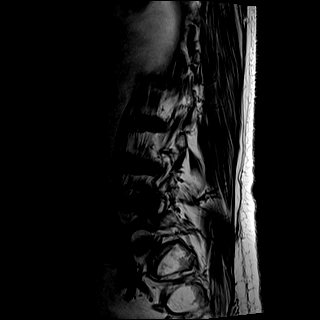

[Series 8: T2 · axial · 4.0mm · 0.78mm/px · z∈[-38,+140]mm · 9 of 29 slices shown (2 of 2)]
[im 1/29]
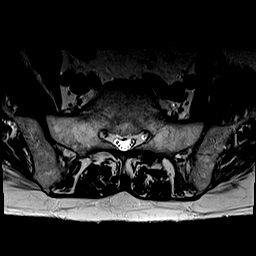
[im 5/29]
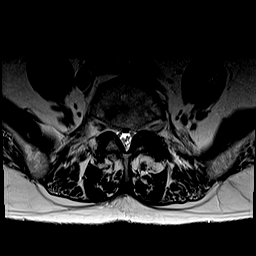
[im 10/29]
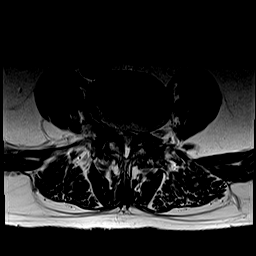
[im 12/29]
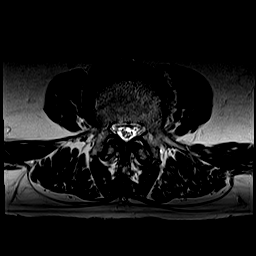
[im 15/29]
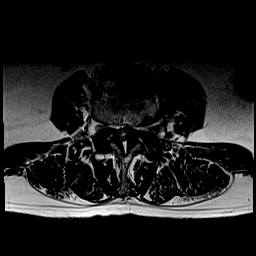
[im 17/29]
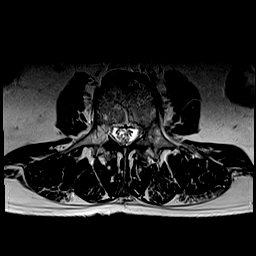
[im 19/29]
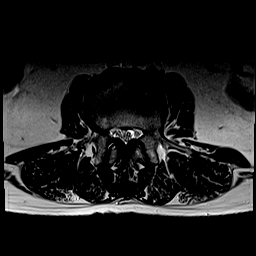
[im 24/29]
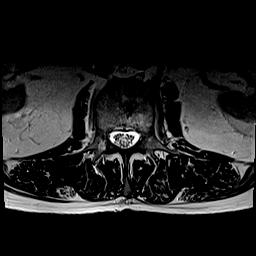
[im 29/29]
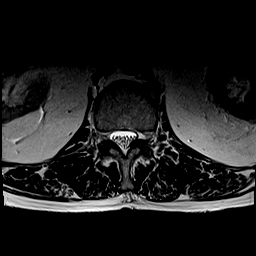

[Series 9: T1 · axial · 4.0mm · 0.39mm/px · z∈[-38,+115]mm · 8 of 29 slices shown (2 of 2)]
[im 1/29]
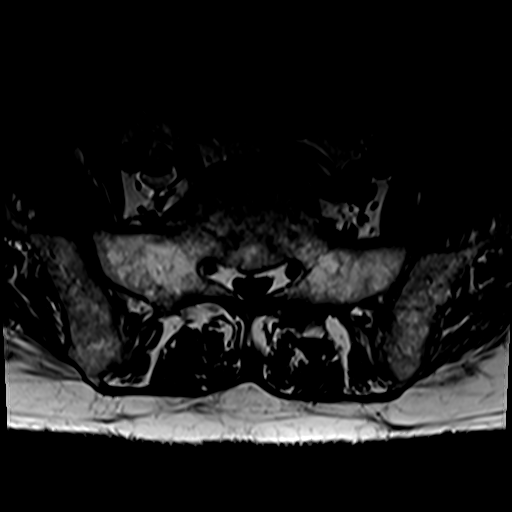
[im 5/29]
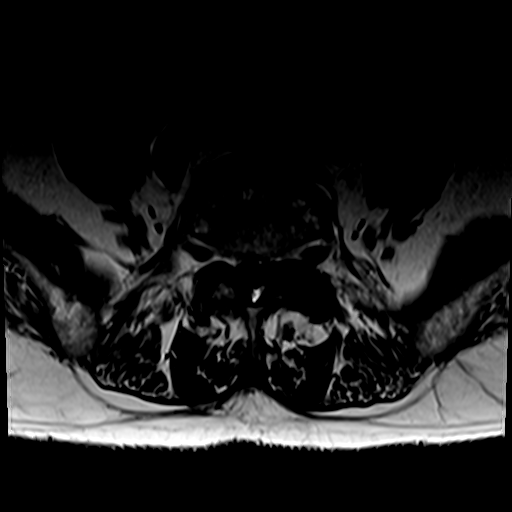
[im 10/29]
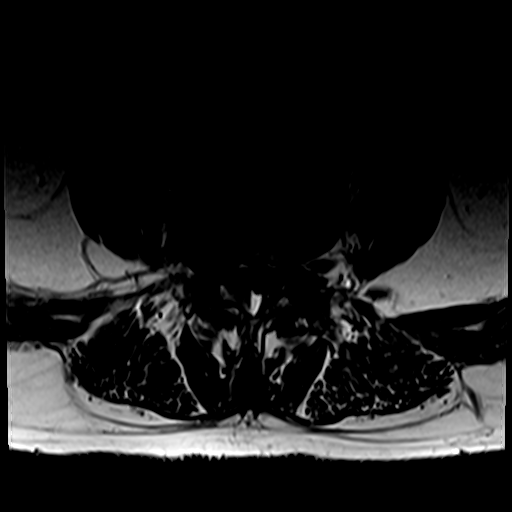
[im 12/29]
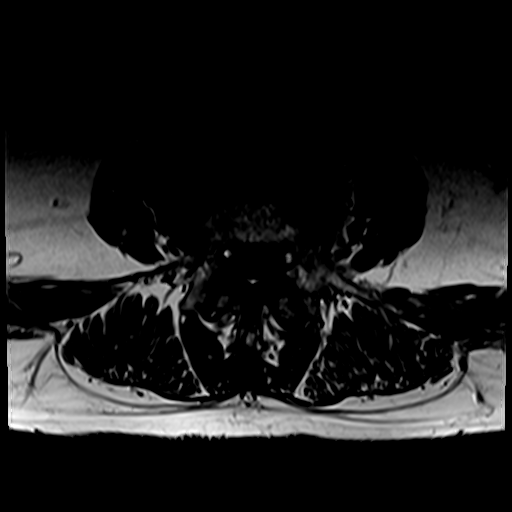
[im 15/29]
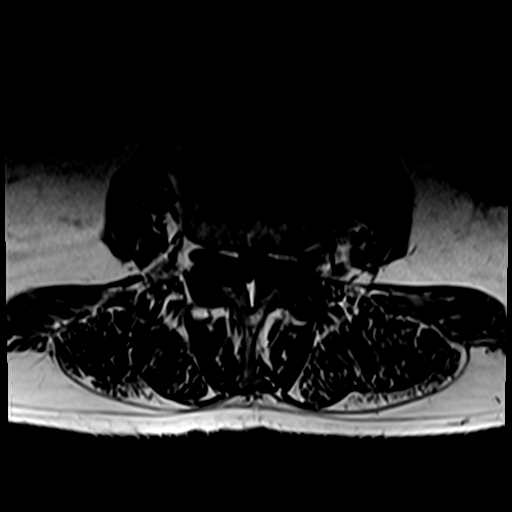
[im 17/29]
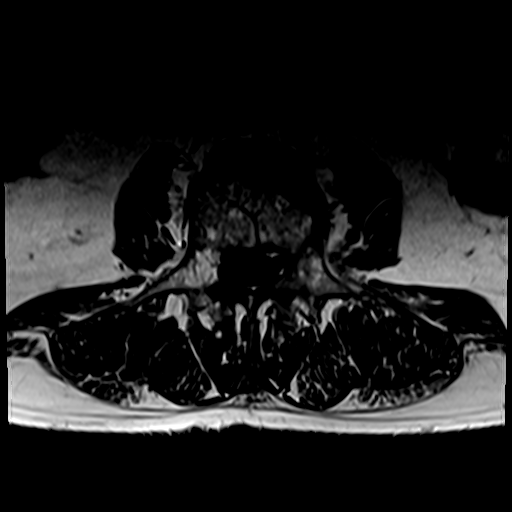
[im 19/29]
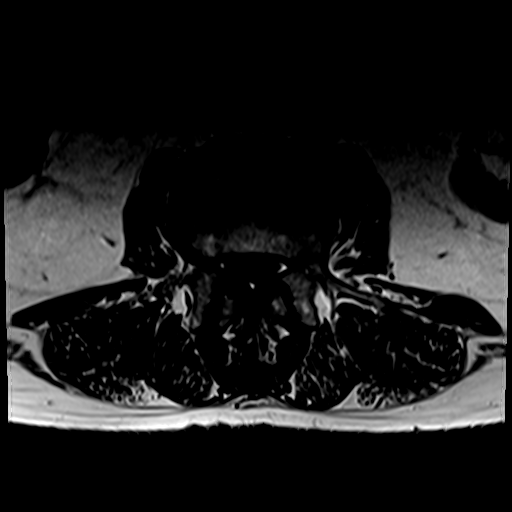
[im 24/29]
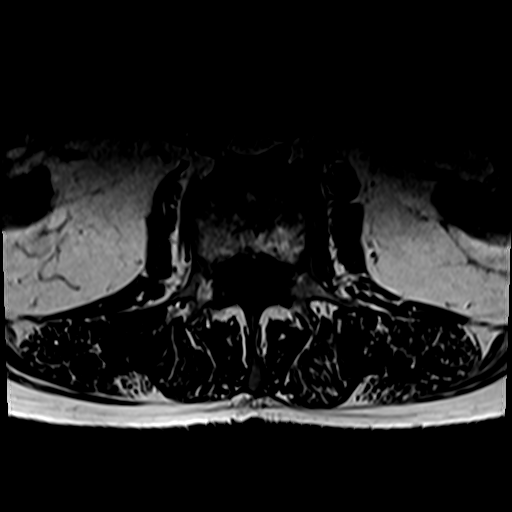

[32 of 48 positions shown; findings below may reference images not displayed]

FINDINGS: Segmentation: Standard; the lowest formed disc space is designated
L5-S1.

Alignment: There is straightening of the normal lumbar lordosis.
There is no antero or retrolisthesis.

Vertebrae: There is compression deformity of the L2 vertebral body
with up to approximately 25% loss of vertebral body height
centrally, similar to the prior CT abdomen/pelvis. There is no
marrow edema. Vertebral body heights are otherwise preserved. There
is degenerative endplate marrow signal abnormality with mild
degenerative edema at L3-L4 and L4-L5. There is no other marrow
edema. There is no suspicious marrow signal abnormality.

Conus medullaris and cauda equina: Conus extends to the T12-L1
level. There is buckling of the cauda equina nerve roots due to the
below described spinal canal stenosis.

Paraspinal and other soft tissues: There is colonic diverticulosis.
The paraspinal soft tissues are unremarkable.

Disc levels:

There is marked disc desiccation and narrowing at L3-L4. There is
disc desiccation without significant loss of height at the other
levels.

There is multilevel facet arthropathy, most advanced on the left at
L5-S1.

T12-L1: No significant spinal canal or neural foraminal stenosis.

L1-L2: There is a mild disc bulge and mild bilateral facet
arthropathy without significant spinal canal or neural foraminal
stenosis.

L2-L3: There is a diffuse disc bulge, degenerative endplate change,
and bilateral facet arthropathy resulting in severe spinal canal
stenosis with compression of the cauda equina nerve roots without
significant neural foraminal stenosis.

L3-L4: There is a diffuse disc bulge, ligamentum flavum thickening,
and bilateral facet arthropathy resulting in severe spinal canal
stenosis with compression of the cauda equina nerve roots and
moderate right and mild left neural foraminal stenosis.

L4-L5: There is a diffuse disc bulge, ligamentum flavum thickening,
and bilateral facet arthropathy resulting in severe spinal canal
stenosis with compression of the cauda equina nerve roots and
moderate to severe right and mild-to-moderate left neural foraminal
stenosis

L5-S1: There is a mild disc bulge and bilateral facet arthropathy
resulting in mild spinal canal stenosis with narrowing of the left
subarticular zone and possible irritation of the traversing S1 nerve
root and mild left and no significant right neural foraminal
stenosis.
IMPRESSION: 1. Degenerative changes in the lumbar spine as detailed above
resulting in severe spinal canal stenosis at L2-L3 through L4-L5,
worse at L3-L4 and L4-L5.
2. Moderate right and mild left neural foraminal stenosis at L3-L4,
and moderate to severe right and mild-to-moderate left neural
foraminal stenosis at L4-L5.
3. Multilevel facet arthropathy, most advanced on the left at L5-S1.
This contributes to narrowing of the left subarticular zone at L5-S1
with possible irritation of the traversing S1 nerve root.
4. Unchanged mild compression deformity of the L2 vertebral body
without bony retropulsion. No evidence of acute injury in the lumbar
spine.

## 2023-08-24 NOTE — Progress Notes (Signed)
 [facility] - Clinic Note  08/24/2023   CHIEF COMPLAINT Patient presents for Post-op Follow-up  HISTORY OF PRESENT ILLNESS: Devin Landry is a 83 y.o. male who presents to the clinic today for:  HPI     Post-op Follow-up   In right eye.  Discomfort includes foreign body sensation.  Vision is improved.  I, the attending physician,  performed the HPI with the patient and updated documentation appropriately.        Comments   2 day S/P Pneumatic retinopexy. Patient states some improvement since tuesday      Last edited by Valdemar Rogue, MD on 08/25/2023  8:16 AM.     Pt states   Referring physician: Karolynn Drivers, OD [facility], [place]  HISTORICAL INFORMATION:  Selected notes from the MEDICAL RECORD NUMBER Referred by Dr. Drivers Karolynn, OD for retinoschisis vs RD OD LEE:  Ocular Hx- pseudophakia OU (Epes) PMH-   CURRENT MEDICATIONS: Current Outpatient Medications (Ophthalmic Drugs)  Medication Sig   neomycin-polymyxin-dexameth (MAXITROL ) 0.1 % OINT Place 1 Application into the right eye 4 (four) times daily.   prednisoLONE  acetate (PRED FORTE ) 1 % ophthalmic suspension Place 1 drop into the left eye 4 (four) times daily.   No current facility-administered medications for this visit. (Ophthalmic Drugs)   Current Outpatient Medications (Other)  Medication Sig   acetaminophen  (TYLENOL ) 650 MG CR tablet Take 650 mg by mouth 2 (two) times daily.   Calcium  Citrate-Vitamin D (CALCIUM  CITRATE + D PO) Take 1 tablet by mouth 2 (two) times daily. Calcium  citrate 500 mg Vitamin D3 400 IU   Cholecalciferol (EQL VITAMIN D3) 25 MCG (1000 UT) capsule Take 1,000 Units by mouth daily as needed.   Corn Dextrin (CLEAR FIBER POWDER PO) Take 1 Dose by mouth daily as needed.   Cyanocobalamin (B-12 PO) Take 1 tablet by mouth 4 (four) times a week.   diphenhydramine-acetaminophen  (TYLENOL  PM) 25-500 MG TABS tablet Take 1 tablet by mouth at bedtime as needed.    docusate sodium  (COLACE) 100 MG capsule Take 100 mg by mouth daily.   Glucosamine-Chondroit-Vit C-Mn (GLUCOSAMINE 1500 COMPLEX PO) Take 1,500 mg by mouth 2 (two) times daily.   Multiple Vitamin (MULTIVITAMIN) tablet Take 1 tablet by mouth daily.   naproxen sodium (ANAPROX) 220 MG tablet Take 220 mg by mouth daily.   oxymetazoline (AFRIN) 0.05 % nasal spray Place 1 spray into both nostrils 2 (two) times daily as needed for congestion.   rosuvastatin  (CRESTOR ) 5 MG tablet Take 5 mg by mouth 3 (three) times a week.   cholestyramine  (QUESTRAN ) 4 GM/DOSE powder Take 4 g by mouth daily as needed (diarrhea). (Patient not taking: Reported on 08/22/2023)   gabapentin  (NEURONTIN ) 300 MG capsule Take 1 capsule by mouth at bedtime.   Meth-Hyo-M Bl-Na Phos-Ph Sal (URIBEL ) 118 MG CAPS Take 1 capsule (118 mg total) by mouth 3 (three) times daily as needed (Urinary frequency, urgency, burning). (Patient not taking: Reported on 08/22/2023)   methocarbamol  (ROBAXIN ) 500 MG tablet Take 1 tablet (500 mg total) by mouth every 8 (eight) hours as needed for muscle spasms. (Patient not taking: Reported on 08/22/2023)   No current facility-administered medications for this visit. (Other)   REVIEW OF SYSTEMS: ROS   Positive for: Musculoskeletal, Eyes Negative for: Constitutional, Gastrointestinal, Neurological, Skin, Genitourinary, HENT, Endocrine, Cardiovascular, Respiratory, Psychiatric, Allergic/Imm, Heme/Lymph Last edited by German Olam BRAVO, COT on 08/24/2023  9:27 AM.      ALLERGIES Allergies  Allergen  Reactions   Food Color Red Anaphylaxis    Red delicious apple skins   Other     Red Delicious Apple skins causes anaphylaxis     Soy Allergy (Obsolete) Other (See Comments)    Headache Other reaction(s): Headache   Zocor [Simvastatin] Other (See Comments)    Leg pain   PAST MEDICAL HISTORY Past Medical History:  Diagnosis Date   Allergic state    Arthritis    BPH (benign prostatic hyperplasia)     Bursitis    hips   Cancer (HCC)    basal and squamous cell   Cataracts, bilateral    ED (erectile dysfunction)    Elevated PSA    Heart murmur    born with mumur-asymptomatic   Hemorrhoids    History of chicken pox    History of colon polyps    History of kidney stones    Hypercholesteremia    Hypogonadism in male    Nephrolithiasis    PIN III (prostatic intraepithelial neoplasia III)    Spinal stenosis of lumbar region    Umbilical hernia    Past Surgical History:  Procedure Laterality Date   ANKLE FRACTURE SURGERY Left 10/10/2007   CARPAL TUNNEL RELEASE Right 2007   CATARACT EXTRACTION, BILATERAL Bilateral    COLONOSCOPY     1993, 1996, 2000, 2005,2010, 2015, 2018   COLONOSCOPY WITH PROPOFOL  N/A 09/21/2016   Procedure: COLONOSCOPY WITH PROPOFOL ;  Surgeon: Viktoria Lamar DASEN, MD;  Location: [facility] ENDOSCOPY;  Service: Endoscopy;  Laterality: N/A;   CYSTOSCOPY W/ RETROGRADES  04/13/2021   Procedure: CYSTOSCOPY WITH RETROGRADE PYELOGRAM;  Surgeon: Twylla Glendia BROCKS, MD;  Location: [facility] ORS;  Service: Urology;;   CYSTOSCOPY WITH INSERTION OF UROLIFT N/A 07/27/2021   Procedure: CYSTOSCOPY WITH INSERTION OF UROLIFT;  Surgeon: Twylla Glendia BROCKS, MD;  Location: [facility] ORS;  Service: Urology;  Laterality: N/A;   CYSTOSCOPY/URETEROSCOPY/HOLMIUM LASER/STENT PLACEMENT Right 04/13/2021   Procedure: CYSTOSCOPY/URETEROSCOPY/HOLMIUM LASER/STENT PLACEMENT;  Surgeon: Twylla Glendia BROCKS, MD;  Location: [facility] ORS;  Service: Urology;  Laterality: Right;   FEMUR FRACTURE SURGERY Left 10/10/2007   FRACTURE SURGERY Left 10/10/2007   ORIF DISTAL RADIUS FRACTURE AND PERTROCHANTERIC FEMUR FRACTURE   HERNIA REPAIR Bilateral 1964   KNEE SURGERY Left 1984   torn meniscus   KNEE SURGERY Right 2005   torn meniscus   LUMBAR LAMINECTOMY/DECOMPRESSION MICRODISCECTOMY N/A 07/02/2021   Procedure: L2-S1 DECOMPRESSION;  Surgeon: Clois Fret, MD;  Location: [facility] ORS;  Service: Neurosurgery;  Laterality: N/A;    FAMILY HISTORY History reviewed. No pertinent family history. SOCIAL HISTORY Social History   Tobacco Use   Smoking status: Former    Types: Pipe    Quit date: 05/15/1978    Years since quitting: 45.3   Smokeless tobacco: Never  Vaping Use   Vaping status: Never Used  Substance Use Topics   Alcohol use: No   Drug use: No       OPHTHALMIC EXAM:  Base Eye Exam     Visual Acuity (Snellen - Linear)       Right Left   Dist cc 20/400 20/25 -3    Correction: Glasses         Tonometry (Tonopen, 9:34 AM)       Right Left   Pressure 9          Neuro/Psych     Oriented x3: Yes   Mood/Affect: Normal         Dilation     Right eye: 1.0% Mydriacyl , 2.5%  Phenylephrine  @ 9:34 AM           Slit Lamp and Fundus Exam     External Exam       Right Left   External Normal Normal         Slit Lamp Exam       Right Left   Lids/Lashes Mild upper lid edema, Dermatochalasis Dermatochalasis, mild MGD   Conjunctiva/Sclera sub conj heme-improved White and quiet   Cornea Well healed cataract wound, tear film debris, fine endo pigment, 1+ Guttata, nasal and temporal LRI Well healed temporal cataract wound, tear film debris, trace PEE, nasal and temporal LRI, 2-3+ Guttata   Anterior Chamber Deep, 1-2+fine cell/pigment Deep, 3+fine cell and pigment   Iris Round and dilated Round and reactive   Lens 3 piece PC IOL in good position w/ open PC 3 piece PC IOL in good position w/ open PC   Anterior Vitreous Vitreous syneresis, +pigment, single gas bubble 25-30% Vitreous syneresis         Fundus Exam       Right Left   Disc Mild pallor, sharp rim Pink and Sharp, Compact   C/D Ratio 0.3 0.3   Macula Flat and reattached Flat, Blunted foveal reflex, RPE mottling, no heme or edema   Vessels Attenuated, mild tortuosity Attenuated, mild tortuosity   Periphery SRF nasal periphery extending to 0630 but staying extra macular. Superior retina reattached under gas bubbles. Pre  OP: Bullous superior detachment from [id number], hole at 1200, pigmented CR scarring at 11 o'clock Attached, scattered pigmented CR scarring at 1200 and temporally, No heme, No edema           Refraction     Wearing Rx       Sphere Cylinder Axis Add   Right -1.25 +1.50 179 +2.50   Left -0.50 +1.00 161 +2.50    Type: Progressive           IMAGING AND PROCEDURES  Imaging and Procedures for 08/24/2023  OCT, Retina - OU - Both Eyes       Right Eye Quality was poor. Central Foveal Thickness: 316. Progression has improved. Findings include normal foveal contour, no IRF, epiretinal membrane, subretinal fluid (interval improvement in SRF--retina reattaching, fovea reattached. Residual SRF temporal periphery caught on widefield).   Left Eye Quality was good. Central Foveal Thickness: 326. Progression has been stable. Findings include normal foveal contour, no IRF, no SRF, epiretinal membrane (Mild ERM w/ blunting of foveal contour. ).   Notes *Images captured and stored on drive  Diagnosis / Impression:  OD: interval improvement in SRF--retina reattaching, fovea reattached. Residual SRF nasal periphery caught on widefield OS: Mild ERM w/ blunting of foveal contour.   Clinical management:  See below  Abbreviations: NFP - Normal foveal profile. CME - cystoid macular edema. PED - pigment epithelial detachment. IRF - intraretinal fluid. SRF - subretinal fluid. EZ - ellipsoid zone. ERM - epiretinal membrane. ORA - outer retinal atrophy. ORT - outer retinal tubulation. SRHM - subretinal hyper-reflective material. IRHM - intraretinal hyper-reflective material           ASSESSMENT/PLAN:   ICD-10-CM   1. Right retinal detachment  H33.21 OCT, Retina - OU - Both Eyes    2. Epiretinal membrane (ERM) of both eyes  H35.373     3. Essential hypertension  I10     4. Hypertensive retinopathy of both eyes  H35.033     5. Pseudophakia of both eyes  Z96.1  Rhegmatogenous retinal  detachment, OD - Bullous superior detachment w/ extension inferiorly through fovea, onset ~08.01.25 by history - s/p pneumatic cryopexy OD on 08.11.25 - OCT OD today shows interval improvement in SRF--retina reattaching, fovea reattached. Residual SRF nasal periphery caught on widefield - positioning guidelines reviewed, continue to hold face down 45 degrees full time; sleep face down - cont Maxitrol  QID OD - cont pred QID OD - f/u Friday, 8.15.25 AM @ 830- POV / DFE, OCT  2. Epiretinal membrane, OU - The natural history, anatomy, potential for loss of vision, and treatment options including vitrectomy techniques and the complications of endophthalmitis, retinal detachment, vitreous hemorrhage, cataract progression and permanent vision loss discussed with the patient. - mild ERM OU - no indication for surgery at this time - monitor for now  3,4. Hypertensive retinopathy OU - discussed importance of tight BP control - monitor   5. Pseudophakia OU  - s/p CE/IOL  - IOL in good position, doing well  - monitor   Ophthalmic Meds Ordered this visit:  No orders of the defined types were placed in this encounter.    Return for Friday 8.15.25 at 830 AM POV RD OD.  There are no Patient Instructions on file for this visit.  Explained the diagnoses, plan, and follow up with the patient and they expressed understanding.  Patient expressed understanding of the importance of proper follow up care.   This document serves as a record of services personally performed by [name], MD, PhD. It was created on their behalf by Avelina Pereyra, COA an ophthalmic technician. The creation of this record is the provider's dictation and/or activities during the visit.   Electronically signed by: Avelina GORMAN Pereyra, COT  08/25/23  8:18 AM   This document serves as a record of services personally performed by [name], MD, PhD. It was created on their behalf by Almetta Pesa, an ophthalmic  technician. The creation of this record is the provider's dictation and/or activities during the visit.    Electronically signed by: Almetta Pesa, OA, 08/25/23  8:18 AM  [name], M.D., Ph.D. Diseases & Surgery of the Retina and Vitreous [facility] 08/24/2023  I have reviewed the above documentation for accuracy and completeness, and I agree with the above. [name], M.D., Ph.D. 08/21/23 8:18 AM   Abbreviations: M myopia (nearsighted); A astigmatism; H hyperopia (farsighted); P presbyopia; Mrx spectacle prescription;  CTL contact lenses; OD right eye; OS left eye; OU both eyes  XT exotropia; ET esotropia; PEK punctate epithelial keratitis; PEE punctate epithelial erosions; DES dry eye syndrome; MGD meibomian gland dysfunction; ATs artificial tears; PFAT's preservative free artificial tears; NSC nuclear sclerotic cataract; PSC posterior subcapsular cataract; ERM epi-retinal membrane; PVD posterior vitreous detachment; RD retinal detachment; DM diabetes mellitus; DR diabetic retinopathy; NPDR non-proliferative diabetic retinopathy; PDR proliferative diabetic retinopathy; CSME clinically significant macular edema; DME diabetic macular edema; dbh dot blot hemorrhages; CWS cotton wool spot; POAG primary open angle glaucoma; C/D cup-to-disc ratio; HVF humphrey visual field; GVF goldmann visual field; OCT optical coherence tomography; IOP intraocular pressure; BRVO Branch retinal vein occlusion; CRVO central retinal vein occlusion; CRAO central retinal artery occlusion; BRAO branch retinal artery occlusion; RT retinal tear; SB scleral buckle; PPV pars plana vitrectomy; VH Vitreous hemorrhage; PRP panretinal laser photocoagulation; IVK intravitreal kenalog ; VMT vitreomacular traction; MH Macular hole;  NVD neovascularization of the disc; NVE neovascularization elsewhere; AREDS age related eye disease study; ARMD age related macular degeneration;  POAG primary open angle  glaucoma; EBMD epithelial/anterior basement membrane dystrophy; ACIOL anterior chamber intraocular lens; IOL intraocular lens; PCIOL posterior chamber intraocular lens; Phaco/IOL phacoemulsification with intraocular lens placement; PRK photorefractive keratectomy; LASIK laser assisted in situ keratomileusis; HTN hypertension; DM diabetes mellitus; COPD chronic obstructive pulmonary disease

## 2023-08-25 ENCOUNTER — Encounter (INDEPENDENT_AMBULATORY_CARE_PROVIDER_SITE_OTHER): Payer: Self-pay | Admitting: Ophthalmology

## 2023-08-25 ENCOUNTER — Ambulatory Visit (INDEPENDENT_AMBULATORY_CARE_PROVIDER_SITE_OTHER): Admitting: Ophthalmology

## 2023-08-25 DIAGNOSIS — H35373 Puckering of macula, bilateral: Secondary | ICD-10-CM | POA: Diagnosis not present

## 2023-08-25 DIAGNOSIS — H35033 Hypertensive retinopathy, bilateral: Secondary | ICD-10-CM | POA: Diagnosis not present

## 2023-08-25 DIAGNOSIS — H3321 Serous retinal detachment, right eye: Secondary | ICD-10-CM

## 2023-08-25 DIAGNOSIS — I10 Essential (primary) hypertension: Secondary | ICD-10-CM

## 2023-08-25 DIAGNOSIS — Z961 Presence of intraocular lens: Secondary | ICD-10-CM

## 2023-08-25 NOTE — Progress Notes (Signed)
 [facility] - Clinic Note  08/25/2023   CHIEF COMPLAINT Patient presents for Post-op Follow-up  HISTORY OF PRESENT ILLNESS: Devin Landry is a 83 y.o. male who presents to the clinic today for:  HPI     Post-op Follow-up   In right eye (Pneumatic cryopexy).  Discomfort includes none.  Negative for pain, itching, foreign body sensation, tearing, discharge and floaters.  Vision is improved.  I, the attending physician,  performed the HPI with the patient and updated documentation appropriately.        Comments   4 day S/P Pneumatic retinopexy. Patient states he thinks there is improvement in vision. Pt denies FOL/floaters/pain. Pt is consistent with Maxitrol  QID OD and pred QID OD. Pt also uses ATS OU BID.       Last edited by Valdemar Rogue, MD on 08/27/2023  8:47 PM.     Pt states he does feel VA OD is a bit clearer.   Referring physician: Karolynn Drivers, OD [facility], [place]  HISTORICAL INFORMATION:  Selected notes from the MEDICAL RECORD NUMBER Referred by Dr. Drivers Karolynn, OD for retinoschisis vs RD OD LEE:  Ocular Hx- pseudophakia OU (Epes) PMH-   CURRENT MEDICATIONS: Current Outpatient Medications (Ophthalmic Drugs)  Medication Sig   neomycin-polymyxin-dexameth (MAXITROL ) 0.1 % OINT Place 1 Application into the right eye 4 (four) times daily.   prednisoLONE  acetate (PRED FORTE ) 1 % ophthalmic suspension Place 1 drop into the left eye 4 (four) times daily.   No current facility-administered medications for this visit. (Ophthalmic Drugs)   Current Outpatient Medications (Other)  Medication Sig   acetaminophen  (TYLENOL ) 650 MG CR tablet Take 650 mg by mouth 2 (two) times daily.   Calcium  Citrate-Vitamin D (CALCIUM  CITRATE + D PO) Take 1 tablet by mouth 2 (two) times daily. Calcium  citrate 500 mg Vitamin D3 400 IU   Cholecalciferol (EQL VITAMIN D3) 25 MCG (1000 UT) capsule Take 1,000 Units by mouth daily as needed.   cholestyramine   (QUESTRAN ) 4 GM/DOSE powder Take 4 g by mouth daily as needed (diarrhea). (Patient not taking: Reported on 08/22/2023)   Corn Dextrin (CLEAR FIBER POWDER PO) Take 1 Dose by mouth daily as needed.   Cyanocobalamin (B-12 PO) Take 1 tablet by mouth 4 (four) times a week.   diphenhydramine-acetaminophen  (TYLENOL  PM) 25-500 MG TABS tablet Take 1 tablet by mouth at bedtime as needed.   docusate sodium  (COLACE) 100 MG capsule Take 100 mg by mouth daily.   gabapentin  (NEURONTIN ) 300 MG capsule Take 1 capsule by mouth at bedtime.   Glucosamine-Chondroit-Vit C-Mn (GLUCOSAMINE 1500 COMPLEX PO) Take 1,500 mg by mouth 2 (two) times daily.   Meth-Hyo-M Bl-Na Phos-Ph Sal (URIBEL ) 118 MG CAPS Take 1 capsule (118 mg total) by mouth 3 (three) times daily as needed (Urinary frequency, urgency, burning). (Patient not taking: Reported on 08/22/2023)   methocarbamol  (ROBAXIN ) 500 MG tablet Take 1 tablet (500 mg total) by mouth every 8 (eight) hours as needed for muscle spasms. (Patient not taking: Reported on 08/22/2023)   Multiple Vitamin (MULTIVITAMIN) tablet Take 1 tablet by mouth daily.   naproxen sodium (ANAPROX) 220 MG tablet Take 220 mg by mouth daily.   oxymetazoline (AFRIN) 0.05 % nasal spray Place 1 spray into both nostrils 2 (two) times daily as needed for congestion.   rosuvastatin  (CRESTOR ) 5 MG tablet Take 5 mg by mouth 3 (three) times a week.   No current facility-administered medications for this visit. (Other)  REVIEW OF SYSTEMS: ROS   Positive for: Musculoskeletal, Eyes Negative for: Constitutional, Gastrointestinal, Neurological, Skin, Genitourinary, HENT, Endocrine, Cardiovascular, Respiratory, Psychiatric, Allergic/Imm, Heme/Lymph Last edited by Elnor Avelina RAMAN, COT on 08/25/2023  8:03 AM.      ALLERGIES Allergies  Allergen Reactions   Food Color Red Anaphylaxis    Red delicious apple skins   Other     Red Delicious Apple skins causes anaphylaxis     Soy Allergy (Obsolete) Other (See  Comments)    Headache Other reaction(s): Headache   Zocor [Simvastatin] Other (See Comments)    Leg pain   PAST MEDICAL HISTORY Past Medical History:  Diagnosis Date   Allergic state    Arthritis    BPH (benign prostatic hyperplasia)    Bursitis    hips   Cancer (HCC)    basal and squamous cell   Cataracts, bilateral    ED (erectile dysfunction)    Elevated PSA    Heart murmur    born with mumur-asymptomatic   Hemorrhoids    History of chicken pox    History of colon polyps    History of kidney stones    Hypercholesteremia    Hypogonadism in male    Nephrolithiasis    PIN III (prostatic intraepithelial neoplasia III)    Spinal stenosis of lumbar region    Umbilical hernia    Past Surgical History:  Procedure Laterality Date   ANKLE FRACTURE SURGERY Left 10/10/2007   CARPAL TUNNEL RELEASE Right 2007   CATARACT EXTRACTION, BILATERAL Bilateral    COLONOSCOPY     1993, 1996, 2000, 2005,2010, 2015, 2018   COLONOSCOPY WITH PROPOFOL  N/A 09/21/2016   Procedure: COLONOSCOPY WITH PROPOFOL ;  Surgeon: Viktoria Lamar DASEN, MD;  Location: [facility] ENDOSCOPY;  Service: Endoscopy;  Laterality: N/A;   CYSTOSCOPY W/ RETROGRADES  04/13/2021   Procedure: CYSTOSCOPY WITH RETROGRADE PYELOGRAM;  Surgeon: Twylla Glendia BROCKS, MD;  Location: [facility] ORS;  Service: Urology;;   CYSTOSCOPY WITH INSERTION OF UROLIFT N/A 07/27/2021   Procedure: CYSTOSCOPY WITH INSERTION OF UROLIFT;  Surgeon: Twylla Glendia BROCKS, MD;  Location: [facility] ORS;  Service: Urology;  Laterality: N/A;   CYSTOSCOPY/URETEROSCOPY/HOLMIUM LASER/STENT PLACEMENT Right 04/13/2021   Procedure: CYSTOSCOPY/URETEROSCOPY/HOLMIUM LASER/STENT PLACEMENT;  Surgeon: Twylla Glendia BROCKS, MD;  Location: [facility] ORS;  Service: Urology;  Laterality: Right;   FEMUR FRACTURE SURGERY Left 10/10/2007   FRACTURE SURGERY Left 10/10/2007   ORIF DISTAL RADIUS FRACTURE AND PERTROCHANTERIC FEMUR FRACTURE   HERNIA REPAIR Bilateral 1964   KNEE SURGERY Left 1984   torn meniscus    KNEE SURGERY Right 2005   torn meniscus   LUMBAR LAMINECTOMY/DECOMPRESSION MICRODISCECTOMY N/A 07/02/2021   Procedure: L2-S1 DECOMPRESSION;  Surgeon: Clois Fret, MD;  Location: [facility] ORS;  Service: Neurosurgery;  Laterality: N/A;   FAMILY HISTORY History reviewed. No pertinent family history. SOCIAL HISTORY Social History   Tobacco Use   Smoking status: Former    Types: Pipe    Quit date: 05/15/1978    Years since quitting: 45.3   Smokeless tobacco: Never  Vaping Use   Vaping status: Never Used  Substance Use Topics   Alcohol use: No   Drug use: No       OPHTHALMIC EXAM:  Base Eye Exam     Visual Acuity (Snellen - Linear)       Right Left   Dist cc 20/800 20/30   Dist ph cc NI NI    Correction: Glasses         Tonometry (Tonopen, 8:07 AM)  Right Left   Pressure 14 15         Pupils       Pupils Dark Light Shape React APD   Right PERRL 3 2 Round Minimal None   Left PERRL 3 2 Round Minimal None         Visual Fields       Left Right    Full Full         Extraocular Movement       Right Left    Full, Ortho Full, Ortho         Neuro/Psych     Oriented x3: Yes   Mood/Affect: Normal         Dilation     Both eyes: 1.0% Mydriacyl , 2.5% Phenylephrine  @ 8:12 AM           Slit Lamp and Fundus Exam     External Exam       Right Left   External Normal Normal         Slit Lamp Exam       Right Left   Lids/Lashes Mild upper lid edema, Dermatochalasis Dermatochalasis, mild MGD   Conjunctiva/Sclera sub conj heme-improved White and quiet   Cornea Well healed cataract wound, tear film debris, fine endo pigment, 1+ Guttata, nasal and temporal LRI Well healed temporal cataract wound, tear film debris, trace PEE, nasal and temporal LRI, 2-3+ Guttata   Anterior Chamber Deep, 1-2+fine cell/pigment Deep, 3+fine cell and pigment   Iris Round and dilated Round and reactive   Lens 3 piece PC IOL in good position w/ open PC 3  piece PC IOL in good position w/ open PC   Anterior Vitreous Vitreous syneresis, +pigment, single gas bubble 30% Vitreous syneresis         Fundus Exam       Right Left   Disc Mild pallor, sharp rim Pink and Sharp, Compact   C/D Ratio 0.3 0.3   Macula Flat and reattached Flat, Blunted foveal reflex, RPE mottling, no heme or edema   Vessels Attenuated, mild tortuosity Attenuated, mild tortuosity   Periphery Persistent SRF nasal periphery extending to 0630 but staying extra macular. Superior retina reattached under gas bubble. Pre OP: Bullous superior detachment from [phone], hole at 1200, pigmented CR scarring at 11 o'clock Attached, scattered pigmented CR scarring at 1200 and temporally, No heme, No edema           Refraction     Wearing Rx       Sphere Cylinder Axis Add   Right -1.25 +1.50 179 +2.50   Left -0.50 +1.00 161 +2.50    Type: Progressive           IMAGING AND PROCEDURES  Imaging and Procedures for 08/25/2023  OCT, Retina - OU - Both Eyes       Right Eye Quality was poor. Central Foveal Thickness: 290. Progression has been stable. Findings include normal foveal contour, no IRF, epiretinal membrane, subretinal fluid (interval improvement in SRF--retina reattaching, fovea reattached. Residual SRF nasal and inferior to disc caught on widefield).   Left Eye Quality was good. Central Foveal Thickness: 326. Progression has been stable. Findings include normal foveal contour, no IRF, no SRF, epiretinal membrane (Mild ERM w/ blunting of foveal contour. ).   Notes *Images captured and stored on drive  Diagnosis / Impression:  OD: stable improvement in SRF centrally--retina reattaching, fovea reattached. Residual SRF nasal and inferior to disc caught on widefield -- stable  OS: Mild ERM w/ blunting of foveal contour.   Clinical management:  See below  Abbreviations: NFP - Normal foveal profile. CME - cystoid macular edema. PED - pigment epithelial detachment.  IRF - intraretinal fluid. SRF - subretinal fluid. EZ - ellipsoid zone. ERM - epiretinal membrane. ORA - outer retinal atrophy. ORT - outer retinal tubulation. SRHM - subretinal hyper-reflective material. IRHM - intraretinal hyper-reflective material            ASSESSMENT/PLAN:   ICD-10-CM   1. Right retinal detachment  H33.21 OCT, Retina - OU - Both Eyes    2. Epiretinal membrane (ERM) of both eyes  H35.373     3. Essential hypertension  I10     4. Hypertensive retinopathy of both eyes  H35.033     5. Pseudophakia of both eyes  Z96.1      Rhegmatogenous retinal detachment, OD - Bullous superior detachment w/ extension inferiorly through fovea, onset ~08.01.25 by history - s/p pneumatic cryopexy OD on 08.11.25 - OCT OD today shows stable improvement in central SRF-- fovea reattached. Residual SRF nasal and inferior to disc caught on widefield -- stable - positioning guidelines reviewed, continue to hold face down 45 degrees full time while awake and while sleeping - cont Maxitrol  QID OD - cont pred QID OD - f/u Monday 8.18.25 @8AM  POV / DFE, OCT  2. Epiretinal membrane, OU - The natural history, anatomy, potential for loss of vision, and treatment options including vitrectomy techniques and the complications of endophthalmitis, retinal detachment, vitreous hemorrhage, cataract progression and permanent vision loss discussed with the patient. - mild ERM OU - no indication for surgery at this time - monitor for now  3,4. Hypertensive retinopathy OU - discussed importance of tight BP control - monitor   5. Pseudophakia OU  - s/p CE/IOL  - IOL in good position, doing well  - monitor   Ophthalmic Meds Ordered this visit:  No orders of the defined types were placed in this encounter.    Return in about 3 days (around 08/28/2023) for 8AM for RD OD POV, DFE, OCT.  There are no Patient Instructions on file for this visit.  Explained the diagnoses, plan, and follow up with the  patient and they expressed understanding.  Patient expressed understanding of the importance of proper follow up care.   This document serves as a record of services personally performed by [name], MD, PhD. It was created on their behalf by Avelina Pereyra, COA an ophthalmic technician. The creation of this record is the provider's dictation and/or activities during the visit.   Electronically signed by: Avelina GORMAN Pereyra, COT  08/27/23  8:51 PM   This document serves as a record of services personally performed by [name], MD, PhD. It was created on their behalf by Almetta Pesa, an ophthalmic technician. The creation of this record is the provider's dictation and/or activities during the visit.    Electronically signed by: Almetta Pesa, OA, 08/27/23  8:51 PM  [name], M.D., Ph.D. Diseases & Surgery of the Retina and Vitreous [facility] 08/25/2023  I have reviewed the above documentation for accuracy and completeness, and I agree with the above. [name], M.D., Ph.D. 08/27/23 9:03 PM  Abbreviations: M myopia (nearsighted); A astigmatism; H hyperopia (farsighted); P presbyopia; Mrx spectacle prescription;  CTL contact lenses; OD right eye; OS left eye; OU both eyes  XT exotropia; ET esotropia; PEK punctate epithelial keratitis; PEE punctate  epithelial erosions; DES dry eye syndrome; MGD meibomian gland dysfunction; ATs artificial tears; PFAT's preservative free artificial tears; NSC nuclear sclerotic cataract; PSC posterior subcapsular cataract; ERM epi-retinal membrane; PVD posterior vitreous detachment; RD retinal detachment; DM diabetes mellitus; DR diabetic retinopathy; NPDR non-proliferative diabetic retinopathy; PDR proliferative diabetic retinopathy; CSME clinically significant macular edema; DME diabetic macular edema; dbh dot blot hemorrhages; CWS cotton wool spot; POAG primary open angle glaucoma; C/D cup-to-disc ratio; HVF  humphrey visual field; GVF goldmann visual field; OCT optical coherence tomography; IOP intraocular pressure; BRVO Branch retinal vein occlusion; CRVO central retinal vein occlusion; CRAO central retinal artery occlusion; BRAO branch retinal artery occlusion; RT retinal tear; SB scleral buckle; PPV pars plana vitrectomy; VH Vitreous hemorrhage; PRP panretinal laser photocoagulation; IVK intravitreal kenalog ; VMT vitreomacular traction; MH Macular hole;  NVD neovascularization of the disc; NVE neovascularization elsewhere; AREDS age related eye disease study; ARMD age related macular degeneration; POAG primary open angle glaucoma; EBMD epithelial/anterior basement membrane dystrophy; ACIOL anterior chamber intraocular lens; IOL intraocular lens; PCIOL posterior chamber intraocular lens; Phaco/IOL phacoemulsification with intraocular lens placement; PRK photorefractive keratectomy; LASIK laser assisted in situ keratomileusis; HTN hypertension; DM diabetes mellitus; COPD chronic obstructive pulmonary disease

## 2023-08-27 ENCOUNTER — Encounter (INDEPENDENT_AMBULATORY_CARE_PROVIDER_SITE_OTHER): Payer: Self-pay | Admitting: Ophthalmology

## 2023-08-28 ENCOUNTER — Encounter (INDEPENDENT_AMBULATORY_CARE_PROVIDER_SITE_OTHER): Payer: Self-pay | Admitting: Ophthalmology

## 2023-08-28 ENCOUNTER — Ambulatory Visit (INDEPENDENT_AMBULATORY_CARE_PROVIDER_SITE_OTHER): Admitting: Ophthalmology

## 2023-08-28 DIAGNOSIS — H35373 Puckering of macula, bilateral: Secondary | ICD-10-CM | POA: Diagnosis not present

## 2023-08-28 DIAGNOSIS — I10 Essential (primary) hypertension: Secondary | ICD-10-CM

## 2023-08-28 DIAGNOSIS — H3321 Serous retinal detachment, right eye: Secondary | ICD-10-CM

## 2023-08-28 DIAGNOSIS — H35033 Hypertensive retinopathy, bilateral: Secondary | ICD-10-CM | POA: Diagnosis not present

## 2023-08-28 DIAGNOSIS — Z961 Presence of intraocular lens: Secondary | ICD-10-CM

## 2023-08-28 NOTE — Progress Notes (Signed)
 [facility] - Clinic Note  08/28/2023   CHIEF COMPLAINT Patient presents for Post-op Follow-up  HISTORY OF PRESENT ILLNESS: Devin Landry is a 83 y.o. male who presents to the clinic today for:  HPI     Post-op Follow-up   In right eye (Pneumatic cryopexy).  Discomfort includes none.  Negative for pain, itching, foreign body sensation, tearing, discharge and floaters.  Vision is improved.  I, the attending physician,  performed the HPI with the patient and updated documentation appropriately.        Comments   7 day S/P Pneumatic retinopexy. Patient states the bubble in his eye seems smaller, hard to tell if vision is any better because of all the ointment. Pt denies FOL/floaters/pain. Pt is consistent with Maxitrol  QID OD and pred QID OD. Pt also uses ATS OU BID.       Last edited by Valdemar Rogue, MD on 08/28/2023  8:15 AM.     Pt states he's doing alright, would prefer to sleep face down-bed is probably 40 degree angle.   Referring physician: Karolynn Drivers, OD [facility], [place]  HISTORICAL INFORMATION:  Selected notes from the MEDICAL RECORD NUMBER Referred by Dr. Drivers Karolynn, OD for retinoschisis vs RD OD LEE:  Ocular Hx- pseudophakia OU (Epes) PMH-   CURRENT MEDICATIONS: Current Outpatient Medications (Ophthalmic Drugs)  Medication Sig   neomycin-polymyxin-dexameth (MAXITROL ) 0.1 % OINT Place 1 Application into the right eye 4 (four) times daily.   prednisoLONE  acetate (PRED FORTE ) 1 % ophthalmic suspension Place 1 drop into the left eye 4 (four) times daily.   No current facility-administered medications for this visit. (Ophthalmic Drugs)   Current Outpatient Medications (Other)  Medication Sig   acetaminophen  (TYLENOL ) 650 MG CR tablet Take 650 mg by mouth 2 (two) times daily.   Calcium  Citrate-Vitamin D (CALCIUM  CITRATE + D PO) Take 1 tablet by mouth 2 (two) times daily. Calcium  citrate 500 mg Vitamin D3 400 IU    Cholecalciferol (EQL VITAMIN D3) 25 MCG (1000 UT) capsule Take 1,000 Units by mouth daily as needed.   cholestyramine  (QUESTRAN ) 4 GM/DOSE powder Take 4 g by mouth daily as needed (diarrhea). (Patient not taking: Reported on 08/22/2023)   Corn Dextrin (CLEAR FIBER POWDER PO) Take 1 Dose by mouth daily as needed.   Cyanocobalamin (B-12 PO) Take 1 tablet by mouth 4 (four) times a week.   diphenhydramine-acetaminophen  (TYLENOL  PM) 25-500 MG TABS tablet Take 1 tablet by mouth at bedtime as needed.   docusate sodium  (COLACE) 100 MG capsule Take 100 mg by mouth daily.   gabapentin  (NEURONTIN ) 300 MG capsule Take 1 capsule by mouth at bedtime.   Glucosamine-Chondroit-Vit C-Mn (GLUCOSAMINE 1500 COMPLEX PO) Take 1,500 mg by mouth 2 (two) times daily.   Meth-Hyo-M Bl-Na Phos-Ph Sal (URIBEL ) 118 MG CAPS Take 1 capsule (118 mg total) by mouth 3 (three) times daily as needed (Urinary frequency, urgency, burning). (Patient not taking: Reported on 08/22/2023)   methocarbamol  (ROBAXIN ) 500 MG tablet Take 1 tablet (500 mg total) by mouth every 8 (eight) hours as needed for muscle spasms. (Patient not taking: Reported on 08/22/2023)   Multiple Vitamin (MULTIVITAMIN) tablet Take 1 tablet by mouth daily.   naproxen sodium (ANAPROX) 220 MG tablet Take 220 mg by mouth daily.   oxymetazoline (AFRIN) 0.05 % nasal spray Place 1 spray into both nostrils 2 (two) times daily as needed for congestion.   rosuvastatin  (CRESTOR ) 5 MG tablet Take 5 mg by  mouth 3 (three) times a week.   No current facility-administered medications for this visit. (Other)   REVIEW OF SYSTEMS: ROS   Positive for: Musculoskeletal, Eyes Negative for: Constitutional, Gastrointestinal, Neurological, Skin, Genitourinary, HENT, Endocrine, Cardiovascular, Respiratory, Psychiatric, Allergic/Imm, Heme/Lymph Last edited by Elnor Avelina RAMAN, COT on 08/28/2023  8:05 AM.      ALLERGIES Allergies  Allergen Reactions   Food Color Red Anaphylaxis    Red  delicious apple skins   Other     Red Delicious Apple skins causes anaphylaxis     Soy Allergy (Obsolete) Other (See Comments)    Headache Other reaction(s): Headache   Zocor [Simvastatin] Other (See Comments)    Leg pain   PAST MEDICAL HISTORY Past Medical History:  Diagnosis Date   Allergic state    Arthritis    BPH (benign prostatic hyperplasia)    Bursitis    hips   Cancer (HCC)    basal and squamous cell   Cataracts, bilateral    ED (erectile dysfunction)    Elevated PSA    Heart murmur    born with mumur-asymptomatic   Hemorrhoids    History of chicken pox    History of colon polyps    History of kidney stones    Hypercholesteremia    Hypogonadism in male    Nephrolithiasis    PIN III (prostatic intraepithelial neoplasia III)    Spinal stenosis of lumbar region    Umbilical hernia    Past Surgical History:  Procedure Laterality Date   ANKLE FRACTURE SURGERY Left 10/10/2007   CARPAL TUNNEL RELEASE Right 2007   CATARACT EXTRACTION, BILATERAL Bilateral    COLONOSCOPY     1993, 1996, 2000, 2005,2010, 2015, 2018   COLONOSCOPY WITH PROPOFOL  N/A 09/21/2016   Procedure: COLONOSCOPY WITH PROPOFOL ;  Surgeon: Viktoria Lamar DASEN, MD;  Location: [facility] END[facility]COPY;  Service: Endoscopy;  Laterality: N/A;   CYSTOSCOPY W/ RETROGRADES  04/13/2021   Procedure: CYSTOSCOPY WITH RETROGRADE PYELOGRAM;  Surgeon: Twylla Glendia BROCKS, MD;  Location: [facility] ORS;  Service: Urology;;   CYSTOSCOPY WITH INSERTION OF UROLIFT N/A 07/27/2021   Procedure: CYSTOSCOPY WITH INSERTION OF UROLIFT;  Surgeon: Twylla Glendia BROCKS, MD;  Location: [facility] ORS;  Service: Urology;  Laterality: N/A;   CYSTOSCOPY/URETEROSCOPY/HOLMIUM LASER/STENT PLACEMENT Right 04/13/2021   Procedure: CYSTOSCOPY/URETEROSCOPY/HOLMIUM LASER/STENT PLACEMENT;  Surgeon: Twylla Glendia BROCKS, MD;  Location: [facility] ORS;  Service: Urology;  Laterality: Right;   FEMUR FRACTURE SURGERY Left 10/10/2007   FRACTURE SURGERY Left 10/10/2007   ORIF DISTAL  RADIUS FRACTURE AND PERTROCHANTERIC FEMUR FRACTURE   HERNIA REPAIR Bilateral 1964   KNEE SURGERY Left 1984   torn meniscus   KNEE SURGERY Right 2005   torn meniscus   LUMBAR LAMINECTOMY/DECOMPRESSION MICRODISCECTOMY N/A 07/02/2021   Procedure: L2-S1 DECOMPRESSION;  Surgeon: Clois Fret, MD;  Location: [facility] ORS;  Service: Neurosurgery;  Laterality: N/A;   FAMILY HISTORY History reviewed. No pertinent family history. SOCIAL HISTORY Social History   Tobacco Use   Smoking status: Former    Types: Pipe    Quit date: 05/15/1978    Years since quitting: 45.3   Smokeless tobacco: Never  Vaping Use   Vaping status: Never Used  Substance Use Topics   Alcohol use: No   Drug use: No       OPHTHALMIC EXAM:  Base Eye Exam     Visual Acuity (Snellen - Linear)       Right Left   Dist cc 20/400 +2 20/25 -2   Dist ph  cc NI     Correction: Glasses         Pachymetry (08/28/2023)       Right Left   Thickness 9 17         Pupils       Pupils Dark Light Shape React APD   Right PERRL 3 3 Round Minimal None   Left PERRL 3 2 Round Brisk None         Visual Fields       Left Right    Full Full         Extraocular Movement       Right Left    Full, Ortho Full, Ortho         Neuro/Psych     Oriented x3: Yes   Mood/Affect: Normal         Dilation     Right eye: 1.0% Mydriacyl , 2.5% Phenylephrine  @ 8:03 AM           Slit Lamp and Fundus Exam     External Exam       Right Left   External Normal Normal         Slit Lamp Exam       Right Left   Lids/Lashes Mild upper lid edema, Dermatochalasis Dermatochalasis, mild MGD   Conjunctiva/Sclera sub conj heme-improved White and quiet   Cornea Well healed cataract wound, tear film debris, fine endo pigment, 1+ Guttata, nasal and temporal LRI Well healed temporal cataract wound, tear film debris, trace PEE, nasal and temporal LRI, 2-3+ Guttata   Anterior Chamber Deep, 1-2+fine cell/pigment Deep,  3+fine cell and pigment   Iris Round and dilated Round and reactive   Lens 3 piece PC IOL in good position w/ open PC 3 piece PC IOL in good position w/ open PC   Anterior Vitreous Vitreous syneresis, +pigment, single gas bubble 30%, scattered debris Vitreous syneresis         Fundus Exam       Right Left   Disc Mild pallor, sharp rim Pink and Sharp, Compact   C/D Ratio 0.3 0.3   Macula Flat and reattached Flat, Blunted foveal reflex, RPE mottling, no heme or edema   Vessels Attenuated, mild tortuosity Attenuated, mild tortuosity   Periphery Persistent SRF nasal periphery extending to 0630. Superior retina reattached under gas bubble. Pre OP: Bullous superior detachment from [phone], hole at 1200, pigmented CR scarring at 11 o'clock Attached, scattered pigmented CR scarring at 1200 and temporally, No heme, No edema           Refraction     Wearing Rx       Sphere Cylinder Axis Add   Right -1.25 +1.50 179 +2.50   Left -0.50 +1.00 161 +2.50    Type: Progressive           IMAGING AND PROCEDURES  Imaging and Procedures for 08/28/2023  OCT, Retina - OU - Both Eyes       Right Eye Quality was poor. Central Foveal Thickness: 289. Progression has worsened. Findings include normal foveal contour, no IRF, epiretinal membrane, subretinal fluid (Stable improvement in central SRF-- fovea reattached. Residual SRF nasal and inferior to disc caught on widefield--slightly increased. ).   Left Eye Quality was good. Central Foveal Thickness: 329. Progression has been stable. Findings include normal foveal contour, no IRF, no SRF, epiretinal membrane (Mild ERM w/ blunting of foveal contour. ).   Notes *Images captured and stored on drive  Diagnosis /  Impression:  OD: Stable improvement in central SRF-- fovea reattached. Residual SRF nasal and inferior to disc caught on widefield--slightly increased.  OS: Mild ERM w/ blunting of foveal contour.   Clinical management:  See  below  Abbreviations: NFP - Normal foveal profile. CME - cystoid macular edema. PED - pigment epithelial detachment. IRF - intraretinal fluid. SRF - subretinal fluid. EZ - ellipsoid zone. ERM - epiretinal membrane. ORA - outer retinal atrophy. ORT - outer retinal tubulation. SRHM - subretinal hyper-reflective material. IRHM - intraretinal hyper-reflective material            ASSESSMENT/PLAN:   ICD-10-CM   1. Right retinal detachment  H33.21 OCT, Retina - OU - Both Eyes    2. Epiretinal membrane (ERM) of both eyes  H35.373 OCT, Retina - OU - Both Eyes    3. Essential hypertension  I10     4. Hypertensive retinopathy of both eyes  H35.033     5. Pseudophakia of both eyes  Z96.1      Rhegmatogenous retinal detachment, OD - Bullous superior detachment w/ extension inferiorly through fovea, onset ~08.01.25 by history - s/p pneumatic cryopexy OD on 08.11.25 - OCT OD today shows stable improvement in central SRF-- fovea reattached. Residual SRF nasal and inferior to disc caught on widefield--slightly increased.  - positioning guidelines reviewed, continue to hold face down 45 degrees while awake and face down while sleeping - cont Maxitrol  QID OD - cont pred QID OD - discussed findings, prognosis - recommend 25g PPV w/ endolaser and gas this Thursday, 08.21.25, for definitive repair if not improving on Wednesday - RBA of procedure discussed, questions answered - informed consent obtained and signed - f/u Wednesday08.20.25 @8AM -likely surgical planning, DFE, OCT  2. Epiretinal membrane, OU - The natural history, anatomy, potential for loss of vision, and treatment options including vitrectomy techniques and the complications of endophthalmitis, retinal detachment, vitreous hemorrhage, cataract progression and permanent vision loss discussed with the patient. - mild ERM OU - no indication for surgery at this time - monitor for now  3,4. Hypertensive retinopathy OU - discussed  importance of tight BP control - monitor   5. Pseudophakia OU  - s/p CE/IOL  - IOL in good position, doing well  - monitor   Ophthalmic Meds Ordered this visit:  No orders of the defined types were placed in this encounter.    Return in about 2 days (around 08/30/2023) for @ 8am, RD OD, DFE, OCT.  There are no Patient Instructions on file for this visit.  Explained the diagnoses, plan, and follow up with the patient and they expressed understanding.  Patient expressed understanding of the importance of proper follow up care.   This document serves as a record of services personally performed by [name], MD, PhD. It was created on their behalf by Avelina Pereyra, COA an ophthalmic technician. The creation of this record is the provider's dictation and/or activities during the visit.   Electronically signed by: Avelina GORMAN Pereyra, COT  08/29/23  8:51 AM   This document serves as a record of services personally performed by [name], MD, PhD. It was created on their behalf by Almetta Pesa, an ophthalmic technician. The creation of this record is the provider's dictation and/or activities during the visit.    Electronically signed by: Almetta Pesa, OA, 08/29/23  8:51 AM  [name], M.D., Ph.D. Diseases & Surgery of the Retina and Vitreous [facility] 08/28/2023  I  have reviewed the above documentation for accuracy and completeness, and I agree with the above. [name], M.D., Ph.D. 08/28/23 8:51 AM  Abbreviations: M myopia (nearsighted); A astigmatism; H hyperopia (farsighted); P presbyopia; Mrx spectacle prescription;  CTL contact lenses; OD right eye; OS left eye; OU both eyes  XT exotropia; ET esotropia; PEK punctate epithelial keratitis; PEE punctate epithelial erosions; DES dry eye syndrome; MGD meibomian gland dysfunction; ATs artificial tears; PFAT's preservative free artificial tears; NSC nuclear sclerotic cataract; PSC  posterior subcapsular cataract; ERM epi-retinal membrane; PVD posterior vitreous detachment; RD retinal detachment; DM diabetes mellitus; DR diabetic retinopathy; NPDR non-proliferative diabetic retinopathy; PDR proliferative diabetic retinopathy; CSME clinically significant macular edema; DME diabetic macular edema; dbh dot blot hemorrhages; CWS cotton wool spot; POAG primary open angle glaucoma; C/D cup-to-disc ratio; HVF humphrey visual field; GVF goldmann visual field; OCT optical coherence tomography; IOP intraocular pressure; BRVO Branch retinal vein occlusion; CRVO central retinal vein occlusion; CRAO central retinal artery occlusion; BRAO branch retinal artery occlusion; RT retinal tear; SB scleral buckle; PPV pars plana vitrectomy; VH Vitreous hemorrhage; PRP panretinal laser photocoagulation; IVK intravitreal kenalog ; VMT vitreomacular traction; MH Macular hole;  NVD neovascularization of the disc; NVE neovascularization elsewhere; AREDS age related eye disease study; ARMD age related macular degeneration; POAG primary open angle glaucoma; EBMD epithelial/anterior basement membrane dystrophy; ACIOL anterior chamber intraocular lens; IOL intraocular lens; PCIOL posterior chamber intraocular lens; Phaco/IOL phacoemulsification with intraocular lens placement; PRK photorefractive keratectomy; LASIK laser assisted in situ keratomileusis; HTN hypertension; DM diabetes mellitus; COPD chronic obstructive pulmonary disease

## 2023-08-30 ENCOUNTER — Ambulatory Visit (INDEPENDENT_AMBULATORY_CARE_PROVIDER_SITE_OTHER): Admitting: Ophthalmology

## 2023-08-30 ENCOUNTER — Encounter (HOSPITAL_COMMUNITY): Payer: Self-pay | Admitting: Ophthalmology

## 2023-08-30 ENCOUNTER — Other Ambulatory Visit: Payer: Self-pay

## 2023-08-30 ENCOUNTER — Encounter (INDEPENDENT_AMBULATORY_CARE_PROVIDER_SITE_OTHER): Payer: Self-pay | Admitting: Ophthalmology

## 2023-08-30 DIAGNOSIS — H35373 Puckering of macula, bilateral: Secondary | ICD-10-CM | POA: Diagnosis not present

## 2023-08-30 DIAGNOSIS — H35033 Hypertensive retinopathy, bilateral: Secondary | ICD-10-CM

## 2023-08-30 DIAGNOSIS — I10 Essential (primary) hypertension: Secondary | ICD-10-CM | POA: Diagnosis not present

## 2023-08-30 DIAGNOSIS — H3321 Serous retinal detachment, right eye: Secondary | ICD-10-CM

## 2023-08-30 DIAGNOSIS — Z961 Presence of intraocular lens: Secondary | ICD-10-CM

## 2023-08-30 NOTE — Progress Notes (Signed)
 SDW CALL  Patient was given pre-op instructions over the phone. The opportunity was given for the patient to ask questions. No further questions asked. Patient verbalized understanding of instructions given.   PCP - Dr. Norleen Rower at [facility] Cardiologist - denies  PPM/ICD - denies   Chest x-ray - denies EKG - DOS Stress Test -  denies  ECHO - denies Cardiac Cath - denies  Sleep Study - denies  No DM  Last dose of GLP1 agonist-  n/a GLP1 instructions:  n/a  Blood Thinner Instructions:  n/a Aspirin Instructions: n/a  ERAS Protcol - NPO  COVID TEST- n/a   Anesthesia review: yes  Patient denies shortness of breath, fever, cough and chest pain over the phone call   All instructions explained to the patient, with a verbal understanding of the material. Patient agrees to go over the instructions while at home for a better understanding.

## 2023-08-30 NOTE — Progress Notes (Signed)
 [facility] - Clinic Note  08/30/2023   CHIEF COMPLAINT Patient presents for Post-op Follow-up  HISTORY OF PRESENT ILLNESS: Devin Landry is a 83 y.o. male who presents to the clinic today for:  HPI     Post-op Follow-up   In right eye.  Discomfort includes floaters.  Vision is stable.  I, the attending physician,  performed the HPI with the patient and updated documentation appropriately.        Comments   RD OD, s/p pneumatic cryopexy 08.11.25. Patient states has noticed a floater in the right eye      Last edited by Valdemar Rogue, MD on 08/30/2023  8:47 AM.     Pt states he saw a new floater in OD last night, now its gone.   Referring physician: Karolynn Drivers, OD [facility], [place]  HISTORICAL INFORMATION:  Selected notes from the MEDICAL RECORD NUMBER Referred by Dr. Drivers Karolynn, OD for retinoschisis vs RD OD LEE:  Ocular Hx- pseudophakia OU (Epes) PMH-   CURRENT MEDICATIONS: Current Outpatient Medications (Ophthalmic Drugs)  Medication Sig   neomycin-polymyxin-dexameth (MAXITROL ) 0.1 % OINT Place 1 Application into the right eye 4 (four) times daily.   prednisoLONE  acetate (PRED FORTE ) 1 % ophthalmic suspension Place 1 drop into the left eye 4 (four) times daily.   No current facility-administered medications for this visit. (Ophthalmic Drugs)   Current Outpatient Medications (Other)  Medication Sig   acetaminophen  (TYLENOL ) 650 MG CR tablet Take 650 mg by mouth 2 (two) times daily.   Calcium  Citrate-Vitamin D (CALCIUM  CITRATE + D PO) Take 1 tablet by mouth 2 (two) times daily. Calcium  citrate 500 mg Vitamin D3 400 IU   Cholecalciferol (EQL VITAMIN D3) 25 MCG (1000 UT) capsule Take 1,000 Units by mouth daily.   diphenhydramine-acetaminophen  (TYLENOL  PM) 25-500 MG TABS tablet Take 1 tablet by mouth at bedtime.   docusate sodium  (COLACE) 100 MG capsule Take 100 mg by mouth daily.   gabapentin  (NEURONTIN ) 100 MG capsule Take 300  mg by mouth at bedtime.   Glucosamine-Chondroit-Vit C-Mn (GLUCOSAMINE 1500 COMPLEX PO) Take 1,500 mg by mouth 2 (two) times daily.   methocarbamol  (ROBAXIN ) 500 MG tablet Take 1 tablet (500 mg total) by mouth every 8 (eight) hours as needed for muscle spasms.   Multiple Vitamin (MULTIVITAMIN) tablet Take 1 tablet by mouth daily.   naproxen sodium (ALEVE) 220 MG tablet Take 220 mg by mouth daily.   oxymetazoline (AFRIN) 0.05 % nasal spray Place 1 spray into both nostrils 2 (two) times daily as needed for congestion.   rosuvastatin  (CRESTOR ) 5 MG tablet Take 5 mg by mouth 3 (three) times a week.   Cyanocobalamin (B-12 PO) Take 1 tablet by mouth 4 (four) times a week.   Meth-Hyo-M Bl-Na Phos-Ph Sal (URIBEL ) 118 MG CAPS Take 1 capsule (118 mg total) by mouth 3 (three) times daily as needed (Urinary frequency, urgency, burning). (Patient not taking: Reported on 09/01/2023)   No current facility-administered medications for this visit. (Other)   REVIEW OF SYSTEMS: ROS   Positive for: Musculoskeletal, Eyes Negative for: Constitutional, Gastrointestinal, Neurological, Skin, Genitourinary, HENT, Endocrine, Cardiovascular, Respiratory, Psychiatric, Allergic/Imm, Heme/Lymph Last edited by German Olam BRAVO, COT on 08/30/2023  7:41 AM.      ALLERGIES Allergies  Allergen Reactions   Food Color Red Anaphylaxis    Red delicious apple skins   Other     Red Delicious Apple skins causes anaphylaxis     Soy Allergy (  Obsolete) Other (See Comments)    Headache    Zocor [Simvastatin] Other (See Comments)    Leg pain   PAST MEDICAL HISTORY Past Medical History:  Diagnosis Date   Allergic state    Arthritis    BPH (benign prostatic hyperplasia)    Bursitis    hips   Cancer (HCC)    basal and squamous cell   Cataracts, bilateral    ED (erectile dysfunction)    Elevated PSA    Heart murmur    born with it but states he grew out of it   Hemorrhoids    History of chicken pox    History of colon  polyps    History of kidney stones    Hypercholesteremia    Hypogonadism in male    Nephrolithiasis    PIN III (prostatic intraepithelial neoplasia III)    Spinal stenosis of lumbar region    Umbilical hernia    Past Surgical History:  Procedure Laterality Date   ANKLE FRACTURE SURGERY Left 10/10/2007   CARPAL TUNNEL RELEASE Right 2007   CATARACT EXTRACTION, BILATERAL Bilateral    COLONOSCOPY     1993, 1996, 2000, 2005,2010, 2015, 2018   COLONOSCOPY WITH PROPOFOL  N/A 09/21/2016   Procedure: COLONOSCOPY WITH PROPOFOL ;  Surgeon: Viktoria Lamar DASEN, MD;  Location: [facility] ENDOSCOPY;  Service: Endoscopy;  Laterality: N/A;   CYSTOSCOPY W/ RETROGRADES  04/13/2021   Procedure: CYSTOSCOPY WITH RETROGRADE PYELOGRAM;  Surgeon: Twylla Glendia BROCKS, MD;  Location: [facility] ORS;  Service: Urology;;   CYSTOSCOPY WITH INSERTION OF UROLIFT N/A 07/27/2021   Procedure: CYSTOSCOPY WITH INSERTION OF UROLIFT;  Surgeon: Twylla Glendia BROCKS, MD;  Location: [facility] ORS;  Service: Urology;  Laterality: N/A;   CYSTOSCOPY/URETEROSCOPY/HOLMIUM LASER/STENT PLACEMENT Right 04/13/2021   Procedure: CYSTOSCOPY/URETEROSCOPY/HOLMIUM LASER/STENT PLACEMENT;  Surgeon: Twylla Glendia BROCKS, MD;  Location: [facility] ORS;  Service: Urology;  Laterality: Right;   FEMUR FRACTURE SURGERY Left 10/10/2007   FRACTURE SURGERY Left 10/10/2007   ORIF DISTAL RADIUS FRACTURE AND PERTROCHANTERIC FEMUR FRACTURE   HERNIA REPAIR Bilateral 1964   KNEE SURGERY Left 1984   torn meniscus   KNEE SURGERY Right 2005   torn meniscus   LUMBAR LAMINECTOMY/DECOMPRESSION MICRODISCECTOMY N/A 07/02/2021   Procedure: L2-S1 DECOMPRESSION;  Surgeon: Clois Fret, MD;  Location: [facility] ORS;  Service: Neurosurgery;  Laterality: N/A;   VITRECTOMY 25 GAUGE WITH SCLERAL BUCKLE Right 08/31/2023   Procedure: VITRECTOMY, USING 25-GAUGE INSTRUMENTS, WITH ENDOLASER & GAS;  Surgeon: Valdemar Rogue, MD;  Location: [facility] OR;  Service: Ophthalmology;  Laterality: Right;   FAMILY  HISTORY History reviewed. No pertinent family history. SOCIAL HISTORY Social History   Tobacco Use   Smoking status: Former    Types: Pipe    Quit date: 05/15/1978    Years since quitting: 45.3   Smokeless tobacco: Never  Vaping Use   Vaping status: Never Used  Substance Use Topics   Alcohol use: No   Drug use: No       OPHTHALMIC EXAM:  Base Eye Exam     Visual Acuity (Snellen - Linear)       Right Left   Dist cc 20/200 20/25 -1   Dist ph cc 20/NI 20/NI    Correction: Glasses         Tonometry (Tonopen, 7:49 AM)       Right Left   Pressure 14          Neuro/Psych     Oriented x3: Yes   Mood/Affect: Normal  Dilation     Right eye: 1.0% Mydriacyl , 2.5% Phenylephrine  @ 7:49 AM           Slit Lamp and Fundus Exam     External Exam       Right Left   External Normal Normal         Slit Lamp Exam       Right Left   Lids/Lashes Mild upper lid edema, Dermatochalasis Dermatochalasis, mild MGD   Conjunctiva/Sclera sub conj heme-improved ST quad, white and quiet White and quiet   Cornea Well healed cataract wound, tear film debris, fine endo pigment, 1+ Guttata, nasal and temporal LRI Well healed temporal cataract wound, tear film debris, trace PEE, nasal and temporal LRI, 2-3+ Guttata   Anterior Chamber Deep, 0.5+fine cell/pigment Deep, 3+fine cell and pigment   Iris Round and dilated Round and reactive   Lens 3 piece PC IOL in good position w/ open PC 3 piece PC IOL in good position w/ open PC   Anterior Vitreous Vitreous syneresis, +pigment, single gas bubble 20-25%, scattered debris Vitreous syneresis         Fundus Exam       Right Left   Disc Mild pallor, sharp rim Pink and Sharp, Compact   C/D Ratio 0.4 0.3   Macula Fovea stably reattached, SRF inferior mac increased Flat, Blunted foveal reflex, RPE mottling, no heme or edema   Vessels Attenuated, mild tortuosity Attenuated, mild tortuosity   Periphery SRF inferior mac  increased. Superior retina reattached under gas bubble. Pre OP: Bullous superior detachment from [id number], hole at 1200, pigmented CR scarring at 11 o'clock Attached, scattered pigmented CR scarring at 1200 and temporally, No heme, No edema           Refraction     Wearing Rx       Sphere Cylinder Axis Add   Right -1.25 +1.50 179 +2.50   Left -0.50 +1.00 161 +2.50    Type: Progressive           IMAGING AND PROCEDURES  Imaging and Procedures for 08/30/2023  OCT, Retina - OU - Both Eyes       Right Eye Quality was poor. Central Foveal Thickness: 293. Progression has worsened. Findings include normal foveal contour, no IRF, epiretinal membrane, subretinal fluid (Stable improvement in central SRF-- fovea reattached. Residual SRF nasal and inferior to disc caught on widefield--increased and migrating posteriorly. ).   Left Eye Quality was good. Central Foveal Thickness: 329. Progression has been stable. Findings include normal foveal contour, no IRF, no SRF, epiretinal membrane (Mild ERM w/ blunting of foveal contour. ).   Notes *Images captured and stored on drive  Diagnosis / Impression:  OD: Stable improvement in central SRF-- fovea reattached. Residual SRF nasal and inferior to disc caught on widefield--increased and migrating posteriorly OS: Mild ERM w/ blunting of foveal contour.   Clinical management:  See below  Abbreviations: NFP - Normal foveal profile. CME - cystoid macular edema. PED - pigment epithelial detachment. IRF - intraretinal fluid. SRF - subretinal fluid. EZ - ellipsoid zone. ERM - epiretinal membrane. ORA - outer retinal atrophy. ORT - outer retinal tubulation. SRHM - subretinal hyper-reflective material. IRHM - intraretinal hyper-reflective material           ASSESSMENT/PLAN:   ICD-10-CM   1. Right retinal detachment  H33.21 OCT, Retina - OU - Both Eyes    2. Epiretinal membrane (ERM) of both eyes  H35.373     3. Essential  hypertension  I10      4. Hypertensive retinopathy of both eyes  H35.033     5. Pseudophakia of both eyes  Z96.1      Rhegmatogenous retinal detachment, OD - Bullous superior detachment w/ extension inferiorly through fovea, onset ~08.01.25 by history - s/p pneumatic cryopexy OD on 08.11.25 - OCT OD today shows stable improvement in central SRF-- fovea reattached. Residual SRF nasal and inferior to disc caught on widefield--increased and migrating posteriorly  - positioning guidelines reviewed, continue to hold face down 45 degrees while awake and face down while sleeping - cont Maxitrol  QID OD - cont pred QID OD - discussed findings, prognosis - recommend 25g PPV w/ endolaser and gas this Thursday, 08.21.25-scheduled and consents signed.  - RBA of procedure discussed, questions answered - informed consent obtained and signed - f/u Friday 8am 8.22.25 POV1 RD OD, DFE, no OCT  2. Epiretinal membrane, OU - The natural history, anatomy, potential for loss of vision, and treatment options including vitrectomy techniques and the complications of endophthalmitis, retinal detachment, vitreous hemorrhage, cataract progression and permanent vision loss discussed with the patient. - mild ERM OU - no indication for surgery at this time - monitor for now  3,4. Hypertensive retinopathy OU - discussed importance of tight BP control - monitor   5. Pseudophakia OU  - s/p CE/IOL  - IOL in good position, doing well  - monitor   Ophthalmic Meds Ordered this visit:  No orders of the defined types were placed in this encounter.    Return in about 2 days (around 09/01/2023) for 8am POV1 s/p PPV OD on 08.21.25, RD OD, DFE OD-no OCT.  There are no Patient Instructions on file for this visit.  Explained the diagnoses, plan, and follow up with the patient and they expressed understanding.  Patient expressed understanding of the importance of proper follow up care.   This document serves as a record of services  personally performed by [name], MD, PhD. It was created on their behalf by Avelina Pereyra, COA an ophthalmic technician. The creation of this record is the provider's dictation and/or activities during the visit.   Electronically signed by: Avelina GORMAN Pereyra, COT  09/05/23  3:11 AM   This document serves as a record of services personally performed by [name], MD, PhD. It was created on their behalf by Almetta Pesa, an ophthalmic technician. The creation of this record is the provider's dictation and/or activities during the visit.    Electronically signed by: Almetta Pesa, OA, 09/05/23  3:11 AM  [name], M.D., Ph.D. Diseases & Surgery of the Retina and Vitreous [facility] 08/30/2023   Abbreviations: M myopia (nearsighted); A astigmatism; H hyperopia (farsighted); P presbyopia; Mrx spectacle prescription;  CTL contact lenses; OD right eye; OS left eye; OU both eyes  XT exotropia; ET esotropia; PEK punctate epithelial keratitis; PEE punctate epithelial erosions; DES dry eye syndrome; MGD meibomian gland dysfunction; ATs artificial tears; PFAT's preservative free artificial tears; NSC nuclear sclerotic cataract; PSC posterior subcapsular cataract; ERM epi-retinal membrane; PVD posterior vitreous detachment; RD retinal detachment; DM diabetes mellitus; DR diabetic retinopathy; NPDR non-proliferative diabetic retinopathy; PDR proliferative diabetic retinopathy; CSME clinically significant macular edema; DME diabetic macular edema; dbh dot blot hemorrhages; CWS cotton wool spot; POAG primary open angle glaucoma; C/D cup-to-disc ratio; HVF humphrey visual field; GVF goldmann visual field; OCT optical coherence tomography; IOP intraocular pressure; BRVO Branch retinal vein occlusion; CRVO central retinal vein  occlusion; CRAO central retinal artery occlusion; BRAO branch retinal artery occlusion; RT retinal tear; SB scleral buckle; PPV pars plana  vitrectomy; VH Vitreous hemorrhage; PRP panretinal laser photocoagulation; IVK intravitreal kenalog ; VMT vitreomacular traction; MH Macular hole;  NVD neovascularization of the disc; NVE neovascularization elsewhere; AREDS age related eye disease study; ARMD age related macular degeneration; POAG primary open angle glaucoma; EBMD epithelial/anterior basement membrane dystrophy; ACIOL anterior chamber intraocular lens; IOL intraocular lens; PCIOL posterior chamber intraocular lens; Phaco/IOL phacoemulsification with intraocular lens placement; PRK photorefractive keratectomy; LASIK laser assisted in situ keratomileusis; HTN hypertension; DM diabetes mellitus; COPD chronic obstructive pulmonary disease

## 2023-08-30 NOTE — H&P (Signed)
 Devin Landry is an 83 y.o. male.    Chief Complaint: retinal detachment, RIGHT EYE  HPI: Pt with history of decreased vision OD due to retinal detachment. Underwent pneumatic cryopexy OD on 08.11.25. The procedure has failed to reattach the retina fully, so we have recommended surgical repair of the retinal detachment OD. After a discussion of the risks, benefits and alternatives to retinal detachment surgery, the patient has elected to proceed with surgery to repair the detachment: 25g PPV w/ endolaser and gas OD under general anesthesia.   Past Medical History:  Diagnosis Date   Allergic state    Arthritis    BPH (benign prostatic hyperplasia)    Bursitis    hips   Cancer (HCC)    basal and squamous cell   Cataracts, bilateral    ED (erectile dysfunction)    Elevated PSA    Heart murmur    born with it but states he grew out of it   Hemorrhoids    History of chicken pox    History of colon polyps    History of kidney stones    Hypercholesteremia    Hypogonadism in male    Nephrolithiasis    PIN III (prostatic intraepithelial neoplasia III)    Spinal stenosis of lumbar region    Umbilical hernia     Past Surgical History:  Procedure Laterality Date   ANKLE FRACTURE SURGERY Left 10/10/2007   CARPAL TUNNEL RELEASE Right 2007   CATARACT EXTRACTION, BILATERAL Bilateral    COLONOSCOPY     1993, 1996, 2000, 2005,2010, 2015, 2018   COLONOSCOPY WITH PROPOFOL  N/A 09/21/2016   Procedure: COLONOSCOPY WITH PROPOFOL ;  Surgeon: Viktoria Lamar DASEN, MD;  Location: [facility] ENDOSCOPY;  Service: Endoscopy;  Laterality: N/A;   CYSTOSCOPY W/ RETROGRADES  04/13/2021   Procedure: CYSTOSCOPY WITH RETROGRADE PYELOGRAM;  Surgeon: Twylla Glendia BROCKS, MD;  Location: [facility] ORS;  Service: Urology;;   CYSTOSCOPY WITH INSERTION OF UROLIFT N/A 07/27/2021   Procedure: CYSTOSCOPY WITH INSERTION OF UROLIFT;  Surgeon: Twylla Glendia BROCKS, MD;  Location: [facility] ORS;  Service: Urology;  Laterality: N/A;    CYSTOSCOPY/URETEROSCOPY/HOLMIUM LASER/STENT PLACEMENT Right 04/13/2021   Procedure: CYSTOSCOPY/URETEROSCOPY/HOLMIUM LASER/STENT PLACEMENT;  Surgeon: Twylla Glendia BROCKS, MD;  Location: [facility] ORS;  Service: Urology;  Laterality: Right;   FEMUR FRACTURE SURGERY Left 10/10/2007   FRACTURE SURGERY Left 10/10/2007   ORIF DISTAL RADIUS FRACTURE AND PERTROCHANTERIC FEMUR FRACTURE   HERNIA REPAIR Bilateral 1964   KNEE SURGERY Left 1984   torn meniscus   KNEE SURGERY Right 2005   torn meniscus   LUMBAR LAMINECTOMY/DECOMPRESSION MICRODISCECTOMY N/A 07/02/2021   Procedure: L2-S1 DECOMPRESSION;  Surgeon: Clois Fret, MD;  Location: [facility] ORS;  Service: Neurosurgery;  Laterality: N/A;    History reviewed. No pertinent family history. Social History:  reports that he quit smoking about 45 years ago. His smoking use included pipe. He has never used smokeless tobacco. He reports that he does not drink alcohol and does not use drugs.  Allergies:  Allergies  Allergen Reactions   Food Color Red Anaphylaxis    Red delicious apple skins   Other     Red Delicious Apple skins causes anaphylaxis     Soy Allergy (Obsolete) Other (See Comments)    Headache    Zocor [Simvastatin] Other (See Comments)    Leg pain    No medications prior to admission.    Review of systems otherwise negative  There were no vitals taken for this visit.  Physical exam: Mental  status: oriented x3. Eyes: See eye exam associated with this date of surgery Ears, Nose, Throat: within normal limits Neck: Within Normal limits General: within normal limits Chest: Within normal limits Breast: deferred Heart: Within normal limits Abdomen: Within normal limits GU: deferred Extremities: within normal limits Skin: within normal limits  Assessment/Plan 1. Retinal detachment, RIGHT EYE  Plan: To [facility] for 25g PPV w/ endolaser and gas, OD, under general anesthesia - case scheduled for Thursday, Aug 31, 2023 at 0200  pm -- [facility] OR 08  [name], M.D., Ph.D. Vitreoretinal Surgeon [facility]

## 2023-08-31 ENCOUNTER — Encounter (HOSPITAL_COMMUNITY): Payer: Self-pay | Admitting: Ophthalmology

## 2023-08-31 ENCOUNTER — Ambulatory Visit (HOSPITAL_COMMUNITY)
Admission: RE | Admit: 2023-08-31 | Discharge: 2023-08-31 | Disposition: A | Discharge status: Home / Self Care | Attending: Ophthalmology | Admitting: Ophthalmology

## 2023-08-31 ENCOUNTER — Ambulatory Visit (HOSPITAL_BASED_OUTPATIENT_CLINIC_OR_DEPARTMENT_OTHER): Payer: Self-pay | Admitting: Physician Assistant

## 2023-08-31 ENCOUNTER — Ambulatory Visit (HOSPITAL_COMMUNITY): Payer: Self-pay | Admitting: Physician Assistant

## 2023-08-31 ENCOUNTER — Encounter (HOSPITAL_BASED_OUTPATIENT_CLINIC_OR_DEPARTMENT_OTHER)
Admission: RE | Disposition: A | Discharge status: Home / Self Care | Payer: Self-pay | Source: Home / Self Care | Attending: Ophthalmology

## 2023-08-31 DIAGNOSIS — Z87891 Personal history of nicotine dependence: Secondary | ICD-10-CM | POA: Diagnosis not present

## 2023-08-31 DIAGNOSIS — H3321 Serous retinal detachment, right eye: Secondary | ICD-10-CM | POA: Insufficient documentation

## 2023-08-31 DIAGNOSIS — J45909 Unspecified asthma, uncomplicated: Secondary | ICD-10-CM | POA: Diagnosis not present

## 2023-08-31 HISTORY — PX: VITRECTOMY 25 GAUGE WITH SCLERAL BUCKLE: SHX6183

## 2023-08-31 LAB — BASIC METABOLIC PANEL WITH GFR
Anion gap: 13 (ref 5–15)
BUN: 24 mg/dL — ABNORMAL HIGH (ref 8–23)
CO2: 22 mmol/L (ref 22–32)
Calcium: 9.1 mg/dL (ref 8.9–10.3)
Chloride: 105 mmol/L (ref 98–111)
Creatinine, Ser: 0.95 mg/dL (ref 0.61–1.24)
GFR, Estimated: >60 mL/min (ref >60)
Glucose, Bld: 112 mg/dL — ABNORMAL HIGH (ref 70–99)
Potassium: 4.1 mmol/L (ref 3.5–5.1)
Sodium: 140 mmol/L (ref 135–145)

## 2023-08-31 LAB — CBC
HCT: 44.9 % (ref 39.0–52.0)
Hemoglobin: 14.9 g/dL (ref 13.0–17.0)
MCH: 33.1 pg (ref 26.0–34.0)
MCHC: 33.2 g/dL (ref 30.0–36.0)
MCV: 99.8 fL (ref 80.0–100.0)
Platelets: 251 K/uL (ref 150–400)
RBC: 4.5 MIL/uL (ref 4.22–5.81)
RDW: 12 % (ref 11.5–15.5)
WBC: 5.5 K/uL (ref 4.0–10.5)
nRBC: 0 % (ref 0.0–0.2)

## 2023-08-31 SURGERY — VITRECTOMY, USING 25-GAUGE INSTRUMENTS, WITH SCLERAL BUCKLING
Anesthesia: General | Site: Eye | Laterality: Right

## 2023-08-31 MED ORDER — EPINEPHRINE PF 1 MG/ML IJ SOLN
INTRAOCULAR | Status: DC | PRN
  Administered 2023-08-31: 500 mL

## 2023-08-31 MED ORDER — BRIMONIDINE TARTRATE 0.2 % OP SOLN
OPHTHALMIC | Status: AC
  Filled 2023-08-31: qty 5

## 2023-08-31 MED ORDER — FENTANYL CITRATE (PF) 100 MCG/2ML IJ SOLN: 25.0000 ug | INTRAMUSCULAR | Status: DC | PRN

## 2023-08-31 MED ORDER — ONDANSETRON HCL 4 MG/2ML IJ SOLN: 4.0000 mg | Freq: Once | INTRAMUSCULAR | Status: DC | PRN

## 2023-08-31 MED ORDER — ATROPINE SULFATE 1 % OP SOLN
OPHTHALMIC | Status: DC | PRN
  Administered 2023-08-31: 1 [drp] via OPHTHALMIC

## 2023-08-31 MED ORDER — BSS PLUS IO SOLN
INTRAOCULAR | Status: AC
  Filled 2023-08-31: qty 500

## 2023-08-31 MED ORDER — FENTANYL CITRATE (PF) 250 MCG/5ML IJ SOLN
INTRAMUSCULAR | Status: AC
  Filled 2023-08-31: qty 5

## 2023-08-31 MED ORDER — STERILE WATER FOR INJECTION IJ SOLN
INTRAMUSCULAR | Status: AC
  Filled 2023-08-31: qty 10

## 2023-08-31 MED ORDER — ACETAZOLAMIDE SODIUM 500 MG IJ SOLR
INTRAMUSCULAR | Status: AC
  Filled 2023-08-31: qty 500

## 2023-08-31 MED ORDER — EPHEDRINE SULFATE-NACL 50-0.9 MG/10ML-% IV SOSY
PREFILLED_SYRINGE | INTRAVENOUS | Status: DC | PRN
  Administered 2023-08-31: 10 mg via INTRAVENOUS

## 2023-08-31 MED ORDER — TRIAMCINOLONE ACETONIDE 40 MG/ML IJ SUSP
INTRAMUSCULAR | Status: AC
  Filled 2023-08-31: qty 5

## 2023-08-31 MED ORDER — BRIMONIDINE TARTRATE 0.2 % OP SOLN
OPHTHALMIC | Status: DC | PRN
  Administered 2023-08-31: 1 [drp] via OPHTHALMIC

## 2023-08-31 MED ORDER — PREDNISOLONE ACETATE 1 % OP SUSP
OPHTHALMIC | Status: AC
  Filled 2023-08-31: qty 5

## 2023-08-31 MED ORDER — GATIFLOXACIN 0.5 % OP SOLN
OPHTHALMIC | Status: AC
  Filled 2023-08-31: qty 2.5

## 2023-08-31 MED ORDER — POLYMYXIN B SULFATE 500000 UNITS IJ SOLR
INTRAMUSCULAR | Status: AC
  Filled 2023-08-31: qty 10

## 2023-08-31 MED ORDER — FENTANYL CITRATE (PF) 250 MCG/5ML IJ SOLN
INTRAMUSCULAR | Status: DC | PRN
  Administered 2023-08-31 (×5): 50 ug via INTRAVENOUS

## 2023-08-31 MED ORDER — SODIUM CHLORIDE (PF) 0.9 % IJ SOLN
INTRAMUSCULAR | Status: AC
  Filled 2023-08-31: qty 10

## 2023-08-31 MED ORDER — PREDNISOLONE ACETATE 1 % OP SUSP
OPHTHALMIC | Status: DC | PRN
  Administered 2023-08-31: 1 [drp] via OPHTHALMIC

## 2023-08-31 MED ORDER — SUGAMMADEX SODIUM 200 MG/2ML IV SOLN
INTRAVENOUS | Status: DC | PRN
  Administered 2023-08-31: 100 mg via INTRAVENOUS
  Administered 2023-08-31: 200 mg via INTRAVENOUS

## 2023-08-31 MED ORDER — ACETAMINOPHEN 10 MG/ML IV SOLN
1000.0000 mg | Freq: Once | INTRAVENOUS | Status: AC
  Administered 2023-08-31: 1000 mg via INTRAVENOUS
  Filled 2023-08-31: qty 100

## 2023-08-31 MED ORDER — NA CHONDROIT SULF-NA HYALURON 40-30 MG/ML IO SOSY
INTRAOCULAR | Status: AC
  Filled 2023-08-31: qty 1

## 2023-08-31 MED ORDER — LACTATED RINGERS IV SOLN
INTRAVENOUS | Status: DC | PRN
Start: 2023-08-31 — End: 2023-08-31

## 2023-08-31 MED ORDER — ATROPINE SULFATE 1 % OP SOLN
OPHTHALMIC | Status: AC
  Filled 2023-08-31: qty 5

## 2023-08-31 MED ORDER — CHLORHEXIDINE GLUCONATE 0.12 % MT SOLN
15.0000 mL | Freq: Once | OROMUCOSAL | Status: AC
  Administered 2023-08-31: 15 mL via OROMUCOSAL
  Filled 2023-08-31: qty 15

## 2023-08-31 MED ORDER — PROPOFOL 10 MG/ML IV BOLUS
INTRAVENOUS | Status: DC | PRN
  Administered 2023-08-31: 70 mg via INTRAVENOUS
  Administered 2023-08-31: 50 mg via INTRAVENOUS
  Administered 2023-08-31: 30 mg via INTRAVENOUS

## 2023-08-31 MED ORDER — ATROPINE SULFATE 1 % OP SOLN
1.0000 [drp] | OPHTHALMIC | Status: AC | PRN
  Administered 2023-08-31 (×3): 1 [drp] via OPHTHALMIC
  Filled 2023-08-31: qty 2

## 2023-08-31 MED ORDER — ROCURONIUM BROMIDE 10 MG/ML (PF) SYRINGE
PREFILLED_SYRINGE | INTRAVENOUS | Status: DC | PRN
  Administered 2023-08-31: 50 mg via INTRAVENOUS
  Administered 2023-08-31 (×2): 20 mg via INTRAVENOUS
  Administered 2023-08-31: 30 mg via INTRAVENOUS

## 2023-08-31 MED ORDER — BSS IO SOLN
INTRAOCULAR | Status: AC
  Filled 2023-08-31: qty 15

## 2023-08-31 MED ORDER — PHENYLEPHRINE HCL-NACL 20-0.9 MG/250ML-% IV SOLN
INTRAVENOUS | Status: DC | PRN
  Administered 2023-08-31: 40 ug/min via INTRAVENOUS

## 2023-08-31 MED ORDER — TRIAMCINOLONE ACETONIDE 40 MG/ML IJ SUSP
INTRAMUSCULAR | Status: DC | PRN
  Administered 2023-08-31: 40 mg

## 2023-08-31 MED ORDER — ONDANSETRON HCL 4 MG/2ML IJ SOLN
INTRAMUSCULAR | Status: DC | PRN
  Administered 2023-08-31: 4 mg via INTRAVENOUS

## 2023-08-31 MED ORDER — PROPARACAINE HCL 0.5 % OP SOLN
1.0000 [drp] | OPHTHALMIC | Status: AC | PRN
  Administered 2023-08-31 (×3): 1 [drp] via OPHTHALMIC
  Filled 2023-08-31: qty 15

## 2023-08-31 MED ORDER — PROPOFOL 10 MG/ML IV BOLUS
INTRAVENOUS | Status: AC
  Filled 2023-08-31: qty 20

## 2023-08-31 MED ORDER — LIDOCAINE 2% (20 MG/ML) 5 ML SYRINGE
INTRAMUSCULAR | Status: DC | PRN
  Administered 2023-08-31: 60 mg via INTRAVENOUS

## 2023-08-31 MED ORDER — DEXAMETHASONE SODIUM PHOSPHATE 10 MG/ML IJ SOLN
INTRAMUSCULAR | Status: DC | PRN
  Administered 2023-08-31: 10 mg via INTRAVENOUS

## 2023-08-31 MED ORDER — TROPICAMIDE 1 % OP SOLN
1.0000 [drp] | OPHTHALMIC | Status: AC | PRN
  Administered 2023-08-31 (×3): 1 [drp] via OPHTHALMIC
  Filled 2023-08-31: qty 15

## 2023-08-31 MED ORDER — BACITRACIN-POLYMYXIN B 500-10000 UNIT/GM OP OINT
TOPICAL_OINTMENT | OPHTHALMIC | Status: AC
  Filled 2023-08-31: qty 3.5

## 2023-08-31 MED ORDER — GATIFLOXACIN 0.5 % OP SOLN
OPHTHALMIC | Status: DC | PRN
  Administered 2023-08-31: 1 [drp] via OPHTHALMIC

## 2023-08-31 MED ORDER — DORZOLAMIDE HCL-TIMOLOL MAL 2-0.5 % OP SOLN
OPHTHALMIC | Status: AC
  Filled 2023-08-31: qty 10

## 2023-08-31 MED ORDER — ORAL CARE MOUTH RINSE: 15.0000 mL | Freq: Once | OROMUCOSAL | Status: AC

## 2023-08-31 MED ORDER — DORZOLAMIDE HCL-TIMOLOL MAL 2-0.5 % OP SOLN
OPHTHALMIC | Status: DC | PRN
  Administered 2023-08-31: 1 [drp] via OPHTHALMIC

## 2023-08-31 MED ORDER — PHENYLEPHRINE HCL 10 % OP SOLN
1.0000 [drp] | OPHTHALMIC | Status: AC | PRN
  Administered 2023-08-31 (×3): 1 [drp] via OPHTHALMIC
  Filled 2023-08-31: qty 5

## 2023-08-31 MED ORDER — SODIUM CHLORIDE 0.9 % IV SOLN: INTRAVENOUS | Status: DC | PRN

## 2023-08-31 MED ORDER — NA CHONDROIT SULF-NA HYALURON 40-30 MG/ML IO SOSY
INTRAOCULAR | Status: DC | PRN
  Administered 2023-08-31: .5 mL via INTRAOCULAR

## 2023-08-31 MED ORDER — BSS IO SOLN
INTRAOCULAR | Status: DC | PRN
  Administered 2023-08-31: 15 mL via INTRAOCULAR

## 2023-08-31 MED ORDER — CEFTAZIDIME 1 G IJ SOLR
INTRAMUSCULAR | Status: AC
  Filled 2023-08-31: qty 1

## 2023-08-31 MED ORDER — LACTATED RINGERS IV SOLN: INTRAVENOUS | Status: DC

## 2023-08-31 MED ORDER — BUPIVACAINE HCL (PF) 0.75 % IJ SOLN
INTRAMUSCULAR | Status: AC
  Filled 2023-08-31: qty 10

## 2023-08-31 MED ORDER — STERILE WATER FOR INJECTION IJ SOLN
INTRAMUSCULAR | Status: DC | PRN
  Administered 2023-08-31: 20 mL

## 2023-08-31 MED ORDER — EPINEPHRINE PF 1 MG/ML IJ SOLN
INTRAMUSCULAR | Status: AC
  Filled 2023-08-31: qty 1

## 2023-08-31 MED ORDER — BACITRACIN-POLYMYXIN B 500-10000 UNIT/GM OP OINT
TOPICAL_OINTMENT | OPHTHALMIC | Status: DC | PRN
Start: 2023-08-31 — End: 2023-08-31
  Administered 2023-08-31: 1 via OPHTHALMIC

## 2023-08-31 SURGICAL SUPPLY — 39 items
APPLICATOR COTTON TIP 6 STRL (MISCELLANEOUS) ×4 IMPLANT
BAND WRIST GAS GREEN (MISCELLANEOUS) IMPLANT
BETADINE 5% OPHTHALMIC (OPHTHALMIC) ×2 IMPLANT
BLADE MVR KNIFE 20G (BLADE) IMPLANT
BNDG EYE OVAL 2 1/8 X 2 5/8 (GAUZE/BANDAGES/DRESSINGS) IMPLANT
CABLE BIPOLOR RESECTION CORD (MISCELLANEOUS) IMPLANT
CANNULA DUALBORE 25G (CANNULA) ×1 IMPLANT
CANNULA FLEX TIP 25G (CANNULA) ×1 IMPLANT
CLSR STERI-STRIP ANTIMIC 1/2X4 (GAUZE/BANDAGES/DRESSINGS) ×1 IMPLANT
DRAPE INCISE 51X51 W/FILM STRL (DRAPES) ×1 IMPLANT
DRAPE MICROSCOPE LEICA 46X105 (MISCELLANEOUS) ×1 IMPLANT
DRAPE OPHTHALMIC 77X100 STRL (CUSTOM PROCEDURE TRAY) ×1 IMPLANT
FILTER STRAW FLUID ASPIR (MISCELLANEOUS) IMPLANT
FORCEPS GRIESHABER ILM 25G A (INSTRUMENTS) IMPLANT
GAS AUTO FILL CONSTELLATION (OPHTHALMIC) IMPLANT
GLOVE BIOGEL M 7.0 STRL (GLOVE) ×1 IMPLANT
GOWN STRL REUS W/ TWL LRG LVL3 (GOWN DISPOSABLE) ×3 IMPLANT
KIT BASIN OR (CUSTOM PROCEDURE TRAY) ×1 IMPLANT
KIT PERFLUORON PROCEDURE 5ML (MISCELLANEOUS) IMPLANT
LENS VITRECTOMY FLAT OCLR DISP (MISCELLANEOUS) IMPLANT
NDL 18GX1X1/2 (RX/OR ONLY) (NEEDLE) ×2 IMPLANT
NDL 25GX 5/8IN NON SAFETY (NEEDLE) ×1 IMPLANT
NDL FILTER BLUNT 18X1 1/2 (NEEDLE) ×1 IMPLANT
NEEDLE 18GX1X1/2 (RX/OR ONLY) (NEEDLE) ×2 IMPLANT
NEEDLE 25GX 5/8IN NON SAFETY (NEEDLE) ×1 IMPLANT
NEEDLE FILTER BLUNT 18X1 1/2 (NEEDLE) ×1 IMPLANT
NS IRRIG 1000ML POUR BTL (IV SOLUTION) ×1 IMPLANT
PACK VITRECTOMY CUSTOM (CUSTOM PROCEDURE TRAY) ×1 IMPLANT
PAD ARMBOARD POSITIONER FOAM (MISCELLANEOUS) ×2 IMPLANT
PAK PIK VITRECTOMY CVS 25GA (OPHTHALMIC) ×1 IMPLANT
PIK ILLUMINATED 25G (OPHTHALMIC) IMPLANT
PROBE ENDO DIATHERMY 25G (MISCELLANEOUS) IMPLANT
PROBE LASER ILLUM FLEX CVD 25G (OPHTHALMIC) IMPLANT
SHIELD EYE LENSE ONLY DISP (GAUZE/BANDAGES/DRESSINGS) IMPLANT
SUT VICRYL 7 0 TG140 8 (SUTURE) ×1 IMPLANT
SYR 10ML LL (SYRINGE) ×2 IMPLANT
SYR TB 1ML LUER SLIP (SYRINGE) ×1 IMPLANT
TOWEL GREEN STERILE FF (TOWEL DISPOSABLE) ×1 IMPLANT
WATER STERILE IRR 1000ML POUR (IV SOLUTION) ×1 IMPLANT

## 2023-08-31 NOTE — Brief Op Note (Signed)
 08/31/2023  6:15 PM  PATIENT:  Ubaldo KANDICE Satterfield  83 y.o. male  PRE-OPERATIVE DIAGNOSIS:  Right retinal detachment  POST-OPERATIVE DIAGNOSIS:  Right retinal detachment  PROCEDURE:  Procedure(s): VITRECTOMY, USING 25-GAUGE INSTRUMENTS, WITH ENDOLASER & GAS (Right)  SURGEON:  Surgeons and Role:    DEWAINE Valdemar Rogue, MD - Primary  ASSISTANTS: Almetta Pesa, Ophthalmic Assistant    ANESTHESIA:   general  EBL:  25 mL   BLOOD ADMINISTERED:none  DRAINS: none   LOCAL MEDICATIONS USED:  NONE  SPECIMEN:  No Specimen  DISPOSITION OF SPECIMEN:  N/A  COUNTS:  YES  TOURNIQUET:  * No tourniquets in log *  DICTATION: .Note written in EPIC  PLAN OF CARE: Discharge to home after PACU  PATIENT DISPOSITION:  PACU - hemodynamically stable.   Delay start of Pharmacological VTE agent (>24hrs) due to surgical blood loss or risk of bleeding: not applicable

## 2023-08-31 NOTE — Transfer of Care (Signed)
 Immediate Anesthesia Transfer of Care Note  Patient: Devin Landry  Procedure(s) Performed: VITRECTOMY, USING 25-GAUGE INSTRUMENTS (Right: Eye)  Patient Location: PACU  Anesthesia Type:General  Level of Consciousness: awake and drowsy  Airway & Oxygen Therapy: Patient connected to face mask oxygen  Post-op Assessment: Report given to RN, Post -op Vital signs reviewed and stable, and Patient moving all extremities  Post vital signs: Reviewed and stable  Last Vitals:  Vitals Value Taken Time  BP 141/85 08/31/23 17:51  Temp    Pulse 93 08/31/23 17:55  Resp 12 08/31/23 17:55  SpO2 95 % 08/31/23 17:55  Vitals shown include unfiled device data.  Last Pain:  Vitals:   08/31/23 1750  TempSrc:   PainSc: Asleep      Patients Stated Pain Goal: 2 (08/31/23 1307)  Complications: No notable events documented.

## 2023-08-31 NOTE — Anesthesia Procedure Notes (Signed)
 Procedure Name: Intubation Date/Time: 08/31/2023 2:51 PM  Performed by: Jerl Donald LABOR, CRNAPre-anesthesia Checklist: Patient identified, Emergency Drugs available, Suction available and Patient being monitored Patient Re-evaluated:Patient Re-evaluated prior to induction Oxygen Delivery Method: Circle System Utilized Preoxygenation: Pre-oxygenation with 100% oxygen Induction Type: IV induction Ventilation: Mask ventilation without difficulty Laryngoscope Size: Mac and 4 Grade View: Grade I Tube type: Oral Number of attempts: 1 Airway Equipment and Method: Stylet and Oral airway Placement Confirmation: ETT inserted through vocal cords under direct vision, positive ETCO2 and breath sounds checked- equal and bilateral Secured at: 24 cm Tube secured with: Tape Dental Injury: Teeth and Oropharynx as per pre-operative assessment

## 2023-08-31 NOTE — Interval H&P Note (Signed)
 History and Physical Interval Note:  08/31/2023 2:04 PM  Devin Landry  has presented today for surgery, with the diagnosis of H33.21-Right retinal detachment.  The various methods of treatment have been discussed with the patient and family. After consideration of risks, benefits and other options for treatment, the patient has consented to  Procedure(s): VITRECTOMY, USING 25-GAUGE INSTRUMENTS, WITH SCLERAL BUCKLING (Right) as a surgical intervention.  The patient's history has been reviewed, patient examined, no change in status, stable for surgery.  I have reviewed the patient's chart and labs.  Questions were answered to the patient's satisfaction.     Redell Hans

## 2023-08-31 NOTE — Anesthesia Preprocedure Evaluation (Addendum)
 Anesthesia Evaluation  Patient identified by MRN, date of birth, ID band Patient awake    Reviewed: Allergy & Precautions, NPO status , Patient's Chart, lab work & pertinent test results  Airway Mallampati: II  TM Distance: >3 FB Neck ROM: Full    Dental  (+) Teeth Intact, Dental Advisory Given   Pulmonary asthma (childhood) , former smoker   Pulmonary exam normal breath sounds clear to auscultation       Cardiovascular negative cardio ROS Normal cardiovascular exam Rhythm:Regular Rate:Normal     Neuro/Psych negative neurological ROS  negative psych ROS   GI/Hepatic negative GI ROS, Neg liver ROS,,,  Endo/Other  negative endocrine ROS    Renal/GU negative Renal ROS  negative genitourinary   Musculoskeletal  (+) Arthritis , Osteoarthritis,    Abdominal   Peds  Hematology negative hematology ROS (+)   Anesthesia Other Findings   Reproductive/Obstetrics negative OB ROS                              Anesthesia Physical Anesthesia Plan  ASA: 2  Anesthesia Plan: General   Post-op Pain Management: Tylenol  PO (pre-op)*   Induction: Intravenous  PONV Risk Score and Plan: 2 and Ondansetron , Dexamethasone  and Treatment may vary due to age or medical condition  Airway Management Planned: Oral ETT  Additional Equipment: None  Intra-op Plan:   Post-operative Plan: Extubation in OR  Informed Consent: I have reviewed the patients History and Physical, chart, labs and discussed the procedure including the risks, benefits and alternatives for the proposed anesthesia with the patient or authorized representative who has indicated his/her understanding and acceptance.     Dental advisory given  Plan Discussed with: CRNA  Anesthesia Plan Comments:          Anesthesia Quick Evaluation

## 2023-08-31 NOTE — Discharge Instructions (Signed)
 POSTOPERATIVE INSTRUCTIONS  Your doctor has performed vitreoretinal surgery on you at [facility].  - Keep eye patched and shielded until seen by Dr. Valdemar 8 AM tomorrow in clinic - Do not use drops until return - FACE DOWN POSITIONING WHILE AWAKE - Sleep with belly down  - No strenuous bending, stooping or lifting.  - You may not drive until further notice.  - If your doctor used a gas bubble in your eye during the procedure he will advise you on postoperative positioning. If you have a gas bubble you will be wearing a green bracelet that was applied in the operating room. The green bracelet should stay on as long as the gas bubble is in your eye. While the gas bubble is present you should not fly in an airplane. If you require general anesthesia while the gas bubble is present you must notify your anesthesiologist that an intraocular gas bubble is present so he can take the appropriate precautions.  - Tylenol  or any other over-the-counter pain reliever can be used according to your doctor. If more pain medicine is required, your doctor will have a prescription for you.  - You may read, go up and down stairs, and watch television.     Redell Valdemar, M.D., Ph.D.

## 2023-08-31 NOTE — Anesthesia Postprocedure Evaluation (Signed)
 Anesthesia Post Note  Patient: Devin Landry  Procedure(s) Performed: VITRECTOMY, USING 25-GAUGE INSTRUMENTS, WITH ENDOLASER & GAS (Right: Eye)     Patient location during evaluation: PACU Anesthesia Type: General Level of consciousness: awake and alert, oriented and patient cooperative Pain management: pain level controlled Vital Signs Assessment: post-procedure vital signs reviewed and stable Respiratory status: spontaneous breathing, nonlabored ventilation and respiratory function stable Cardiovascular status: blood pressure returned to baseline and stable Postop Assessment: no apparent nausea or vomiting and able to ambulate Anesthetic complications: no   No notable events documented.  Last Vitals:  Vitals:   08/31/23 1815 08/31/23 1830  BP: (!) 140/69 122/77  Pulse: 92 88  Resp: 13 19  Temp:  36.7 C  SpO2: 93% 94%    Last Pain:  Vitals:   08/31/23 1830  TempSrc:   PainSc: 0-No pain                 Sierra Bissonette,E. Chazz Philson

## 2023-08-31 NOTE — Op Note (Signed)
 Date of procedure: 08.21.25   Surgeon: Redell Hans, MD, PhD   Assistant: Almetta Pesa, Ophthalmic Assistant    Pre-operative Diagnosis: Retinal Detachment, Right Eye   Post-operative diagnosis: Retinal Detachment, Right Eye   Anesthesia: GETA   Procedures: 1)     25 gauge pars plana vitrectomy, Right Eye CPT [phone] 2)     Perfluorocarbon injection 3)     Fluid-air exchange, Right Eye 4)     Endolaser, Right Eye 5)     Injection of 14% C3F8 gas   Complications: none Estimated blood loss: minimal Specimens: none   Brief history:   Pt with history of decreased vision OD due to retinal detachment. Underwent pneumatic cryopexy OD on 08.11.25. The procedure has failed to reattach the retina fully, so we have recommended surgical repair of the retinal detachment OD. The risks, benefits, and alternatives were explained to the patient, including pain, bleeding, infection, loss of vision, double vision, droopy eyelids, and need for more surgeries.  Informed consent was obtained from the patient and placed in the chart.     Procedure:             The patient was brought to the preoperative holding area where the correct eye was confirmed and marked.  The patient was then brought to the operating room where general endotracheal anesthesia was induced. A secondary time-out was performed to identify the correct patient, eye, procedures, and any allergies. The right eye was prepped and draped in the usual sterile ophthalmic fashion followed by placement of a lid speculum.             A 25 gauge trocar was placed in the inferotemporal quadrant in a beveled fashion. A 4 mm infusion cannula was placed through this trocar, and the infusion cannula was confirmed in the vitreous cavity with no incarceration of retina or choroid prior to turning it on. Two additional 25 gauge trocars were placed in the superonasal and superotemporal quadrants (2 and 10 oclock, respectively) in a similar beveled fashion. At  this time, a standard three-port pars plana vitrectomy was performed using the light pipe, the cutter, and the BIOM viewing system. A thorough anterior, core and peripheral vitreous dissection was performed. Of note, there was some residual C3F8 gas and vitreous debris from the prior pneumatic cryopexy procedure that was removed without complication. A posterior vitreous detachment was confirmed over the optic nerve. There was a nasal and inferior retinal detachment extending from 1200 to 0700 oclock w/ some macular involvement. The fovea was reattached. The original offending retinal hole at 1200 oclock was flat with good cryo changes surrounding. On further inspection of the peripheral retina, we did not identify a definitive new retinal hole or tear. There was, however, significant vitreous traction on the equatorial retina, which was likely the etiology of the recurrent retinal detachment.             The tractional vitreous was carefully removed from the retina. Perfluoron was injected to push the subretinal fluid anteriorly. A peripheral retinotomy was created at 0330 using diathermy and the extrusion cannula and the displaced subretinal fluid was drained. A complete fluid-air exchange was performed with a soft tip extrusion cannula over the retinotomy, then posteriorly to remove the perfluoron. After completion of these maneuvers, there was a shallow collection of subretinal fluid anterior to the disc, so a posterior retinotomy was created just nasal to the disc, again, using diathermy and the extrusion cannula. The extrusion cannula was used to  drain this shallow subretinal fluid. At this point, the retina was flat and reattached. Under air, endolaser was applied to both retinotomies and then peripherally 360 degrees.             At this time, the superotemporal trocar was removed and sutured with 7-0 vicryl in an interrupted fashion.  A complete air to 14% C3F8 gas exchange was performed through the  infusion cannula and vented through the superonasal trocar using the extrusion cannula. The superonasal trocar and infusion cannula and associated trocar were then removed and sutured with 7-0 vicryl in an interrupted fashion. The eye's intraocular pressure was confirmed to be at a physiologic level by digital palpation. Subconjunctival injections of Antibiotic and kenalog  were administered. The lid speculum and drapes were removed. Drops of an antibiotic, antihypertensives, and steroid were given. Copious antibiotic ointment was instilled into the eye. The eye was patched and shielded. The patient tolerated the procedure well without any intraoperative or immediate postoperative complications. The patient was taken to the recovery room in good condition. The patient was instructed to maintain a strict face-down position and will be seen by Dr. Valdemar tomorrow morning in clinic.

## 2023-09-01 ENCOUNTER — Encounter (INDEPENDENT_AMBULATORY_CARE_PROVIDER_SITE_OTHER): Payer: Self-pay | Admitting: Ophthalmology

## 2023-09-01 ENCOUNTER — Ambulatory Visit (INDEPENDENT_AMBULATORY_CARE_PROVIDER_SITE_OTHER): Admitting: Ophthalmology

## 2023-09-01 DIAGNOSIS — H3321 Serous retinal detachment, right eye: Secondary | ICD-10-CM

## 2023-09-01 DIAGNOSIS — H35373 Puckering of macula, bilateral: Secondary | ICD-10-CM

## 2023-09-01 DIAGNOSIS — H35033 Hypertensive retinopathy, bilateral: Secondary | ICD-10-CM

## 2023-09-01 DIAGNOSIS — Z961 Presence of intraocular lens: Secondary | ICD-10-CM

## 2023-09-01 DIAGNOSIS — I10 Essential (primary) hypertension: Secondary | ICD-10-CM

## 2023-09-01 NOTE — Progress Notes (Signed)
 [facility] - Clinic Note  09/01/2023   CHIEF COMPLAINT Patient presents for Retina Follow Up  HISTORY OF PRESENT ILLNESS: Devin Landry is a 83 y.o. male who presents to the clinic today for:  HPI     Retina Follow Up   Patient presents with  Other.  In right eye.  This started 1 day ago.  I, the attending physician,  performed the HPI with the patient and updated documentation appropriately.        Comments   Patient here for 1 day follow up for POV. Patient states vision sees a little bit of light. Had a little bit of eye pain like being poked and sand in eye. Had a little purple reflected thing last night.       Last edited by Valdemar Rogue, MD on 09/05/2023 11:42 PM.    Patient states sees a little bit of light today. Some eye pain/FBS.   Referring physician: Karolynn Drivers, OD [facility] [place]  HISTORICAL INFORMATION:  Selected notes from the MEDICAL RECORD NUMBER Referred by Dr. Drivers Karolynn, OD for retinoschisis vs RD OD LEE:  Ocular Hx- pseudophakia OU (Epes) PMH-   CURRENT MEDICATIONS: Current Outpatient Medications (Ophthalmic Drugs)  Medication Sig   neomycin-polymyxin-dexameth (MAXITROL ) 0.1 % OINT Place 1 Application into the right eye 4 (four) times daily.   prednisoLONE  acetate (PRED FORTE ) 1 % ophthalmic suspension Place 1 drop into the left eye 4 (four) times daily.   No current facility-administered medications for this visit. (Ophthalmic Drugs)   Current Outpatient Medications (Other)  Medication Sig   acetaminophen  (TYLENOL ) 650 MG CR tablet Take 650 mg by mouth 2 (two) times daily.   Calcium  Citrate-Vitamin D (CALCIUM  CITRATE + D PO) Take 1 tablet by mouth 2 (two) times daily. Calcium  citrate 500 mg Vitamin D3 400 IU   Cholecalciferol (EQL VITAMIN D3) 25 MCG (1000 UT) capsule Take 1,000 Units by mouth daily.   Cyanocobalamin (B-12 PO) Take 1 tablet by mouth 4 (four) times a week.   diphenhydramine-acetaminophen   (TYLENOL  PM) 25-500 MG TABS tablet Take 1 tablet by mouth at bedtime.   docusate sodium  (COLACE) 100 MG capsule Take 100 mg by mouth daily.   gabapentin  (NEURONTIN ) 100 MG capsule Take 300 mg by mouth at bedtime.   Glucosamine-Chondroit-Vit C-Mn (GLUCOSAMINE 1500 COMPLEX PO) Take 1,500 mg by mouth 2 (two) times daily.   methocarbamol  (ROBAXIN ) 500 MG tablet Take 1 tablet (500 mg total) by mouth every 8 (eight) hours as needed for muscle spasms.   Multiple Vitamin (MULTIVITAMIN) tablet Take 1 tablet by mouth daily.   naproxen sodium (ALEVE) 220 MG tablet Take 220 mg by mouth daily.   oxymetazoline (AFRIN) 0.05 % nasal spray Place 1 spray into both nostrils 2 (two) times daily as needed for congestion.   rosuvastatin  (CRESTOR ) 5 MG tablet Take 5 mg by mouth 3 (three) times a week.   Meth-Hyo-M Bl-Na Phos-Ph Sal (URIBEL ) 118 MG CAPS Take 1 capsule (118 mg total) by mouth 3 (three) times daily as needed (Urinary frequency, urgency, burning). (Patient not taking: Reported on 09/01/2023)   No current facility-administered medications for this visit. (Other)   REVIEW OF SYSTEMS:    ALLERGIES Allergies  Allergen Reactions   Food Color Red Anaphylaxis    Red delicious apple skins   Other     Red Delicious Apple skins causes anaphylaxis     Soy Allergy (Obsolete) Other (See Comments)    Headache  Zocor [Simvastatin] Other (See Comments)    Leg pain   PAST MEDICAL HISTORY Past Medical History:  Diagnosis Date   Allergic state    Arthritis    BPH (benign prostatic hyperplasia)    Bursitis    hips   Cancer (HCC)    basal and squamous cell   Cataracts, bilateral    ED (erectile dysfunction)    Elevated PSA    Heart murmur    born with it but states he grew out of it   Hemorrhoids    History of chicken pox    History of colon polyps    History of kidney stones    Hypercholesteremia    Hypogonadism in male    Nephrolithiasis    PIN III (prostatic intraepithelial neoplasia III)     Spinal stenosis of lumbar region    Umbilical hernia    Past Surgical History:  Procedure Laterality Date   ANKLE FRACTURE SURGERY Left 10/10/2007   CARPAL TUNNEL RELEASE Right 2007   CATARACT EXTRACTION, BILATERAL Bilateral    COLONOSCOPY     1993, 1996, 2000, 2005,2010, 2015, 2018   COLONOSCOPY WITH PROPOFOL  N/A 09/21/2016   Procedure: COLONOSCOPY WITH PROPOFOL ;  Surgeon: Viktoria Lamar DASEN, MD;  Location: [facility] ENDOSCOPY;  Service: Endoscopy;  Laterality: N/A;   CYSTOSCOPY W/ RETROGRADES  04/13/2021   Procedure: CYSTOSCOPY WITH RETROGRADE PYELOGRAM;  Surgeon: Twylla Glendia BROCKS, MD;  Location: [facility] ORS;  Service: Urology;;   CYSTOSCOPY WITH INSERTION OF UROLIFT N/A 07/27/2021   Procedure: CYSTOSCOPY WITH INSERTION OF UROLIFT;  Surgeon: Twylla Glendia BROCKS, MD;  Location: [facility] ORS;  Service: Urology;  Laterality: N/A;   CYSTOSCOPY/URETEROSCOPY/HOLMIUM LASER/STENT PLACEMENT Right 04/13/2021   Procedure: CYSTOSCOPY/URETEROSCOPY/HOLMIUM LASER/STENT PLACEMENT;  Surgeon: Twylla Glendia BROCKS, MD;  Location: [facility] ORS;  Service: Urology;  Laterality: Right;   FEMUR FRACTURE SURGERY Left 10/10/2007   FRACTURE SURGERY Left 10/10/2007   ORIF DISTAL RADIUS FRACTURE AND PERTROCHANTERIC FEMUR FRACTURE   HERNIA REPAIR Bilateral 1964   KNEE SURGERY Left 1984   torn meniscus   KNEE SURGERY Right 2005   torn meniscus   LUMBAR LAMINECTOMY/DECOMPRESSION MICRODISCECTOMY N/A 07/02/2021   Procedure: L2-S1 DECOMPRESSION;  Surgeon: Clois Fret, MD;  Location: [facility] ORS;  Service: Neurosurgery;  Laterality: N/A;   VITRECTOMY 25 GAUGE WITH SCLERAL BUCKLE Right 08/31/2023   Procedure: VITRECTOMY, USING 25-GAUGE INSTRUMENTS, WITH ENDOLASER & GAS;  Surgeon: Valdemar Rogue, MD;  Location: [facility] OR;  Service: Ophthalmology;  Laterality: Right;   FAMILY HISTORY History reviewed. No pertinent family history. SOCIAL HISTORY Social History   Tobacco Use   Smoking status: Former    Types: Pipe    Quit date: 05/15/1978     Years since quitting: 45.3   Smokeless tobacco: Never  Vaping Use   Vaping status: Never Used  Substance Use Topics   Alcohol use: No   Drug use: No       OPHTHALMIC EXAM:  Base Eye Exam     Visual Acuity (Snellen - Linear)       Right Left   Dist cc HM     Correction: Glasses         Tonometry (Tonopen, 7:57 AM)       Right Left   Pressure 22 def         Pupils       Dark Light Shape React APD   Right Dilated       Left 3 2 Round Brisk None  Visual Fields (Counting fingers)       Left Right    Full    Restrictions  Total inferior temporal, inferior nasal deficiencies; Partial inner superior temporal, superior nasal deficiencies         Extraocular Movement       Right Left    Full, Ortho Full, Ortho         Neuro/Psych     Oriented x3: Yes   Mood/Affect: Normal         Dilation     Right eye: 1.0% Mydriacyl , 2.5% Phenylephrine  @ 7:57 AM           Slit Lamp and Fundus Exam     External Exam       Right Left   External Normal Normal         Slit Lamp Exam       Right Left   Lids/Lashes Mild upper lid edema, Dermatochalasis Dermatochalasis, mild MGD   Conjunctiva/Sclera 360 sub conj heme, sutures intact White and quiet   Cornea Well healed cataract wound, tear film debris, fine endo pigment and endo heme, 1+ Guttata, nasal and temporal LRI, 1-2+ PEE Well healed temporal cataract wound, tear film debris, trace PEE, nasal and temporal LRI, 2-3+ Guttata   Anterior Chamber Deep, 0.5+fine cell/pigment Deep, 3+fine cell and pigment   Iris Round and dilated Round and reactive   Lens 3 piece PC IOL in good position w/ open PC 3 piece PC IOL in good position w/ open PC   Anterior Vitreous Post vit, good gas fill Vitreous syneresis         Fundus Exam       Right Left   Disc Mild pallor, sharp rim    C/D Ratio 0.4    Macula Flat under gas    Vessels Attenuated, mild tortuosity    Periphery Periphery attached under  gas, good laser changes 360, posterior retinotomy with good laser surrounding nasal to disc Pre OP: Bullous superior detachment from [phone], hole at 1200, pigmented CR scarring at 11 o'clock            Refraction     Wearing Rx       Sphere Cylinder Axis Add   Right -1.25 +1.50 179 +2.50   Left -0.50 +1.00 161 +2.50    Type: Progressive           IMAGING AND PROCEDURES  Imaging and Procedures for 09/01/2023          ASSESSMENT/PLAN:   ICD-10-CM   1. Right retinal detachment  H33.21     2. Epiretinal membrane (ERM) of both eyes  H35.373     3. Essential hypertension  I10     4. Hypertensive retinopathy of both eyes  H35.033     5. Pseudophakia of both eyes  Z96.1      Rhegmatogenous retinal detachment, OD - Bullous superior detachment w/ extension inferiorly through fovea, onset ~08.01.25 by history - s/p pneumatic cryopexy OD on 08.11.25 - central macula reattached, but residual SRF remained in periphery around gas bubble - now POD1 s/p PPV/PFO/EL/FAX/14% C3F8 OD, 8.21.25             - doing well this morning             - retina attached and in good position -- good peripheral laser 360 and around retinotomies             - IOP mildly elevated to 22             -  start   PF 4x/day OD  zymaxid  QID OD  Atropine  BID OD  Brimonidine  BID OD PSO ung QID OD              - cont face down positioning x7 days; avoid laying flat on back              - eye shield when sleeping              - post op drop and positioning instructions reviewed              - tylenol /ibuprofen for pain              - Rx given for breakthrough pain  - f/u 09/07/2023 - POV/DFE  2. Epiretinal membrane, OU - The natural history, anatomy, potential for loss of vision, and treatment options including vitrectomy techniques and the complications of endophthalmitis, retinal detachment, vitreous hemorrhage, cataract progression and permanent vision loss discussed with the patient. - mild ERM  OU - no indication for surgery at this time - monitor for now  3,4. Hypertensive retinopathy OU - discussed importance of tight BP control - monitor   5. Pseudophakia OU  - s/p CE/IOL  - IOL in good position, doing well  - monitor   Ophthalmic Meds Ordered this visit:  No orders of the defined types were placed in this encounter.    Return in about 6 days (around 09/07/2023) for POV s/p RD repair OD.  There are no Patient Instructions on file for this visit.  Explained the diagnoses, plan, and follow up with the patient and they expressed understanding.  Patient expressed understanding of the importance of proper follow up care.   This document serves as a record of services personally performed by [name], MD, PhD. It was created on their behalf by Avelina Pereyra, COA an ophthalmic technician. The creation of this record is the provider's dictation and/or activities during the visit.   Electronically signed by: Avelina GORMAN Pereyra, COT  09/05/23  11:44 PM   This document serves as a record of services personally performed by [name], MD, PhD. It was created on their behalf by Auston Muzzy, COMT. The creation of this record is the provider's dictation and/or activities during the visit.  Electronically signed by: Auston Muzzy, COMT 09/05/23 11:44 PM  [name], M.D., Ph.D. Diseases & Surgery of the Retina and Vitreous [facility]  I have reviewed the above documentation for accuracy and completeness, and I agree with the above. [name], M.D., Ph.D. 09/05/23 11:47 PM   Abbreviations: M myopia (nearsighted); A astigmatism; H hyperopia (farsighted); P presbyopia; Mrx spectacle prescription;  CTL contact lenses; OD right eye; OS left eye; OU both eyes  XT exotropia; ET esotropia; PEK punctate epithelial keratitis; PEE punctate epithelial erosions; DES dry eye syndrome; MGD meibomian gland dysfunction; ATs artificial tears; PFAT's  preservative free artificial tears; NSC nuclear sclerotic cataract; PSC posterior subcapsular cataract; ERM epi-retinal membrane; PVD posterior vitreous detachment; RD retinal detachment; DM diabetes mellitus; DR diabetic retinopathy; NPDR non-proliferative diabetic retinopathy; PDR proliferative diabetic retinopathy; CSME clinically significant macular edema; DME diabetic macular edema; dbh dot blot hemorrhages; CWS cotton wool spot; POAG primary open angle glaucoma; C/D cup-to-disc ratio; HVF humphrey visual field; GVF goldmann visual field; OCT optical coherence tomography; IOP intraocular pressure; BRVO Branch retinal vein occlusion; CRVO central retinal vein occlusion; CRAO central retinal artery occlusion; BRAO branch retinal artery occlusion; RT  retinal tear; SB scleral buckle; PPV pars plana vitrectomy; VH Vitreous hemorrhage; PRP panretinal laser photocoagulation; IVK intravitreal kenalog ; VMT vitreomacular traction; MH Macular hole;  NVD neovascularization of the disc; NVE neovascularization elsewhere; AREDS age related eye disease study; ARMD age related macular degeneration; POAG primary open angle glaucoma; EBMD epithelial/anterior basement membrane dystrophy; ACIOL anterior chamber intraocular lens; IOL intraocular lens; PCIOL posterior chamber intraocular lens; Phaco/IOL phacoemulsification with intraocular lens placement; PRK photorefractive keratectomy; LASIK laser assisted in situ keratomileusis; HTN hypertension; DM diabetes mellitus; COPD chronic obstructive pulmonary disease

## 2023-09-05 ENCOUNTER — Encounter (INDEPENDENT_AMBULATORY_CARE_PROVIDER_SITE_OTHER): Payer: Self-pay | Admitting: Ophthalmology

## 2023-09-06 NOTE — Progress Notes (Signed)
 [facility] - Clinic Note  09/07/2023   CHIEF COMPLAINT Patient presents for Post-op Follow-up  HISTORY OF PRESENT ILLNESS: Devin Landry is a 83 y.o. male who presents to the clinic today for:  HPI     Post-op Follow-up   In right eye.  Discomfort includes floaters.  Negative for pain, itching, foreign body sensation, tearing, discharge and none.  I, the attending physician,  performed the HPI with the patient and updated documentation appropriately.        Comments   RD OD, s/p pneumatic cryopexy 08.11.25. Patient states when he looks up he can see 270 degrees of a bubble in his eye. Pt is spending 83min/hour face down. Pt has been consistent with PF 4x/day OD zymaxid  QID OD Atropine  BID OD Brimonidine  BID OD PSO ung QID OD and uses refresh in OS bid.      Last edited by Valdemar Rogue, MD on 09/07/2023  3:22 PM.     Patient states he's doing alright maintaining the face down position. Feels he's had a little bit of vision in OD. Can see shapes and colors.   Referring physician: Karolynn Drivers, OD [facility], [place]  HISTORICAL INFORMATION:  Selected notes from the MEDICAL RECORD NUMBER Referred by Dr. Drivers Karolynn, OD for retinoschisis vs RD OD LEE:  Ocular Hx- pseudophakia OU (Epes) PMH-   CURRENT MEDICATIONS: Current Outpatient Medications (Ophthalmic Drugs)  Medication Sig   ofloxacin  (OCUFLOX ) 0.3 % ophthalmic solution Place 1 drop into the right eye 4 (four) times daily.   prednisoLONE  acetate (PRED FORTE ) 1 % ophthalmic suspension Place 1 drop into the right eye 4 (four) times daily.   neomycin-polymyxin-dexameth (MAXITROL ) 0.1 % OINT Place 1 Application into the right eye 4 (four) times daily.   prednisoLONE  acetate (PRED FORTE ) 1 % ophthalmic suspension Place 1 drop into the left eye 4 (four) times daily.   No current facility-administered medications for this visit. (Ophthalmic Drugs)   Current Outpatient Medications (Other)   Medication Sig   acetaminophen  (TYLENOL ) 650 MG CR tablet Take 650 mg by mouth 2 (two) times daily.   Calcium  Citrate-Vitamin D (CALCIUM  CITRATE + D PO) Take 1 tablet by mouth 2 (two) times daily. Calcium  citrate 500 mg Vitamin D3 400 IU   Cholecalciferol (EQL VITAMIN D3) 25 MCG (1000 UT) capsule Take 1,000 Units by mouth daily.   Cyanocobalamin (B-12 PO) Take 1 tablet by mouth 4 (four) times a week.   diphenhydramine-acetaminophen  (TYLENOL  PM) 25-500 MG TABS tablet Take 1 tablet by mouth at bedtime.   docusate sodium  (COLACE) 100 MG capsule Take 100 mg by mouth daily.   gabapentin  (NEURONTIN ) 100 MG capsule Take 300 mg by mouth at bedtime.   Glucosamine-Chondroit-Vit C-Mn (GLUCOSAMINE 1500 COMPLEX PO) Take 1,500 mg by mouth 2 (two) times daily.   Meth-Hyo-M Bl-Na Phos-Ph Sal (URIBEL ) 118 MG CAPS Take 1 capsule (118 mg total) by mouth 3 (three) times daily as needed (Urinary frequency, urgency, burning). (Patient not taking: Reported on 09/01/2023)   methocarbamol  (ROBAXIN ) 500 MG tablet Take 1 tablet (500 mg total) by mouth every 8 (eight) hours as needed for muscle spasms.   Multiple Vitamin (MULTIVITAMIN) tablet Take 1 tablet by mouth daily.   naproxen sodium (ALEVE) 220 MG tablet Take 220 mg by mouth daily.   oxymetazoline (AFRIN) 0.05 % nasal spray Place 1 spray into both nostrils 2 (two) times daily as needed for congestion.   rosuvastatin  (CRESTOR ) 5 MG tablet Take  5 mg by mouth 3 (three) times a week.   No current facility-administered medications for this visit. (Other)   REVIEW OF SYSTEMS: ROS   Positive for: Musculoskeletal, Eyes Negative for: Constitutional, Gastrointestinal, Neurological, Skin, Genitourinary, HENT, Endocrine, Cardiovascular, Respiratory, Psychiatric, Allergic/Imm, Heme/Lymph Last edited by Elnor Avelina RAMAN, COT on 09/07/2023  9:07 AM.       ALLERGIES Allergies  Allergen Reactions   Food Color Red Anaphylaxis    Red delicious apple skins   Other     Red  Delicious Apple skins causes anaphylaxis     Soy Allergy (Obsolete) Other (See Comments)    Headache    Zocor [Simvastatin] Other (See Comments)    Leg pain   PAST MEDICAL HISTORY Past Medical History:  Diagnosis Date   Allergic state    Arthritis    BPH (benign prostatic hyperplasia)    Bursitis    hips   Cancer (HCC)    basal and squamous cell   Cataracts, bilateral    ED (erectile dysfunction)    Elevated PSA    Heart murmur    born with it but states he grew out of it   Hemorrhoids    History of chicken pox    History of colon polyps    History of kidney stones    Hypercholesteremia    Hypogonadism in male    Nephrolithiasis    PIN III (prostatic intraepithelial neoplasia III)    Spinal stenosis of lumbar region    Umbilical hernia    Past Surgical History:  Procedure Laterality Date   ANKLE FRACTURE SURGERY Left 10/10/2007   CARPAL TUNNEL RELEASE Right 2007   CATARACT EXTRACTION, BILATERAL Bilateral    COLONOSCOPY     1993, 1996, 2000, 2005,2010, 2015, 2018   COLONOSCOPY WITH PROPOFOL  N/A 09/21/2016   Procedure: COLONOSCOPY WITH PROPOFOL ;  Surgeon: Viktoria Lamar DASEN, MD;  Location: [facility] ENDOSCOPY;  Service: Endoscopy;  Laterality: N/A;   CYSTOSCOPY W/ RETROGRADES  04/13/2021   Procedure: CYSTOSCOPY WITH RETROGRADE PYELOGRAM;  Surgeon: Twylla Glendia BROCKS, MD;  Location: [facility] ORS;  Service: Urology;;   CYSTOSCOPY WITH INSERTION OF UROLIFT N/A 07/27/2021   Procedure: CYSTOSCOPY WITH INSERTION OF UROLIFT;  Surgeon: Twylla Glendia BROCKS, MD;  Location: [facility] ORS;  Service: Urology;  Laterality: N/A;   CYSTOSCOPY/URETEROSCOPY/HOLMIUM LASER/STENT PLACEMENT Right 04/13/2021   Procedure: CYSTOSCOPY/URETEROSCOPY/HOLMIUM LASER/STENT PLACEMENT;  Surgeon: Twylla Glendia BROCKS, MD;  Location: [facility] ORS;  Service: Urology;  Laterality: Right;   FEMUR FRACTURE SURGERY Left 10/10/2007   FRACTURE SURGERY Left 10/10/2007   ORIF DISTAL RADIUS FRACTURE AND PERTROCHANTERIC FEMUR FRACTURE    HERNIA REPAIR Bilateral 1964   KNEE SURGERY Left 1984   torn meniscus   KNEE SURGERY Right 2005   torn meniscus   LUMBAR LAMINECTOMY/DECOMPRESSION MICRODISCECTOMY N/A 07/02/2021   Procedure: L2-S1 DECOMPRESSION;  Surgeon: Clois Fret, MD;  Location: [facility] ORS;  Service: Neurosurgery;  Laterality: N/A;   VITRECTOMY 25 GAUGE WITH SCLERAL BUCKLE Right 08/31/2023   Procedure: VITRECTOMY, USING 25-GAUGE INSTRUMENTS, WITH ENDOLASER & GAS;  Surgeon: Valdemar Rogue, MD;  Location: [facility] OR;  Service: Ophthalmology;  Laterality: Right;   FAMILY HISTORY History reviewed. No pertinent family history. SOCIAL HISTORY Social History   Tobacco Use   Smoking status: Former    Types: Pipe    Quit date: 05/15/1978    Years since quitting: 45.3   Smokeless tobacco: Never  Vaping Use   Vaping status: Never Used  Substance Use Topics   Alcohol use: No  Drug use: No       OPHTHALMIC EXAM:  Base Eye Exam     Visual Acuity (Snellen - Linear)       Right Left   Dist cc HM 20/25 -2   Dist ph cc NI NI    Correction: Glasses         Tonometry (Tonopen, 9:01 AM)       Right Left   Pressure 20 17         Pupils       Dark Light Shape React APD   Right 6 6 Round NR None   Left 3 2 Round Brisk NR         Visual Fields       Left Right    Full    Restrictions  Total inferior temporal, superior nasal, inferior nasal deficiencies         Extraocular Movement       Right Left    Full, Ortho Full, Ortho         Neuro/Psych     Oriented x3: Yes   Mood/Affect: Normal         Dilation     Right eye: already dilated            Slit Lamp and Fundus Exam     External Exam       Right Left   External Normal Normal         Slit Lamp Exam       Right Left   Lids/Lashes Dermatochalasis Dermatochalasis, mild MGD   Conjunctiva/Sclera 360 sub conj heme-improving, sutures intact White and quiet   Cornea Central epi defect 67mmVx7.5mmH; 3+ PEE, +endo pigment  Well healed cataract wound Well healed temporal cataract wound, tear film debris, trace PEE, nasal and temporal LRI, 2-3+ Guttata   Anterior Chamber Deep and clear Deep, 3+fine cell and pigment   Iris Round and dilated Round and reactive   Lens 3 piece PC IOL in good position w/ open PC 3 piece PC IOL in good position w/ open PC   Anterior Vitreous Post vit, good gas fill Vitreous syneresis         Fundus Exam       Right Left   Disc Mild pallor, sharp rim    C/D Ratio 0.4    Macula Flat under gas    Vessels Attenuated, mild tortuosity    Periphery Hazy view, Periphery attached under gas, good laser changes 360, posterior retinotomy with good laser surrounding nasal to disc; PRE-OP: Bullous superior detachment from [phone], hole at 1200, pigmented CR scarring at 11 o'clock            Refraction     Wearing Rx       Sphere Cylinder Axis Add   Right -1.25 +1.50 179 +2.50   Left -0.50 +1.00 161 +2.50    Type: Progressive           IMAGING AND PROCEDURES  Imaging and Procedures for 09/07/2023          ASSESSMENT/PLAN:   ICD-10-CM   1. Right retinal detachment  H33.21 CANCELED: OCT, Retina - OU - Both Eyes    2. Epiretinal membrane (ERM) of both eyes  H35.373     3. Essential hypertension  I10     4. Hypertensive retinopathy of both eyes  H35.033     5. Pseudophakia of both eyes  Z96.1       Rhegmatogenous retinal detachment,  OD - Bullous superior detachment w/ extension inferiorly through fovea, onset ~08.01.25 by history - s/p pneumatic cryopexy OD on 08.11.25 - central macula reattached, but residual SRF remained in periphery around gas bubble - now POW1 s/p PPV/PFO/EL/FAX/14% C3F8 OD, 8.21.25             - doing well             - retina attached and in good position -- good peripheral laser 360 and around retinotomies  - central epi defect -- BCL placed today, 08.28.25              - IOP 20             - cont PF 4x/day OD--conintue, refill  sent zymaxid  QID OD -- sent Ofloxacin  to pharmacy for cheaper alternative. Brimonidine  BID OD - stop Atropine  BID OD--OK to stop today PSO ung QID OD-hold off using while BCL is in place             - cont face down positioning 94min/hr; avoid laying flat on back              - eye shield when sleeping              - post op drop and positioning instructions reviewed              - tylenol /ibuprofen for pain   - f/u 9.2.25 - POV / BCL check  2. Epiretinal membrane, OU - The natural history, anatomy, potential for loss of vision, and treatment options including vitrectomy techniques and the complications of endophthalmitis, retinal detachment, vitreous hemorrhage, cataract progression and permanent vision loss discussed with the patient. - mild ERM OU - no indication for surgery at this time - monitor for now  3,4. Hypertensive retinopathy OU - discussed importance of tight BP control - monitor   5. Pseudophakia OU  - s/p CE/IOL  - IOL in good position, doing well  - monitor   Ophthalmic Meds Ordered this visit:  Meds ordered this encounter  Medications   prednisoLONE  acetate (PRED FORTE ) 1 % ophthalmic suspension    Sig: Place 1 drop into the right eye 4 (four) times daily.    Dispense:  10 mL    Refill:  3   ofloxacin  (OCUFLOX ) 0.3 % ophthalmic solution    Sig: Place 1 drop into the right eye 4 (four) times daily.    Dispense:  5 mL    Refill:  0     Return in about 5 days (around 09/12/2023) for POV RD OD, DFE, No OCT.  There are no Patient Instructions on file for this visit.  Explained the diagnoses, plan, and follow up with the patient and they expressed understanding.  Patient expressed understanding of the importance of proper follow up care.   This document serves as a record of services personally performed by [name], MD, PhD. It was created on their behalf by Avelina Pereyra, COA an ophthalmic technician. The creation of this record is the provider's  dictation and/or activities during the visit.   Electronically signed by: Avelina GORMAN Pereyra, COT  09/07/23  3:23 PM   This document serves as a record of services personally performed by [name], MD, PhD. It was created on their behalf by Almetta Pesa, an ophthalmic technician. The creation of this record is the provider's dictation and/or activities during the visit.    Electronically signed by: Almetta Pesa, OA, 09/07/23  3:23 PM   [name], M.D., Ph.D. Diseases & Surgery of the Retina and Vitreous [facility]  I have reviewed the above documentation for accuracy and completeness, and I agree with the above. [name], M.D., Ph.D. 09/07/23 4:27 PM   Abbreviations: M myopia (nearsighted); A astigmatism; H hyperopia (farsighted); P presbyopia; Mrx spectacle prescription;  CTL contact lenses; OD right eye; OS left eye; OU both eyes  XT exotropia; ET esotropia; PEK punctate epithelial keratitis; PEE punctate epithelial erosions; DES dry eye syndrome; MGD meibomian gland dysfunction; ATs artificial tears; PFAT's preservative free artificial tears; NSC nuclear sclerotic cataract; PSC posterior subcapsular cataract; ERM epi-retinal membrane; PVD posterior vitreous detachment; RD retinal detachment; DM diabetes mellitus; DR diabetic retinopathy; NPDR non-proliferative diabetic retinopathy; PDR proliferative diabetic retinopathy; CSME clinically significant macular edema; DME diabetic macular edema; dbh dot blot hemorrhages; CWS cotton wool spot; POAG primary open angle glaucoma; C/D cup-to-disc ratio; HVF humphrey visual field; GVF goldmann visual field; OCT optical coherence tomography; IOP intraocular pressure; BRVO Branch retinal vein occlusion; CRVO central retinal vein occlusion; CRAO central retinal artery occlusion; BRAO branch retinal artery occlusion; RT retinal tear; SB scleral buckle; PPV pars plana vitrectomy; VH Vitreous hemorrhage; PRP  panretinal laser photocoagulation; IVK intravitreal kenalog ; VMT vitreomacular traction; MH Macular hole;  NVD neovascularization of the disc; NVE neovascularization elsewhere; AREDS age related eye disease study; ARMD age related macular degeneration; POAG primary open angle glaucoma; EBMD epithelial/anterior basement membrane dystrophy; ACIOL anterior chamber intraocular lens; IOL intraocular lens; PCIOL posterior chamber intraocular lens; Phaco/IOL phacoemulsification with intraocular lens placement; PRK photorefractive keratectomy; LASIK laser assisted in situ keratomileusis; HTN hypertension; DM diabetes mellitus; COPD chronic obstructive pulmonary disease

## 2023-09-07 ENCOUNTER — Ambulatory Visit (INDEPENDENT_AMBULATORY_CARE_PROVIDER_SITE_OTHER): Admitting: Ophthalmology

## 2023-09-07 ENCOUNTER — Encounter (INDEPENDENT_AMBULATORY_CARE_PROVIDER_SITE_OTHER): Payer: Self-pay | Admitting: Ophthalmology

## 2023-09-07 DIAGNOSIS — I10 Essential (primary) hypertension: Secondary | ICD-10-CM

## 2023-09-07 DIAGNOSIS — Z961 Presence of intraocular lens: Secondary | ICD-10-CM

## 2023-09-07 DIAGNOSIS — H35033 Hypertensive retinopathy, bilateral: Secondary | ICD-10-CM

## 2023-09-07 DIAGNOSIS — H35373 Puckering of macula, bilateral: Secondary | ICD-10-CM

## 2023-09-07 DIAGNOSIS — H3321 Serous retinal detachment, right eye: Secondary | ICD-10-CM

## 2023-09-07 MED ORDER — PREDNISOLONE ACETATE 1 % OP SUSP
1.0000 [drp] | Freq: Four times a day (QID) | OPHTHALMIC | 3 refills | Status: AC
Start: 2023-09-07 — End: ?

## 2023-09-07 MED ORDER — OFLOXACIN 0.3 % OP SOLN: 1.0000 [drp] | Freq: Four times a day (QID) | OPHTHALMIC | 0 refills | Status: AC

## 2023-09-12 ENCOUNTER — Ambulatory Visit (INDEPENDENT_AMBULATORY_CARE_PROVIDER_SITE_OTHER): Admitting: Ophthalmology

## 2023-09-12 DIAGNOSIS — I10 Essential (primary) hypertension: Secondary | ICD-10-CM

## 2023-09-12 DIAGNOSIS — H35033 Hypertensive retinopathy, bilateral: Secondary | ICD-10-CM

## 2023-09-12 DIAGNOSIS — H3321 Serous retinal detachment, right eye: Secondary | ICD-10-CM

## 2023-09-12 DIAGNOSIS — H35373 Puckering of macula, bilateral: Secondary | ICD-10-CM

## 2023-09-12 DIAGNOSIS — Z961 Presence of intraocular lens: Secondary | ICD-10-CM

## 2023-09-12 NOTE — Progress Notes (Incomplete)
 [facility] - Clinic Note  09/12/2023   CHIEF COMPLAINT Patient presents for Post-op Follow-up  HISTORY OF PRESENT ILLNESS: Devin Landry is a 83 y.o. male who presents to the clinic today for:  HPI     Post-op Follow-up   In right eye.  Discomfort includes floaters.  Negative for pain, itching, foreign body sensation, tearing, discharge and none.  I, the attending physician,  performed the HPI with the patient and updated documentation appropriately.        Comments   Patient feels the vision is a little better. The vision seems like looking through muddy water .  He is using PF 4x/day OD, Ofloxacin  OD QID, Brimonidine  BID OD.        Last edited by Valdemar Rogue, MD on 09/14/2023  6:38 PM.    Patient states things are stable, hasn't noticed CTL. Has kept up w/ drops.   Referring physician: Karolynn Drivers, OD [facility], [place]  HISTORICAL INFORMATION:  Selected notes from the MEDICAL RECORD NUMBER Referred by Dr. Drivers Karolynn, OD for retinoschisis vs RD OD LEE:  Ocular Hx- pseudophakia OU (Epes) PMH-   CURRENT MEDICATIONS: Current Outpatient Medications (Ophthalmic Drugs)  Medication Sig   neomycin-polymyxin-dexameth (MAXITROL ) 0.1 % OINT Place 1 Application into the right eye 4 (four) times daily.   ofloxacin  (OCUFLOX ) 0.3 % ophthalmic solution Place 1 drop into the right eye 4 (four) times daily.   prednisoLONE  acetate (PRED FORTE ) 1 % ophthalmic suspension Place 1 drop into the left eye 4 (four) times daily.   prednisoLONE  acetate (PRED FORTE ) 1 % ophthalmic suspension Place 1 drop into the right eye 4 (four) times daily.   No current facility-administered medications for this visit. (Ophthalmic Drugs)   Current Outpatient Medications (Other)  Medication Sig   acetaminophen  (TYLENOL ) 650 MG CR tablet Take 650 mg by mouth 2 (two) times daily.   Calcium  Citrate-Vitamin D (CALCIUM  CITRATE + D PO) Take 1 tablet by mouth 2 (two) times  daily. Calcium  citrate 500 mg Vitamin D3 400 IU   Cholecalciferol (EQL VITAMIN D3) 25 MCG (1000 UT) capsule Take 1,000 Units by mouth daily.   Cyanocobalamin (B-12 PO) Take 1 tablet by mouth 4 (four) times a week.   diphenhydramine-acetaminophen  (TYLENOL  PM) 25-500 MG TABS tablet Take 1 tablet by mouth at bedtime.   docusate sodium  (COLACE) 100 MG capsule Take 100 mg by mouth daily.   gabapentin  (NEURONTIN ) 100 MG capsule Take 300 mg by mouth at bedtime.   Glucosamine-Chondroit-Vit C-Mn (GLUCOSAMINE 1500 COMPLEX PO) Take 1,500 mg by mouth 2 (two) times daily.   Meth-Hyo-M Bl-Na Phos-Ph Sal (URIBEL ) 118 MG CAPS Take 1 capsule (118 mg total) by mouth 3 (three) times daily as needed (Urinary frequency, urgency, burning). (Patient not taking: Reported on 09/01/2023)   methocarbamol  (ROBAXIN ) 500 MG tablet Take 1 tablet (500 mg total) by mouth every 8 (eight) hours as needed for muscle spasms.   Multiple Vitamin (MULTIVITAMIN) tablet Take 1 tablet by mouth daily.   naproxen sodium (ALEVE) 220 MG tablet Take 220 mg by mouth daily.   oxymetazoline (AFRIN) 0.05 % nasal spray Place 1 spray into both nostrils 2 (two) times daily as needed for congestion.   rosuvastatin  (CRESTOR ) 5 MG tablet Take 5 mg by mouth 3 (three) times a week.   No current facility-administered medications for this visit. (Other)   REVIEW OF SYSTEMS: ROS   Positive for: Musculoskeletal, Eyes Negative for: Constitutional, Gastrointestinal, Neurological, Skin, Genitourinary,  HENT, Endocrine, Cardiovascular, Respiratory, Psychiatric, Allergic/Imm, Heme/Lymph Last edited by Myra Wanda SAILOR, COT on 09/12/2023  7:48 AM.        ALLERGIES Allergies  Allergen Reactions   Food Color Red Anaphylaxis    Red delicious apple skins   Other     Red Delicious Apple skins causes anaphylaxis     Soy Allergy (Obsolete) Other (See Comments)    Headache    Zocor [Simvastatin] Other (See Comments)    Leg pain   PAST MEDICAL  HISTORY Past Medical History:  Diagnosis Date   Allergic state    Arthritis    BPH (benign prostatic hyperplasia)    Bursitis    hips   Cancer (HCC)    basal and squamous cell   Cataracts, bilateral    ED (erectile dysfunction)    Elevated PSA    Heart murmur    born with it but states he grew out of it   Hemorrhoids    History of chicken pox    History of colon polyps    History of kidney stones    Hypercholesteremia    Hypogonadism in male    Nephrolithiasis    PIN III (prostatic intraepithelial neoplasia III)    Spinal stenosis of lumbar region    Umbilical hernia    Past Surgical History:  Procedure Laterality Date   ANKLE FRACTURE SURGERY Left 10/10/2007   CARPAL TUNNEL RELEASE Right 2007   CATARACT EXTRACTION, BILATERAL Bilateral    COLONOSCOPY     1993, 1996, 2000, 2005,2010, 2015, 2018   COLONOSCOPY WITH PROPOFOL  N/A 09/21/2016   Procedure: COLONOSCOPY WITH PROPOFOL ;  Surgeon: Viktoria Lamar DASEN, MD;  Location: [facility] ENDOSCOPY;  Service: Endoscopy;  Laterality: N/A;   CYSTOSCOPY W/ RETROGRADES  04/13/2021   Procedure: CYSTOSCOPY WITH RETROGRADE PYELOGRAM;  Surgeon: Twylla Glendia BROCKS, MD;  Location: [facility] ORS;  Service: Urology;;   CYSTOSCOPY WITH INSERTION OF UROLIFT N/A 07/27/2021   Procedure: CYSTOSCOPY WITH INSERTION OF UROLIFT;  Surgeon: Twylla Glendia BROCKS, MD;  Location: [facility] ORS;  Service: Urology;  Laterality: N/A;   CYSTOSCOPY/URETEROSCOPY/HOLMIUM LASER/STENT PLACEMENT Right 04/13/2021   Procedure: CYSTOSCOPY/URETEROSCOPY/HOLMIUM LASER/STENT PLACEMENT;  Surgeon: Twylla Glendia BROCKS, MD;  Location: [facility] ORS;  Service: Urology;  Laterality: Right;   FEMUR FRACTURE SURGERY Left 10/10/2007   FRACTURE SURGERY Left 10/10/2007   ORIF DISTAL RADIUS FRACTURE AND PERTROCHANTERIC FEMUR FRACTURE   HERNIA REPAIR Bilateral 1964   KNEE SURGERY Left 1984   torn meniscus   KNEE SURGERY Right 2005   torn meniscus   LUMBAR LAMINECTOMY/DECOMPRESSION MICRODISCECTOMY N/A 07/02/2021    Procedure: L2-S1 DECOMPRESSION;  Surgeon: Clois Fret, MD;  Location: [facility] ORS;  Service: Neurosurgery;  Laterality: N/A;   VITRECTOMY 25 GAUGE WITH SCLERAL BUCKLE Right 08/31/2023   Procedure: VITRECTOMY, USING 25-GAUGE INSTRUMENTS, WITH ENDOLASER & GAS;  Surgeon: Valdemar Rogue, MD;  Location: [facility] OR;  Service: Ophthalmology;  Laterality: Right;   FAMILY HISTORY History reviewed. No pertinent family history. SOCIAL HISTORY Social History   Tobacco Use   Smoking status: Former    Types: Pipe    Quit date: 05/15/1978    Years since quitting: 45.3   Smokeless tobacco: Never  Vaping Use   Vaping status: Never Used  Substance Use Topics   Alcohol use: No   Drug use: No       OPHTHALMIC EXAM:  Base Eye Exam     Visual Acuity (Snellen - Linear)       Right Left   Dist cc  CF at 1' 20/25    Correction: Glasses         Tonometry (Tonopen, 7:53 AM)       Right Left   Pressure 22 15         Pupils       Dark Light Shape React APD   Right dilated       Left 3 2 Round Slow None         Visual Fields       Left Right    Full    Restrictions  Partial outer inferior temporal, superior nasal, inferior nasal deficiencies         Extraocular Movement       Right Left    Full, Ortho Full, Ortho         Neuro/Psych     Oriented x3: Yes   Mood/Affect: Normal         Dilation     Right eye: 1.0% Mydriacyl , 2.5% Phenylephrine  @ 7:51 AM           Slit Lamp and Fundus Exam     External Exam       Right Left   External Normal Normal         Slit Lamp Exam       Right Left   Lids/Lashes Dermatochalasis Dermatochalasis, mild MGD   Conjunctiva/Sclera 360 sub conj heme-improving, sutures intact White and quiet   Cornea BCL in place, Central epi defect 66mmVx7.5mmH--closed; 2-3+ PEE, +endo pigment, 1+ Decemets folds, Well healed cataract wound Well healed temporal cataract wound, tear film debris, trace PEE, nasal and temporal LRI, 2-3+ Guttata    Anterior Chamber Deep and clear Deep, 3+fine cell and pigment   Iris Round and dilated Round and reactive   Lens 3 piece PC IOL in good position w/ open PC 3 piece PC IOL in good position w/ open PC   Anterior Vitreous Post vit, good gas fill Vitreous syneresis         Fundus Exam       Right Left   Disc Mild pallor, sharp rim    C/D Ratio 0.4    Macula Flat under gas    Vessels Attenuated, mild tortuosity    Periphery Hazy view, Periphery attached under gas, good laser changes 360, posterior retinotomy with good laser surrounding nasal to disc; PRE-OP: Bullous superior detachment from [phone], hole at 1200, pigmented CR scarring at 11 o'clock            IMAGING AND PROCEDURES  Imaging and Procedures for 09/12/2023          ASSESSMENT/PLAN:   ICD-10-CM   1. Right retinal detachment  H33.21 CANCELED: OCT, Retina - OU - Both Eyes    2. Epiretinal membrane (ERM) of both eyes  H35.373     3. Essential hypertension  I10     4. Hypertensive retinopathy of both eyes  H35.033     5. Pseudophakia of both eyes  Z96.1      Rhegmatogenous retinal detachment, OD - Bullous superior detachment w/ extension inferiorly through fovea, onset ~08.01.25 by history - s/p pneumatic cryopexy OD on 08.11.25 - central macula reattached, but residual SRF remained in periphery around gas bubble - s/p PPV/PFO/EL/FAX/14% C3F8 OD, 8.21.25             - doing well             - retina attached and in good position -- good peripheral laser  360 and around retinotomies  - central epi defect - BCL placed on 08.28.25 - epi defect closed 09.02.25 - BCL remove             - IOP 22             - cont PF 4x/day OD--increase to 6x/day Zymaxid /ofloxacin -QID OD -- continue Brimonidine  BID OD--continue PSO ung QID OD-restart now that BCL is out.             - cont face down positioning 33min/hr; avoid laying flat on back, on Sunday can go down to 88min/hr.              - eye shield when sleeping               - post op drop and positioning instructions reviewed              - tylenol /ibuprofen for pain   - f/u 1wk - POV / K check  2. Epiretinal membrane, OU - The natural history, anatomy, potential for loss of vision, and treatment options including vitrectomy techniques and the complications of endophthalmitis, retinal detachment, vitreous hemorrhage, cataract progression and permanent vision loss discussed with the patient. - mild ERM OU - no indication for surgery at this time - monitor for now  3,4. Hypertensive retinopathy OU - discussed importance of tight BP control - monitor   5. Pseudophakia OU  - s/p CE/IOL  - IOL in good position, doing well  - monitor   Ophthalmic Meds Ordered this visit:  No orders of the defined types were placed in this encounter.    Return in about 1 week (around 09/19/2023) for POV RD OD, DFE, OCT.  There are no Patient Instructions on file for this visit.  Explained the diagnoses, plan, and follow up with the patient and they expressed understanding.  Patient expressed understanding of the importance of proper follow up care.    This document serves as a record of services personally performed by [name], MD, PhD. It was created on their behalf by Almetta Pesa, an ophthalmic technician. The creation of this record is the provider's dictation and/or activities during the visit.    Electronically signed by: Almetta Pesa, OA, 09/14/23  6:42 PM   [name], M.D., Ph.D. Diseases & Surgery of the Retina and Vitreous [facility]  I have reviewed the above documentation for accuracy and completeness, and I agree with the above. [name], M.D., Ph.D. 09/14/23 6:48 PM    Abbreviations: M myopia (nearsighted); A astigmatism; H hyperopia (farsighted); P presbyopia; Mrx spectacle prescription;  CTL contact lenses; OD right eye; OS left eye; OU both eyes  XT exotropia; ET esotropia; PEK punctate  epithelial keratitis; PEE punctate epithelial erosions; DES dry eye syndrome; MGD meibomian gland dysfunction; ATs artificial tears; PFAT's preservative free artificial tears; NSC nuclear sclerotic cataract; PSC posterior subcapsular cataract; ERM epi-retinal membrane; PVD posterior vitreous detachment; RD retinal detachment; DM diabetes mellitus; DR diabetic retinopathy; NPDR non-proliferative diabetic retinopathy; PDR proliferative diabetic retinopathy; CSME clinically significant macular edema; DME diabetic macular edema; dbh dot blot hemorrhages; CWS cotton wool spot; POAG primary open angle glaucoma; C/D cup-to-disc ratio; HVF humphrey visual field; GVF goldmann visual field; OCT optical coherence tomography; IOP intraocular pressure; BRVO Branch retinal vein occlusion; CRVO central retinal vein occlusion; CRAO central retinal artery occlusion; BRAO branch retinal artery occlusion; RT retinal tear; SB scleral buckle; PPV pars plana vitrectomy; VH Vitreous  hemorrhage; PRP panretinal laser photocoagulation; IVK intravitreal kenalog ; VMT vitreomacular traction; MH Macular hole;  NVD neovascularization of the disc; NVE neovascularization elsewhere; AREDS age related eye disease study; ARMD age related macular degeneration; POAG primary open angle glaucoma; EBMD epithelial/anterior basement membrane dystrophy; ACIOL anterior chamber intraocular lens; IOL intraocular lens; PCIOL posterior chamber intraocular lens; Phaco/IOL phacoemulsification with intraocular lens placement; PRK photorefractive keratectomy; LASIK laser assisted in situ keratomileusis; HTN hypertension; DM diabetes mellitus; COPD chronic obstructive pulmonary disease

## 2023-09-14 ENCOUNTER — Encounter (INDEPENDENT_AMBULATORY_CARE_PROVIDER_SITE_OTHER): Payer: Self-pay | Admitting: Ophthalmology

## 2023-09-20 ENCOUNTER — Ambulatory Visit (INDEPENDENT_AMBULATORY_CARE_PROVIDER_SITE_OTHER): Admitting: Ophthalmology

## 2023-09-20 DIAGNOSIS — H35033 Hypertensive retinopathy, bilateral: Secondary | ICD-10-CM

## 2023-09-20 DIAGNOSIS — H3321 Serous retinal detachment, right eye: Secondary | ICD-10-CM

## 2023-09-20 DIAGNOSIS — H35373 Puckering of macula, bilateral: Secondary | ICD-10-CM

## 2023-09-20 DIAGNOSIS — I10 Essential (primary) hypertension: Secondary | ICD-10-CM

## 2023-09-20 DIAGNOSIS — Z961 Presence of intraocular lens: Secondary | ICD-10-CM

## 2023-09-20 MED ORDER — BACITRACIN-POLYMYXIN B 500-10000 UNIT/GM OP OINT: TOPICAL_OINTMENT | Freq: Two times a day (BID) | OPHTHALMIC | 0 refills | Status: AC

## 2023-09-20 NOTE — Progress Notes (Signed)
 [facility] - Clinic Note  09/20/2023   CHIEF COMPLAINT Patient presents for Retina Follow Up  HISTORY OF PRESENT ILLNESS: Devin Landry is a 83 y.o. male who presents to the clinic today for:  HPI     Retina Follow Up   Patient presents with  Retinal Break/Detachment.  In right eye.  Severity is moderate.  Duration of 1 week.  Since onset it is gradually improving.  I, the attending physician,  performed the HPI with the patient and updated documentation appropriately.        Comments   Pt here for 1 wk f/u POV for RD OD. Pt states VA does seem to be improving, VA is better in the morning-before pt starts drops. Ability to see objects/fingers and hand usually w/o glasses on. Pt is on the 30 min/hr face down. Daughter is keeping gtts consistent- Pred 6x/day, Ofloxacin  QID OD, Brim BID OD, PSO QID OD.      Last edited by Dwayne Begay, MD on 09/28/2023  1:03 PM.     Patient states the vision is a little better in the am before drops.   Referring physician: Karolynn Drivers, OD [facility], [place]  HISTORICAL INFORMATION:  Selected notes from the MEDICAL RECORD NUMBER Referred by Dr. Drivers Karolynn, OD for retinoschisis vs RD OD LEE:  Ocular Hx- pseudophakia OU (Epes) PMH-   CURRENT MEDICATIONS: Current Outpatient Medications (Ophthalmic Drugs)  Medication Sig   bacitracin -polymyxin b  (POLYSPORIN ) ophthalmic ointment Place into the right eye 2 (two) times daily. Place a 1/2 inch ribbon of ointment into the lower eyelid.   ofloxacin  (OCUFLOX ) 0.3 % ophthalmic solution Place 1 drop into the right eye 4 (four) times daily.   prednisoLONE  acetate (PRED FORTE ) 1 % ophthalmic suspension Place 1 drop into the right eye 4 (four) times daily.   neomycin-polymyxin-dexameth (MAXITROL ) 0.1 % OINT Place 1 Application into the right eye 4 (four) times daily.   prednisoLONE  acetate (PRED FORTE ) 1 % ophthalmic suspension Place 1 drop into the left eye 4 (four)  times daily.   No current facility-administered medications for this visit. (Ophthalmic Drugs)   Current Outpatient Medications (Other)  Medication Sig   acetaminophen  (TYLENOL ) 650 MG CR tablet Take 650 mg by mouth 2 (two) times daily.   Calcium  Citrate-Vitamin D (CALCIUM  CITRATE + D PO) Take 1 tablet by mouth 2 (two) times daily. Calcium  citrate 500 mg Vitamin D3 400 IU   Cholecalciferol (EQL VITAMIN D3) 25 MCG (1000 UT) capsule Take 1,000 Units by mouth daily.   Cyanocobalamin (B-12 PO) Take 1 tablet by mouth 4 (four) times a week.   diphenhydramine-acetaminophen  (TYLENOL  PM) 25-500 MG TABS tablet Take 1 tablet by mouth at bedtime.   docusate sodium  (COLACE) 100 MG capsule Take 100 mg by mouth daily.   gabapentin  (NEURONTIN ) 100 MG capsule Take 300 mg by mouth at bedtime.   methocarbamol  (ROBAXIN ) 500 MG tablet Take 1 tablet (500 mg total) by mouth every 8 (eight) hours as needed for muscle spasms.   Multiple Vitamin (MULTIVITAMIN) tablet Take 1 tablet by mouth daily.   naproxen sodium (ALEVE) 220 MG tablet Take 220 mg by mouth daily.   oxymetazoline (AFRIN) 0.05 % nasal spray Place 1 spray into both nostrils 2 (two) times daily as needed for congestion.   rosuvastatin  (CRESTOR ) 5 MG tablet Take 5 mg by mouth 3 (three) times a week.   Glucosamine-Chondroit-Vit C-Mn (GLUCOSAMINE 1500 COMPLEX PO) Take 1,500 mg by mouth  2 (two) times daily. (Patient not taking: Reported on 09/20/2023)   Meth-Hyo-M Bl-Na Phos-Ph Sal (URIBEL ) 118 MG CAPS Take 1 capsule (118 mg total) by mouth 3 (three) times daily as needed (Urinary frequency, urgency, burning). (Patient not taking: Reported on 09/01/2023)   No current facility-administered medications for this visit. (Other)   REVIEW OF SYSTEMS: ROS   Positive for: Musculoskeletal, Eyes Negative for: Constitutional, Gastrointestinal, Neurological, Skin, Genitourinary, HENT, Endocrine, Cardiovascular, Respiratory, Psychiatric, Allergic/Imm, Heme/Lymph Last  edited by Antonetta Almetta BRAVO, COT on 09/20/2023  8:05 AM.     ALLERGIES Allergies  Allergen Reactions   Food Color Red Anaphylaxis    Red delicious apple skins   Other     Red Delicious Apple skins causes anaphylaxis     Soy Allergy (Obsolete) Other (See Comments)    Headache    Zocor [Simvastatin] Other (See Comments)    Leg pain   PAST MEDICAL HISTORY Past Medical History:  Diagnosis Date   Allergic state    Arthritis    BPH (benign prostatic hyperplasia)    Bursitis    hips   Cancer (HCC)    basal and squamous cell   Cataracts, bilateral    ED (erectile dysfunction)    Elevated PSA    Heart murmur    born with it but states he grew out of it   Hemorrhoids    History of chicken pox    History of colon polyps    History of kidney stones    Hypercholesteremia    Hypogonadism in male    Nephrolithiasis    PIN III (prostatic intraepithelial neoplasia III)    Spinal stenosis of lumbar region    Umbilical hernia    Past Surgical History:  Procedure Laterality Date   ANKLE FRACTURE SURGERY Left 10/10/2007   CARPAL TUNNEL RELEASE Right 2007   CATARACT EXTRACTION, BILATERAL Bilateral    COLONOSCOPY     1993, 1996, 2000, 2005,2010, 2015, 2018   COLONOSCOPY WITH PROPOFOL  N/A 09/21/2016   Procedure: COLONOSCOPY WITH PROPOFOL ;  Surgeon: Viktoria Lamar DASEN, MD;  Location: [facility] ENDOSCOPY;  Service: Endoscopy;  Laterality: N/A;   CYSTOSCOPY W/ RETROGRADES  04/13/2021   Procedure: CYSTOSCOPY WITH RETROGRADE PYELOGRAM;  Surgeon: Twylla Glendia BROCKS, MD;  Location: [facility] ORS;  Service: Urology;;   CYSTOSCOPY WITH INSERTION OF UROLIFT N/A 07/27/2021   Procedure: CYSTOSCOPY WITH INSERTION OF UROLIFT;  Surgeon: Twylla Glendia BROCKS, MD;  Location: [facility] ORS;  Service: Urology;  Laterality: N/A;   CYSTOSCOPY/URETEROSCOPY/HOLMIUM LASER/STENT PLACEMENT Right 04/13/2021   Procedure: CYSTOSCOPY/URETEROSCOPY/HOLMIUM LASER/STENT PLACEMENT;  Surgeon: Twylla Glendia BROCKS, MD;  Location: [facility] ORS;   Service: Urology;  Laterality: Right;   FEMUR FRACTURE SURGERY Left 10/10/2007   FRACTURE SURGERY Left 10/10/2007   ORIF DISTAL RADIUS FRACTURE AND PERTROCHANTERIC FEMUR FRACTURE   HERNIA REPAIR Bilateral 1964   KNEE SURGERY Left 1984   torn meniscus   KNEE SURGERY Right 2005   torn meniscus   LUMBAR LAMINECTOMY/DECOMPRESSION MICRODISCECTOMY N/A 07/02/2021   Procedure: L2-S1 DECOMPRESSION;  Surgeon: Clois Fret, MD;  Location: [facility] ORS;  Service: Neurosurgery;  Laterality: N/A;   VITRECTOMY 25 GAUGE WITH SCLERAL BUCKLE Right 08/31/2023   Procedure: VITRECTOMY, USING 25-GAUGE INSTRUMENTS, WITH ENDOLASER & GAS;  Surgeon: Valdemar Rogue, MD;  Location: [facility] OR;  Service: Ophthalmology;  Laterality: Right;   FAMILY HISTORY History reviewed. No pertinent family history. SOCIAL HISTORY Social History   Tobacco Use   Smoking status: Former    Types: Pipe    Quit  date: 05/15/1978    Years since quitting: 45.4   Smokeless tobacco: Never  Vaping Use   Vaping status: Never Used  Substance Use Topics   Alcohol use: No   Drug use: No       OPHTHALMIC EXAM:  Base Eye Exam     Visual Acuity (Snellen - Linear)       Right Left   Dist cc CF@1  20/25   Dist ph cc NI     Correction: Glasses  Pt can see light and screen but not letters MS        Tonometry (Tonopen, 8:16 AM)       Right Left   Pressure 18 def         Pupils       Dark Light Shape React APD   Right        Left 3 2 Round Slow None         Visual Fields (Counting fingers)       Left Right    Full    Restrictions  Partial outer inferior temporal, inferior nasal deficiencies; Partial inner superior temporal, superior nasal deficiencies         Extraocular Movement       Right Left    Full, Ortho Full, Ortho         Neuro/Psych     Oriented x3: Yes   Mood/Affect: Normal         Dilation     Right eye: 1.0% Mydriacyl , 2.5% Phenylephrine  @ 8:17 AM           Slit Lamp and Fundus  Exam     External Exam       Right Left   External Normal Normal         Slit Lamp Exam       Right Left   Lids/Lashes Dermatochalasis Dermatochalasis, mild MGD   Conjunctiva/Sclera 360 sub conj heme-improving, sutures intact White and quiet   Cornea Central epi defect 87mmVx7.5mmH--closed; 2+ PEE, +endo pigment, 1+ Decemets folds, Well healed cataract wound Well healed temporal cataract wound, tear film debris, trace PEE, nasal and temporal LRI, 2-3+ Guttata   Anterior Chamber Deep and clear Deep, 3+fine cell and pigment   Iris Round and dilated Round and reactive   Lens 3 piece PC IOL in good position w/ open PC 3 piece PC IOL in good position w/ open PC   Anterior Vitreous Post vit, good gas fill% 80 Vitreous syneresis         Fundus Exam       Right Left   Disc Hazy view, Mild pallor, sharp rim    C/D Ratio 0.4    Macula Flat under gas, inferior retina attached    Vessels Attenuated, mild tortuosity    Periphery Hazy view, Periphery attached under gas, inferior retina attached under fluid, good laser changes 360, posterior retinotomy with good laser surrounding nasal to disc; PRE-OP: Bullous superior detachment from [id number], hole at 1200, pigmented CR scarring at 11 o'clock            IMAGING AND PROCEDURES  Imaging and Procedures for 09/20/2023          ASSESSMENT/PLAN:   ICD-10-CM   1. Right retinal detachment  H33.21 CANCELED: OCT, Retina - OU - Both Eyes    2. Epiretinal membrane (ERM) of both eyes  H35.373     3. Essential hypertension  I10     4. Hypertensive retinopathy of both  eyes  H35.033     5. Pseudophakia of both eyes  Z96.1      Rhegmatogenous retinal detachment, OD - Bullous superior detachment w/ extension inferiorly through fovea, onset ~08.01.25 by history - s/p pneumatic cryopexy OD on 08.11.25 - central macula reattached, but residual SRF remained in periphery around gas bubble - s/p PPV/PFO/EL/FAX/14% C3F8 OD, 8.21.25              - doing well - retina attached and in good position -- good peripheral laser 360 and around retinotomies  - central epi defect - BCL placed on 08.28.25 - epi defect closed 09.02.25 - BCL removed             - IOP 18             - cont PF 4x/day OD Zymaxid /ofloxacin -QID OD -- stop Brimonidine  QD OD--continue PSO ung BID OD-restart now that BCL is out. - cont face down positioning 50min/hr; avoid laying flat on back, on Sunday can go down to 64min/hr.              - eye shield when sleeping              - post op drop and positioning instructions reviewed              - tylenol /ibuprofen for pain   - f/u 2 wk - POV / K check  2. Epiretinal membrane, OU - The natural history, anatomy, potential for loss of vision, and treatment options including vitrectomy techniques and the complications of endophthalmitis, retinal detachment, vitreous hemorrhage, cataract progression and permanent vision loss discussed with the patient. - mild ERM OU - no indication for surgery at this time - monitor for now  3,4. Hypertensive retinopathy OU - discussed importance of tight BP control - monitor   5. Pseudophakia OU  - s/p CE/IOL  - IOL in good position, doing well  - monitor   Ophthalmic Meds Ordered this visit:  Meds ordered this encounter  Medications   bacitracin -polymyxin b  (POLYSPORIN ) ophthalmic ointment    Sig: Place into the right eye 2 (two) times daily. Place a 1/2 inch ribbon of ointment into the lower eyelid.    Dispense:  3.5 g    Refill:  0     Return in about 2 weeks (around 10/04/2023) for f/u RD OD, DFE.  There are no Patient Instructions on file for this visit.  Explained the diagnoses, plan, and follow up with the patient and they expressed understanding.  Patient expressed understanding of the importance of proper follow up care.    This document serves as a record of services personally performed by [name], MD, PhD. It was created on their behalf by Almetta Pesa, an ophthalmic technician. The creation of this record is the provider's dictation and/or activities during the visit.    Electronically signed by: Almetta Pesa, OA, 09/28/23  1:04 PM  This document serves as a record of services personally performed by [name], MD, PhD. It was created on their behalf by Wanda GEANNIE Keens, COT an ophthalmic technician. The creation of this record is the provider's dictation and/or activities during the visit.    Electronically signed by:  Wanda GEANNIE Keens, COT  09/28/23 1:04 PM  [name], M.D., Ph.D. Diseases & Surgery of the Retina and Vitreous [facility]  I have reviewed the above documentation for accuracy and completeness, and I agree with  the above. [name], M.D., Ph.D. 09/28/23 1:05 PM    Abbreviations: M myopia (nearsighted); A astigmatism; H hyperopia (farsighted); P presbyopia; Mrx spectacle prescription;  CTL contact lenses; OD right eye; OS left eye; OU both eyes  XT exotropia; ET esotropia; PEK punctate epithelial keratitis; PEE punctate epithelial erosions; DES dry eye syndrome; MGD meibomian gland dysfunction; ATs artificial tears; PFAT's preservative free artificial tears; NSC nuclear sclerotic cataract; PSC posterior subcapsular cataract; ERM epi-retinal membrane; PVD posterior vitreous detachment; RD retinal detachment; DM diabetes mellitus; DR diabetic retinopathy; NPDR non-proliferative diabetic retinopathy; PDR proliferative diabetic retinopathy; CSME clinically significant macular edema; DME diabetic macular edema; dbh dot blot hemorrhages; CWS cotton wool spot; POAG primary open angle glaucoma; C/D cup-to-disc ratio; HVF humphrey visual field; GVF goldmann visual field; OCT optical coherence tomography; IOP intraocular pressure; BRVO Branch retinal vein occlusion; CRVO central retinal vein occlusion; CRAO central retinal artery occlusion; BRAO branch retinal artery occlusion; RT  retinal tear; SB scleral buckle; PPV pars plana vitrectomy; VH Vitreous hemorrhage; PRP panretinal laser photocoagulation; IVK intravitreal kenalog ; VMT vitreomacular traction; MH Macular hole;  NVD neovascularization of the disc; NVE neovascularization elsewhere; AREDS age related eye disease study; ARMD age related macular degeneration; POAG primary open angle glaucoma; EBMD epithelial/anterior basement membrane dystrophy; ACIOL anterior chamber intraocular lens; IOL intraocular lens; PCIOL posterior chamber intraocular lens; Phaco/IOL phacoemulsification with intraocular lens placement; PRK photorefractive keratectomy; LASIK laser assisted in situ keratomileusis; HTN hypertension; DM diabetes mellitus; COPD chronic obstructive pulmonary disease

## 2023-09-28 ENCOUNTER — Encounter (INDEPENDENT_AMBULATORY_CARE_PROVIDER_SITE_OTHER): Payer: Self-pay | Admitting: Ophthalmology

## 2023-10-02 NOTE — Progress Notes (Signed)
 [facility] - Clinic Note  10/05/2023   CHIEF COMPLAINT Patient presents for Retina Follow Up  HISTORY OF PRESENT ILLNESS: Devin Landry is a 83 y.o. male who presents to the clinic today for:  HPI     Retina Follow Up   Patient presents with  Other.  In right eye.  This started 2 weeks ago.  I, the attending physician,  performed the HPI with the patient and updated documentation appropriately.        Comments   Patient here for 2 weeks retina follow up for RD OD. Patient states vision is improving in some stages. Can see the letter on screen. The bubble edge is below middle. Top part has light gray color. No eye pain. Using drops and ung.      Last edited by Valdemar Rogue, MD on 10/10/2023  2:18 AM.    Patient states VA is improving, did use ointment this am and may have obscured his clarity.   Referring physician: Karolynn Drivers, OD [facility], [place]  HISTORICAL INFORMATION:  Selected notes from the MEDICAL RECORD NUMBER Referred by Dr. Drivers Karolynn, OD for retinoschisis vs RD OD LEE:  Ocular Hx- pseudophakia OU (Epes) PMH-   CURRENT MEDICATIONS: Current Outpatient Medications (Ophthalmic Drugs)  Medication Sig   bacitracin -polymyxin b  (POLYSPORIN ) ophthalmic ointment Place into the right eye 2 (two) times daily. Place a 1/2 inch ribbon of ointment into the lower eyelid.   ofloxacin  (OCUFLOX ) 0.3 % ophthalmic solution Place 1 drop into the right eye 4 (four) times daily.   prednisoLONE  acetate (PRED FORTE ) 1 % ophthalmic suspension Place 1 drop into the right eye 4 (four) times daily.   neomycin-polymyxin-dexameth (MAXITROL ) 0.1 % OINT Place 1 Application into the right eye 4 (four) times daily.   prednisoLONE  acetate (PRED FORTE ) 1 % ophthalmic suspension Place 1 drop into the left eye 4 (four) times daily.   No current facility-administered medications for this visit. (Ophthalmic Drugs)   Current Outpatient Medications (Other)   Medication Sig   acetaminophen  (TYLENOL ) 650 MG CR tablet Take 650 mg by mouth 2 (two) times daily.   Calcium  Citrate-Vitamin D (CALCIUM  CITRATE + D PO) Take 1 tablet by mouth 2 (two) times daily. Calcium  citrate 500 mg Vitamin D3 400 IU   Cholecalciferol (EQL VITAMIN D3) 25 MCG (1000 UT) capsule Take 1,000 Units by mouth daily.   Cyanocobalamin (B-12 PO) Take 1 tablet by mouth 4 (four) times a week.   diphenhydramine-acetaminophen  (TYLENOL  PM) 25-500 MG TABS tablet Take 1 tablet by mouth at bedtime.   docusate sodium  (COLACE) 100 MG capsule Take 100 mg by mouth daily.   gabapentin  (NEURONTIN ) 100 MG capsule Take 300 mg by mouth at bedtime.   methocarbamol  (ROBAXIN ) 500 MG tablet Take 1 tablet (500 mg total) by mouth every 8 (eight) hours as needed for muscle spasms.   Multiple Vitamin (MULTIVITAMIN) tablet Take 1 tablet by mouth daily.   rosuvastatin  (CRESTOR ) 5 MG tablet Take 5 mg by mouth 3 (three) times a week.   Glucosamine-Chondroit-Vit C-Mn (GLUCOSAMINE 1500 COMPLEX PO) Take 1,500 mg by mouth 2 (two) times daily. (Patient not taking: Reported on 10/05/2023)   Meth-Hyo-M Bl-Na Phos-Ph Sal (URIBEL ) 118 MG CAPS Take 1 capsule (118 mg total) by mouth 3 (three) times daily as needed (Urinary frequency, urgency, burning). (Patient not taking: Reported on 09/01/2023)   naproxen sodium (ALEVE) 220 MG tablet Take 220 mg by mouth daily. (Patient not taking: Reported  on 10/05/2023)   oxymetazoline (AFRIN) 0.05 % nasal spray Place 1 spray into both nostrils 2 (two) times daily as needed for congestion. (Patient not taking: Reported on 10/05/2023)   No current facility-administered medications for this visit. (Other)   REVIEW OF SYSTEMS: ROS   Positive for: Musculoskeletal, Eyes Negative for: Constitutional, Gastrointestinal, Neurological, Skin, Genitourinary, HENT, Endocrine, Cardiovascular, Respiratory, Psychiatric, Allergic/Imm, Heme/Lymph Last edited by Orval Asberry RAMAN, COA on 10/05/2023  8:16  AM.      ALLERGIES Allergies  Allergen Reactions   Food Color Red Anaphylaxis    Red delicious apple skins   Other     Red Delicious Apple skins causes anaphylaxis     Soy Allergy (Obsolete) Other (See Comments)    Headache    Zocor [Simvastatin] Other (See Comments)    Leg pain   PAST MEDICAL HISTORY Past Medical History:  Diagnosis Date   Allergic state    Arthritis    BPH (benign prostatic hyperplasia)    Bursitis    hips   Cancer (HCC)    basal and squamous cell   Cataracts, bilateral    ED (erectile dysfunction)    Elevated PSA    Heart murmur    born with it but states he grew out of it   Hemorrhoids    History of chicken pox    History of colon polyps    History of kidney stones    Hypercholesteremia    Hypogonadism in male    Nephrolithiasis    PIN III (prostatic intraepithelial neoplasia III)    Spinal stenosis of lumbar region    Umbilical hernia    Past Surgical History:  Procedure Laterality Date   ANKLE FRACTURE SURGERY Left 10/10/2007   CARPAL TUNNEL RELEASE Right 2007   CATARACT EXTRACTION, BILATERAL Bilateral    COLONOSCOPY     1993, 1996, 2000, 2005,2010, 2015, 2018   COLONOSCOPY WITH PROPOFOL  N/A 09/21/2016   Procedure: COLONOSCOPY WITH PROPOFOL ;  Surgeon: Viktoria Lamar DASEN, MD;  Location: [facility] ENDOSCOPY;  Service: Endoscopy;  Laterality: N/A;   CYSTOSCOPY W/ RETROGRADES  04/13/2021   Procedure: CYSTOSCOPY WITH RETROGRADE PYELOGRAM;  Surgeon: Twylla Glendia BROCKS, MD;  Location: [facility] ORS;  Service: Urology;;   CYSTOSCOPY WITH INSERTION OF UROLIFT N/A 07/27/2021   Procedure: CYSTOSCOPY WITH INSERTION OF UROLIFT;  Surgeon: Twylla Glendia BROCKS, MD;  Location: [facility] ORS;  Service: Urology;  Laterality: N/A;   CYSTOSCOPY/URETEROSCOPY/HOLMIUM LASER/STENT PLACEMENT Right 04/13/2021   Procedure: CYSTOSCOPY/URETEROSCOPY/HOLMIUM LASER/STENT PLACEMENT;  Surgeon: Twylla Glendia BROCKS, MD;  Location: [facility] ORS;  Service: Urology;  Laterality: Right;   FEMUR FRACTURE  SURGERY Left 10/10/2007   FRACTURE SURGERY Left 10/10/2007   ORIF DISTAL RADIUS FRACTURE AND PERTROCHANTERIC FEMUR FRACTURE   HERNIA REPAIR Bilateral 1964   KNEE SURGERY Left 1984   torn meniscus   KNEE SURGERY Right 2005   torn meniscus   LUMBAR LAMINECTOMY/DECOMPRESSION MICRODISCECTOMY N/A 07/02/2021   Procedure: L2-S1 DECOMPRESSION;  Surgeon: Clois Fret, MD;  Location: [facility] ORS;  Service: Neurosurgery;  Laterality: N/A;   VITRECTOMY 25 GAUGE WITH SCLERAL BUCKLE Right 08/31/2023   Procedure: VITRECTOMY, USING 25-GAUGE INSTRUMENTS, WITH ENDOLASER & GAS;  Surgeon: Valdemar Rogue, MD;  Location[facility] OR;  Service: Ophthalmology;  Laterality: Right;   FAMILY HISTORY History reviewed. No pertinent family history. SOCIAL HISTORY Social History   Tobacco Use   Smoking status: Former    Types: Pipe    Quit date: 05/15/1978    Years since quitting: 45.4   Smokeless tobacco:  Never  Vaping Use   Vaping status: Never Used  Substance Use Topics   Alcohol use: No   Drug use: No       OPHTHALMIC EXAM:  Base Eye Exam     Visual Acuity (Snellen - Linear)       Right Left   Dist cc 20/250 -1 20/25 -2    Correction: Glasses         Tonometry (Tonopen, 8:12 AM)       Right Left   Pressure 16 17         Pupils       Dark Light Shape React APD   Right 5 5 Round NR None   Left 3 2 Round Brisk None         Visual Fields (Counting fingers)       Left Right    Full    Restrictions  Partial inner inferior temporal, inferior nasal deficiencies         Extraocular Movement       Right Left    Full, Ortho Full, Ortho         Neuro/Psych     Oriented x3: Yes   Mood/Affect: Normal         Dilation     Both eyes: 1.0% Mydriacyl , 2.5% Phenylephrine  @ 8:12 AM           Slit Lamp and Fundus Exam     External Exam       Right Left   External Normal Normal         Slit Lamp Exam       Right Left   Lids/Lashes Dermatochalasis Dermatochalasis,  mild MGD   Conjunctiva/Sclera sutures intact and dissolving White and quiet   Cornea Central epi defect--stably closed; 2-3+ PEE, tear film debris, +pigmented guttata, Well healed cataract wound Well healed temporal cataract wound, tear film debris, trace PEE, nasal and temporal LRI, 2-3+ Guttata   Anterior Chamber Deep, 0.5+ fine cell and pigment Deep, 3+fine cell and pigment   Iris Round and dilated Round and reactive   Lens 3 piece PC IOL in good position w/ open PC 3 piece PC IOL in good position w/ open PC   Anterior Vitreous Post vitrectomy, good gas fill 50% Vitreous syneresis         Fundus Exam       Right Left   Disc Hazy view, Mild pallor, sharp rim Pink and Sharp, Compact   C/D Ratio 0.4 0.3   Macula Flat under gas    Vessels Attenuated, mild tortuosity    Periphery Hazy view superiorly, Periphery attached under gas, inferior retina attached under fluid, good laser changes 360, posterior retinotomy with good laser surrounding nasal to disc, mild heme; PRE-OP: Bullous superior detachment from [phone], hole at 1200, pigmented CR scarring at 11 o'clock            Refraction     Wearing Rx       Sphere Cylinder Axis Add   Right -1.25 +1.50 179 +2.50   Left -0.50 +1.00 161 +2.50    Type: Progressive           IMAGING AND PROCEDURES  Imaging and Procedures for 10/05/2023  OCT, Retina - OU - Both Eyes       Right Eye Quality was poor. Central Foveal Thickness: 431. Progression has improved. Findings include normal foveal contour, no IRF, no SRF, epiretinal membrane (Stable improvement in central SRF-- fovea reattached,  superior macula obscured by gas bubble, mild ERM, mild ellipsoid thinning).   Left Eye Quality was good. Central Foveal Thickness: 322. Progression has been stable. Findings include normal foveal contour, no IRF, no SRF, epiretinal membrane (Mild ERM w/ blunting of foveal contour. ).   Notes *Images captured and stored on drive  Diagnosis /  Impression:  OD: Stable improvement in central SRF-- fovea reattached, superior macula obscured by gas bubble, mild ERM, mild ellipsoid thinning OS: Mild ERM w/ blunting of foveal contour.   Clinical management:  See below  Abbreviations: NFP - Normal foveal profile. CME - cystoid macular edema. PED - pigment epithelial detachment. IRF - intraretinal fluid. SRF - subretinal fluid. EZ - ellipsoid zone. ERM - epiretinal membrane. ORA - outer retinal atrophy. ORT - outer retinal tubulation. SRHM - subretinal hyper-reflective material. IRHM - intraretinal hyper-reflective material             ASSESSMENT/PLAN:   ICD-10-CM   1. Right retinal detachment  H33.21 OCT, Retina - OU - Both Eyes    2. Epiretinal membrane (ERM) of both eyes  H35.373     3. Essential hypertension  I10     4. Hypertensive retinopathy of both eyes  H35.033     5. Pseudophakia of both eyes  Z96.1       Rhegmatogenous retinal detachment, OD - Bullous superior detachment w/ extension inferiorly through fovea, onset ~08.01.25 by history - s/p pneumatic cryopexy OD on 08.11.25 - central macula reattached, but residual SRF remained in periphery around gas bubble - s/p PPV/PFO/EL/FAX/14% C3F8 OD, 8.21.25             - doing well - retina attached and in good position -- good peripheral laser 360 and around retinotomies - OCT OD today shows stable improvement in central SRF-- fovea reattached, superior macula obscured by gas bubble, mild ERM, mild ellipsoid thinning  - central epi defect - BCL placed on 08.28.25 - epi defect closed 09.02.25 - BCL removed             - IOP 16             - cont PF 4x/day OD--continue Brimonidine  QD OD--continue PSO ung BID OD-can decrease to at bedtime Use Ats 3-4x/day OU - cont keeping head upright and level, avoid supine positioning or looking upwards. Can sleep on side or belly, avoid on back.              - post op drop and positioning instructions reviewed              -  tylenol /ibuprofen for pain   - f/u 3-4wks - POV  2. Epiretinal membrane, OU - The natural history, anatomy, potential for loss of vision, and treatment options including vitrectomy techniques and the complications of endophthalmitis, retinal detachment, vitreous hemorrhage, cataract progression and permanent vision loss discussed with the patient. - mild ERM OU - no indication for surgery at this time - monitor for now  3,4. Hypertensive retinopathy OU - discussed importance of tight BP control - monitor  5. Pseudophakia OU  - s/p CE/IOL  - IOL in good position, doing well  - monitor  Ophthalmic Meds Ordered this visit:  No orders of the defined types were placed in this encounter.    Return for 3-4wks POV RD OD, DFE, OCT OU.  There are no Patient Instructions on file for this visit.  Explained the diagnoses, plan, and follow up with the patient and they  expressed understanding.  Patient expressed understanding of the importance of proper follow up care.    This document serves as a record of services personally performed by [name], MD, PhD. It was created on their behalf by Auston Muzzy, COMT. The creation of this record is the provider's dictation and/or activities during the visit.  Electronically signed by: Auston Muzzy, COMT 10/10/23 2:21 AM  This document serves as a record of services personally performed by [name], MD, PhD. It was created on their behalf by Almetta Pesa, an ophthalmic technician. The creation of this record is the provider's dictation and/or activities during the visit.    Electronically signed by: Almetta Pesa, OA, 10/10/23  2:21 AM   [name], M.D., Ph.D. Diseases & Surgery of the Retina and Vitreous [facility]  I have reviewed the above documentation for accuracy and completeness, and I agree with the above. [name], M.D., Ph.D. 10/10/23 2:22 AM   Abbreviations: M myopia  (nearsighted); A astigmatism; H hyperopia (farsighted); P presbyopia; Mrx spectacle prescription;  CTL contact lenses; OD right eye; OS left eye; OU both eyes  XT exotropia; ET esotropia; PEK punctate epithelial keratitis; PEE punctate epithelial erosions; DES dry eye syndrome; MGD meibomian gland dysfunction; ATs artificial tears; PFAT's preservative free artificial tears; NSC nuclear sclerotic cataract; PSC posterior subcapsular cataract; ERM epi-retinal membrane; PVD posterior vitreous detachment; RD retinal detachment; DM diabetes mellitus; DR diabetic retinopathy; NPDR non-proliferative diabetic retinopathy; PDR proliferative diabetic retinopathy; CSME clinically significant macular edema; DME diabetic macular edema; dbh dot blot hemorrhages; CWS cotton wool spot; POAG primary open angle glaucoma; C/D cup-to-disc ratio; HVF humphrey visual field; GVF goldmann visual field; OCT optical coherence tomography; IOP intraocular pressure; BRVO Branch retinal vein occlusion; CRVO central retinal vein occlusion; CRAO central retinal artery occlusion; BRAO branch retinal artery occlusion; RT retinal tear; SB scleral buckle; PPV pars plana vitrectomy; VH Vitreous hemorrhage; PRP panretinal laser photocoagulation; IVK intravitreal kenalog ; VMT vitreomacular traction; MH Macular hole;  NVD neovascularization of the disc; NVE neovascularization elsewhere; AREDS age related eye disease study; ARMD age related macular degeneration; POAG primary open angle glaucoma; EBMD epithelial/anterior basement membrane dystrophy; ACIOL anterior chamber intraocular lens; IOL intraocular lens; PCIOL posterior chamber intraocular lens; Phaco/IOL phacoemulsification with intraocular lens placement; PRK photorefractive keratectomy; LASIK laser assisted in situ keratomileusis; HTN hypertension; DM diabetes mellitus; COPD chronic obstructive pulmonary disease

## 2023-10-05 ENCOUNTER — Ambulatory Visit (INDEPENDENT_AMBULATORY_CARE_PROVIDER_SITE_OTHER): Admitting: Ophthalmology

## 2023-10-05 ENCOUNTER — Encounter (INDEPENDENT_AMBULATORY_CARE_PROVIDER_SITE_OTHER): Payer: Self-pay | Admitting: Ophthalmology

## 2023-10-05 DIAGNOSIS — H35033 Hypertensive retinopathy, bilateral: Secondary | ICD-10-CM | POA: Diagnosis not present

## 2023-10-05 DIAGNOSIS — H35373 Puckering of macula, bilateral: Secondary | ICD-10-CM | POA: Diagnosis not present

## 2023-10-05 DIAGNOSIS — H3321 Serous retinal detachment, right eye: Secondary | ICD-10-CM

## 2023-10-05 DIAGNOSIS — I10 Essential (primary) hypertension: Secondary | ICD-10-CM | POA: Diagnosis not present

## 2023-10-05 DIAGNOSIS — Z961 Presence of intraocular lens: Secondary | ICD-10-CM

## 2023-10-10 ENCOUNTER — Encounter (INDEPENDENT_AMBULATORY_CARE_PROVIDER_SITE_OTHER): Payer: Self-pay | Admitting: Ophthalmology

## 2023-10-24 NOTE — Progress Notes (Signed)
 [facility] - Clinic Note  10/27/2023   CHIEF COMPLAINT Patient presents for Retina Follow Up  HISTORY OF PRESENT ILLNESS: Devin Landry is a 83 y.o. male who presents to the clinic today for:  HPI     Retina Follow Up   In right eye.  This started 3 weeks ago.  Duration of 3 weeks.  Since onset it is stable.  I, the attending physician,  performed the HPI with the patient and updated documentation appropriately.        Comments   3 week retina follow up RD OD pt is reporting vision seems to be coming in a little still seeing some of the gas bubble pt is using PF 4x/day OD- Brimonidine  QD OD-PSO ung BID OD  Ats 3-4x/day OU        Last edited by Valdemar Rogue, MD on 11/04/2023  2:42 PM.     Patient states bubble is not totally gone. VA has improved. Still feels like he's looking through plastic.    Referring physician: Karolynn Drivers, OD [facility], [place]  HISTORICAL INFORMATION:  Selected notes from the MEDICAL RECORD NUMBER Referred by Dr. Drivers Karolynn, OD for retinoschisis vs RD OD LEE:  Ocular Hx- pseudophakia OU (Epes) PMH-   CURRENT MEDICATIONS: Current Outpatient Medications (Ophthalmic Drugs)  Medication Sig   bacitracin -polymyxin b  (POLYSPORIN ) ophthalmic ointment Place into the right eye 2 (two) times daily. Place a 1/2 inch ribbon of ointment into the lower eyelid.   ofloxacin  (OCUFLOX ) 0.3 % ophthalmic solution Place 1 drop into the right eye 4 (four) times daily.   prednisoLONE  acetate (PRED FORTE ) 1 % ophthalmic suspension Place 1 drop into the right eye 4 (four) times daily.   neomycin-polymyxin-dexameth (MAXITROL ) 0.1 % OINT Place 1 Application into the right eye 4 (four) times daily.   prednisoLONE  acetate (PRED FORTE ) 1 % ophthalmic suspension Place 1 drop into the left eye 4 (four) times daily.   No current facility-administered medications for this visit. (Ophthalmic Drugs)   Current Outpatient Medications (Other)   Medication Sig   acetaminophen  (TYLENOL ) 650 MG CR tablet Take 650 mg by mouth 2 (two) times daily.   Calcium  Citrate-Vitamin D (CALCIUM  CITRATE + D PO) Take 1 tablet by mouth 2 (two) times daily. Calcium  citrate 500 mg Vitamin D3 400 IU   Cholecalciferol (EQL VITAMIN D3) 25 MCG (1000 UT) capsule Take 1,000 Units by mouth daily.   Cyanocobalamin (B-12 PO) Take 1 tablet by mouth 4 (four) times a week.   diphenhydramine-acetaminophen  (TYLENOL  PM) 25-500 MG TABS tablet Take 1 tablet by mouth at bedtime.   docusate sodium  (COLACE) 100 MG capsule Take 100 mg by mouth daily.   gabapentin  (NEURONTIN ) 100 MG capsule Take 300 mg by mouth at bedtime.   Glucosamine-Chondroit-Vit C-Mn (GLUCOSAMINE 1500 COMPLEX PO) Take 1,500 mg by mouth 2 (two) times daily.   methocarbamol  (ROBAXIN ) 500 MG tablet Take 1 tablet (500 mg total) by mouth every 8 (eight) hours as needed for muscle spasms.   Multiple Vitamin (MULTIVITAMIN) tablet Take 1 tablet by mouth daily.   naproxen sodium (ALEVE) 220 MG tablet Take 220 mg by mouth daily.   oxymetazoline (AFRIN) 0.05 % nasal spray Place 1 spray into both nostrils 2 (two) times daily as needed for congestion.   rosuvastatin  (CRESTOR ) 5 MG tablet Take 5 mg by mouth 3 (three) times a week.   Meth-Hyo-M Bl-Na Phos-Ph Sal (URIBEL ) 118 MG CAPS Take 1 capsule (118 mg  total) by mouth 3 (three) times daily as needed (Urinary frequency, urgency, burning). (Patient not taking: Reported on 09/01/2023)   No current facility-administered medications for this visit. (Other)   REVIEW OF SYSTEMS: ROS   Positive for: Musculoskeletal, Eyes Negative for: Constitutional, Gastrointestinal, Neurological, Skin, Genitourinary, HENT, Endocrine, Cardiovascular, Respiratory, Psychiatric, Allergic/Imm, Heme/Lymph Last edited by Resa Delon ORN, COT on 10/27/2023  7:57 AM.       ALLERGIES Allergies  Allergen Reactions   Food Color Red Anaphylaxis    Red delicious apple skins   Other      Red Delicious Apple skins causes anaphylaxis     Soy Allergy (Obsolete) Other (See Comments)    Headache    Zocor [Simvastatin] Other (See Comments)    Leg pain   PAST MEDICAL HISTORY Past Medical History:  Diagnosis Date   Allergic state    Arthritis    BPH (benign prostatic hyperplasia)    Bursitis    hips   Cancer (HCC)    basal and squamous cell   Cataracts, bilateral    ED (erectile dysfunction)    Elevated PSA    Heart murmur    born with it but states he grew out of it   Hemorrhoids    History of chicken pox    History of colon polyps    History of kidney stones    Hypercholesteremia    Hypogonadism in male    Nephrolithiasis    PIN III (prostatic intraepithelial neoplasia III)    Spinal stenosis of lumbar region    Umbilical hernia    Past Surgical History:  Procedure Laterality Date   ANKLE FRACTURE SURGERY Left 10/10/2007   CARPAL TUNNEL RELEASE Right 2007   CATARACT EXTRACTION, BILATERAL Bilateral    COLONOSCOPY     1993, 1996, 2000, 2005,2010, 2015, 2018   COLONOSCOPY WITH PROPOFOL  N/A 09/21/2016   Procedure: COLONOSCOPY WITH PROPOFOL ;  Surgeon: Viktoria Lamar DASEN, MD;  Location: [facility];  Service: Endoscopy;  Laterality: N/A;   CYSTOSCOPY W/ RETROGRADES  04/13/2021   Procedure: CYSTOSCOPY WITH RETROGRADE PYELOGRAM;  Surgeon: Twylla Glendia BROCKS, MD;  Location: [facility] ORS;  Service: Urology;;   CYSTOSCOPY WITH INSERTION OF UROLIFT N/A 07/27/2021   Procedure: CYSTOSCOPY WITH INSERTION OF UROLIFT;  Surgeon: Twylla Glendia BROCKS, MD;  Location: [facility] OR[facility];  Service: Urology;  Laterality: N/A;   CYSTOSCOPY/URETEROSCOPY/HOLMIUM LASER/STENT PLACEMENT Right 04/13/2021   Procedure: CYSTOSCOPY/URETEROSCOPY/HOLMIUM LASER/STENT PLACEMENT;  Surgeon: Twylla Glendia BROCKS, MD;  Location: [facility] OR[facility];  Service: Urology;  Laterality: Right;   FEMUR FRACTURE SURGERY Left 10/10/2007   FRACTURE SURGERY Left 10/10/2007   ORIF DISTAL RADIUS FRACTURE AND PERTROCHANTERIC FEMUR FRACTURE    HERNIA REPAIR Bilateral 1964   KNEE SURGERY Left 1984   torn meniscus   KNEE SURGERY Right 2005   torn meniscus   LUMBAR LAMINECTOMY/DECOMPRESSION MICRODISCECTOMY N/A 07/02/2021   Procedure: L2-S1 DECOMPRESSION;  Surgeon: Clois Fret, MD;  Location: [facility] ORS;  Service: Neurosurgery;  Laterality: N/A;   VITRECTOMY 25 GAUGE WITH SCLERAL BUCKLE Right 08/31/2023   Procedure: VITRECTOMY, USING 25-GAUGE INSTRUMENTS, WITH ENDOLASER & GAS;  Surgeon: Valdemar Rogue, MD;  [facility];  Service: Ophthalmology;  Laterality: Right;   FAMILY HISTORY History reviewed. No pertinent family history. SOCIAL HISTORY Social History   Tobacco Use   Smoking status: Former    Types: Pipe    Quit date: 05/15/1978    Years since quitting: 45.5   Smokeless tobacco: Never  Vaping Use   Vaping status: Never Used  Substance Use Topics   Alcohol use: No   Drug use: No       OPHTHALMIC EXAM:  Base Eye Exam     Visual Acuity (Snellen - Linear)       Right Left   Dist cc 20/70 -2 20/25   Dist ph cc NI NI         Tonometry (Tonopen, 8:06 AM)       Right Left   Pressure 13 14         Pupils       Pupils Dark Light Shape React APD   Right PERRL 5 4 Round NR    Left PERRL 3 2 Round Brisk None         Visual Fields       Left Right    Full    Restrictions  Partial inner inferior temporal, inferior nasal deficiencies         Neuro/Psych     Oriented x3: Yes   Mood/Affect: Normal         Dilation     Right eye: 2.5% Phenylephrine  @ 8:06 AM           Slit Lamp and Fundus Exam     External Exam       Right Left   External Normal Normal         Slit Lamp Exam       Right Left   Lids/Lashes Dermatochalasis Dermatochalasis, mild MGD   Conjunctiva/Sclera sutures dissolved and sub conj heme improved White and quiet   Cornea Central epi defect--stably closed; 2+ PEE, tear film debris, 3+ pigmented guttata, Well healed cataract wound Well healed temporal  cataract wound, tear film debris, trace PEE, nasal and temporal LRI, 2-3+ Guttata   Anterior Chamber Deep, 0.5+ fine cell and pigment Deep, 3+fine cell and pigment   Iris Round and dilated Round and reactive   Lens 3 piece PC IOL in good position w/ open PC 3 piece PC IOL in good position w/ open PC   Anterior Vitreous Post vitrectomy, gas bubble at 25% Vitreous syneresis         Fundus Exam       Right Left   Disc Hazy view, Mild pallor, sharp rim Pink and Sharp, Compact   C/D Ratio 0.4 0.3   Macula Flat, Blunted foveal reflex, RPE mottling    Vessels Attenuated, mild tortuosity    Periphery Hazy view superiorly, inferior retina reattached, good laser changes 360, posterior retinotomy with good laser surrounding nasal to disc, mild heme--improving; PRE-OP: Bullous superior detachment from [phone], hole at 1200, pigmented CR scarring at 11 o'clock            Refraction     Wearing Rx       Sphere Cylinder Axis Add   Right -1.25 +1.50 179 +2.50   Left -0.50 +1.00 161 +2.50    Type: Progressive           IMAGING AND PROCEDURES  Imaging and Procedures for 10/27/2023  OCT, Retina - OU - Both Eyes       Right Eye Quality was poor. Central Foveal Thickness: 275. Progression has been stable. Findings include normal foveal contour, no IRF, no SRF, epiretinal membrane (Stable improvement in central SRF-- fovea reattached,, mild ERM, mild ellipsoid thinning).   Left Eye Quality was good. Central Foveal Thickness: 318. Progression has been stable. Findings include normal foveal contour, no IRF, no SRF, epiretinal membrane (Mild ERM w/  blunting of foveal contour. ).   Notes *Images captured and stored on drive  Diagnosis / Impression:  OD: Stable improvement in central SRF-- fovea reattached, mild ERM, mild ellipsoid thinning OS: Mild ERM w/ blunting of foveal contour.   Clinical management:  See below  Abbreviations: NFP - Normal foveal profile. CME - cystoid macular  edema. PED - pigment epithelial detachment. IRF - intraretinal fluid. SRF - subretinal fluid. EZ - ellipsoid zone. ERM - epiretinal membrane. ORA - outer retinal atrophy. ORT - outer retinal tubulation. SRHM - subretinal hyper-reflective material. IRHM - intraretinal hyper-reflective material              ASSESSMENT/PLAN:   ICD-10-CM   1. Right retinal detachment  H33.21 OCT, Retina - OU - Both Eyes    2. Epiretinal membrane (ERM) of both eyes  H35.373     3. Essential hypertension  I10     4. Hypertensive retinopathy of both eyes  H35.033     5. Pseudophakia of both eyes  Z96.1      Rhegmatogenous retinal detachment, OD - Bullous superior detachment w/ extension inferiorly through fovea, onset ~08.01.25 by history - s/p pneumatic cryopexy OD on 08.11.25 - central macula reattached, but residual SRF remained in periphery around gas bubble - s/p PPV/PFO/EL/FAX/14% C3F8 OD, 8.21.25             - doing well - retina attached and in good position -- good peripheral laser 360 and around retinotomies - OCT OD today shows Stable improvement in central SRF-- fovea reattached, mild ERM, mild ellipsoid thinning  - central epi defect closed - BCL placed on 08.28.25 - removed 09.02.25             - IOP 13  - stop Brimonidine    PSO ung at bedtime--ok to stop and repleace w/ OTC lubricating ointment or gel             - cont PF 4x/day OD--decrease to TID Ats 4x/day OU - gas bubble @ 25% - cont keeping head upright and level, avoid supine positioning or looking upwards. Can sleep on side or belly, avoid on back.              - post op drop sheet updated and positioning instructions reviewed   - f/u 3-4wks - POV  2. Epiretinal membrane, OU - The natural history, anatomy, potential for loss of vision, and treatment options including vitrectomy techniques and the complications of endophthalmitis, retinal detachment, vitreous hemorrhage, cataract progression and permanent vision loss discussed  with the patient. - mild ERM OU - no indication for surgery at this time - monitor for now  3,4. Hypertensive retinopathy OU - discussed importance of tight BP control - monitor  5. Pseudophakia OU  - s/p CE/IOL  - IOL in good position, doing well  - monitor  Ophthalmic Meds Ordered this visit:  No orders of the defined types were placed in this encounter.    Return for 3-4wks POV RD OD, DFE, OCT.  There are no Patient Instructions on file for this visit.  This document serves as a record of services personally performed by [name], MD, PhD. It was created on their behalf by Delon Newness COT, an ophthalmic technician. The creation of this record is the provider's dictation and/or activities during the visit.    Electronically signed by: Delon Newness COT 10.17.25  2:54 PM  This document serves as a record of services personally performed by Redell  JUDITHANN Hans, MD, PhD. It was created on their behalf by Almetta Pesa, an ophthalmic technician. The creation of this record is the provider's dictation and/or activities during the visit.    Electronically signed by: Almetta Pesa, OA, 11/04/23  2:54 PM  [name], M.D., Ph.D. Diseases & Surgery of the Retina and Vitreous [facility] 10/27/2023   I have reviewed the above documentation for accuracy and completeness, and I agree with the above. [name], M.D., Ph.D. 11/04/23 2:56 PM   Abbreviations: M myopia (nearsighted); A astigmatism; H hyperopia (farsighted); P presbyopia; Mrx spectacle prescription;  CTL contact lenses; OD right eye; OS left eye; OU both eyes  XT exotropia; ET esotropia; PEK punctate epithelial keratitis; PEE punctate epithelial erosions; DES dry eye syndrome; MGD meibomian gland dysfunction; ATs artificial tears; PFAT's preservative free artificial tears; NSC nuclear sclerotic cataract; PSC posterior subcapsular cataract; ERM epi-retinal membrane; PVD  posterior vitreous detachment; RD retinal detachment; DM diabetes mellitus; DR diabetic retinopathy; NPDR non-proliferative diabetic retinopathy; PDR proliferative diabetic retinopathy; CSME clinically significant macular edema; DME diabetic macular edema; dbh dot blot hemorrhages; CWS cotton wool spot; POAG primary open angle glaucoma; C/D cup-to-disc ratio; HVF humphrey visual field; GVF goldmann visual field; OCT optical coherence tomography; IOP intraocular pressure; BRVO Branch retinal vein occlusion; CRVO central retinal vein occlusion; CRAO central retinal artery occlusion; BRAO branch retinal artery occlusion; RT retinal tear; SB scleral buckle; PPV pars plana vitrectomy; VH Vitreous hemorrhage; PRP panretinal laser photocoagulation; IVK intravitreal kenalog ; VMT vitreomacular traction; MH Macular hole;  NVD neovascularization of the disc; NVE neovascularization elsewhere; AREDS age related eye disease study; ARMD age related macular degeneration; POAG primary open angle glaucoma; EBMD epithelial/anterior basement membrane dystrophy; ACIOL anterior chamber intraocular lens; IOL intraocular lens; PCIOL posterior chamber intraocular lens; Phaco/IOL phacoemulsification with intraocular lens placement; PRK photorefractive keratectomy; LASIK laser assisted in situ keratomileusis; HTN hypertension; DM diabetes mellitus; COPD chronic obstructive pulmonary disease

## 2023-10-27 ENCOUNTER — Encounter (INDEPENDENT_AMBULATORY_CARE_PROVIDER_SITE_OTHER): Payer: Self-pay | Admitting: Ophthalmology

## 2023-10-27 ENCOUNTER — Ambulatory Visit (INDEPENDENT_AMBULATORY_CARE_PROVIDER_SITE_OTHER): Admitting: Ophthalmology

## 2023-10-27 DIAGNOSIS — H35373 Puckering of macula, bilateral: Secondary | ICD-10-CM

## 2023-10-27 DIAGNOSIS — H3321 Serous retinal detachment, right eye: Secondary | ICD-10-CM

## 2023-10-27 DIAGNOSIS — I10 Essential (primary) hypertension: Secondary | ICD-10-CM | POA: Diagnosis not present

## 2023-10-27 DIAGNOSIS — H35033 Hypertensive retinopathy, bilateral: Secondary | ICD-10-CM | POA: Diagnosis not present

## 2023-10-27 DIAGNOSIS — Z961 Presence of intraocular lens: Secondary | ICD-10-CM

## 2023-11-04 ENCOUNTER — Encounter (INDEPENDENT_AMBULATORY_CARE_PROVIDER_SITE_OTHER): Payer: Self-pay | Admitting: Ophthalmology

## 2023-11-20 NOTE — Progress Notes (Signed)
 [facility] - Clinic Note  11/24/2023   CHIEF COMPLAINT Patient presents for Retina Follow Up  HISTORY OF PRESENT ILLNESS: Devin Landry is a 83 y.o. male who presents to the clinic today for:  HPI     Retina Follow Up   Patient presents with  Retinal Break/Detachment.  In right eye.  Severity is moderate.  Duration of 4 weeks.  Since onset it is gradually improving.  I, the attending physician,  performed the HPI with the patient and updated documentation appropriately.        Comments   Pt here for 4 wk f/u POV s/p PPV on 08.21.25. Pt states as of Sunday AM gas bubble is no longer visible. Pt reports using Pred Forte  TID OS, lubricating gtt once daily and gel drop at bedtime.       Last edited by Valdemar Rogue, MD on 12/02/2023  1:46 AM.     Patient states the vision is improving the bubble is gone but still seems like he is looking through bubbles.  Referring physician: Karolynn Drivers, OD [facility] [place]  HISTORICAL INFORMATION:  Selected notes from the MEDICAL RECORD NUMBER Referred by Dr. Drivers Karolynn, OD for retinoschisis vs RD OD LEE:  Ocular Hx- pseudophakia OU (Epes) PMH-   CURRENT MEDICATIONS: Current Outpatient Medications (Ophthalmic Drugs)  Medication Sig   bacitracin -polymyxin b  (POLYSPORIN ) ophthalmic ointment Place into the right eye 2 (two) times daily. Place a 1/2 inch ribbon of ointment into the lower eyelid.   ofloxacin  (OCUFLOX ) 0.3 % ophthalmic solution Place 1 drop into the right eye 4 (four) times daily.   prednisoLONE  acetate (PRED FORTE ) 1 % ophthalmic suspension Place 1 drop into the right eye 4 (four) times daily. (Patient taking differently: Place 1 drop into the right eye in the morning, at noon, and at bedtime. Per Dr. Valdemar)   neomycin-polymyxin-dexameth (MAXITROL ) 0.1 % OINT Place 1 Application into the right eye 4 (four) times daily.   prednisoLONE  acetate (PRED FORTE ) 1 % ophthalmic suspension Place 1  drop into the left eye 4 (four) times daily. (Patient not taking: Reported on 11/24/2023)   No current facility-administered medications for this visit. (Ophthalmic Drugs)   Current Outpatient Medications (Other)  Medication Sig   acetaminophen  (TYLENOL ) 650 MG CR tablet Take 650 mg by mouth 2 (two) times daily.   Calcium  Citrate-Vitamin D (CALCIUM  CITRATE + D PO) Take 1 tablet by mouth 2 (two) times daily. Calcium  citrate 500 mg Vitamin D3 400 IU   Cholecalciferol (EQL VITAMIN D3) 25 MCG (1000 UT) capsule Take 1,000 Units by mouth daily.   Cyanocobalamin (B-12 PO) Take 1 tablet by mouth 4 (four) times a week.   diphenhydramine-acetaminophen  (TYLENOL  PM) 25-500 MG TABS tablet Take 1 tablet by mouth at bedtime.   docusate sodium  (COLACE) 100 MG capsule Take 100 mg by mouth daily.   gabapentin  (NEURONTIN ) 100 MG capsule Take 300 mg by mouth at bedtime.   Glucosamine-Chondroit-Vit C-Mn (GLUCOSAMINE 1500 COMPLEX PO) Take 1,500 mg by mouth 2 (two) times daily.   methocarbamol  (ROBAXIN ) 500 MG tablet Take 1 tablet (500 mg total) by mouth every 8 (eight) hours as needed for muscle spasms.   Multiple Vitamin (MULTIVITAMIN) tablet Take 1 tablet by mouth daily.   naproxen sodium (ALEVE) 220 MG tablet Take 220 mg by mouth daily.   oxymetazoline (AFRIN) 0.05 % nasal spray Place 1 spray into both nostrils 2 (two) times daily as needed for congestion.  rosuvastatin  (CRESTOR ) 5 MG tablet Take 5 mg by mouth 3 (three) times a week.   Meth-Hyo-M Bl-Na Phos-Ph Sal (URIBEL ) 118 MG CAPS Take 1 capsule (118 mg total) by mouth 3 (three) times daily as needed (Urinary frequency, urgency, burning). (Patient not taking: Reported on 09/01/2023)   No current facility-administered medications for this visit. (Other)   REVIEW OF SYSTEMS: ROS   Positive for: Musculoskeletal, Eyes Negative for: Constitutional, Gastrointestinal, Neurological, Skin, Genitourinary, HENT, Endocrine, Cardiovascular, Respiratory, Psychiatric,  Allergic/Imm, Heme/Lymph Last edited by Antonetta Almetta BRAVO, COT on 11/24/2023  7:51 AM.        ALLERGIES Allergies  Allergen Reactions   Food Color Red Anaphylaxis    Red delicious apple skins   Other     Red Delicious Apple skins causes anaphylaxis     Soy Allergy (Obsolete) Other (See Comments)    Headache    Zocor [Simvastatin] Other (See Comments)    Leg pain   PAST MEDICAL HISTORY Past Medical History:  Diagnosis Date   Allergic state    Arthritis    BPH (benign prostatic hyperplasia)    Bursitis    hips   Cancer (HCC)    basal and squamous cell   Cataracts, bilateral    ED (erectile dysfunction)    Elevated PSA    Heart murmur    born with it but states he grew out of it   Hemorrhoids    History of chicken pox    History of colon polyps    History of kidney stones    Hypercholesteremia    Hypogonadism in male    Nephrolithiasis    PIN III (prostatic intraepithelial neoplasia III)    Spinal stenosis of lumbar region    Umbilical hernia    Past Surgical History:  Procedure Laterality Date   ANKLE FRACTURE SURGERY Left 10/10/2007   CARPAL TUNNEL RELEASE Right 2007   CATARACT EXTRACTION, BILATERAL Bilateral    COLONOSCOPY     1993, 1996, 2000, 2005,2010, 2015, 2018   COLONOSCOPY WITH PROPOFOL  N/A 09/21/2016   Procedure: COLONOSCOPY WITH PROPOFOL ;  Surgeon: Viktoria Lamar DASEN, MD;  Location: [facility] ENDOSCOPY;  Service: Endoscopy;  Laterality: N/A;   CYSTOSCOPY W/ RETROGRADES  04/13/2021   Procedure: CYSTOSCOPY WITH RETROGRADE PYELOGRAM;  Surgeon: Twylla Glendia BROCKS, MD;  Location: [facility] ORS;  Service: Urology;;   CYSTOSCOPY WITH INSERTION OF UROLIFT N/A 07/27/2021   Procedure: CYSTOSCOPY WITH INSERTION OF UROLIFT;  Surgeon: Twylla Glendia BROCKS, MD;  Location: [facility] ORS;  Service: Urology;  Laterality: N/A;   CYSTOSCOPY/URETEROSCOPY/HOLMIUM LASER/STENT PLACEMENT Right 04/13/2021   Procedure: CYSTOSCOPY/URETEROSCOPY/HOLMIUM LASER/STENT PLACEMENT;  Surgeon: Twylla Glendia BROCKS, MD;  Location: [facility] ORS;  Service: Urology;  Laterality: Right;   FEMUR FRACTURE SURGERY Left 10/10/2007   FRACTURE SURGERY Left 10/10/2007   ORIF DISTAL RADIUS FRACTURE AND PERTROCHANTERIC FEMUR FRACTURE   HERNIA REPAIR Bilateral 1964   KNEE SURGERY Left 1984   torn meniscus   KNEE SURGERY Right 2005   torn meniscus   LUMBAR LAMINECTOMY/DECOMPRESSION MICRODISCECTOMY N/A 07/02/2021   Procedure: L2-S1 DECOMPRESSION;  Surgeon: Clois Fret, MD;  Location: [facility] ORS;  Service: Neurosurgery;  Laterality: N/A;   VITRECTOMY 25 GAUGE WITH SCLERAL BUCKLE Right 08/31/2023   Procedure: VITRECTOMY, USING 25-GAUGE INSTRUMENTS, WITH ENDOLASER & GAS;  Surgeon: Valdemar Rogue, MD;  Location: [facility] OR;  Service: Ophthalmology;  Laterality: Right;   FAMILY HISTORY History reviewed. No pertinent family history. SOCIAL HISTORY Social History   Tobacco Use   Smoking status: Former  Types: Pipe    Quit date: 05/15/1978    Years since quitting: 45.5   Smokeless tobacco: Never  Vaping Use   Vaping status: Never Used  Substance Use Topics   Alcohol use: No   Drug use: No       OPHTHALMIC EXAM:  Base Eye Exam     Visual Acuity (Snellen - Linear)       Right Left   Dist cc 20/60 +1 20/30 +2   Dist ph cc 20/40 -2 20/25 -2    Correction: Glasses         Tonometry (Tonopen, 8:01 AM)       Right Left   Pressure 20 16         Pupils       Dark Light Shape React APD   Right 5 5 Round NR None   Left 3 2 Round Brisk None         Visual Fields       Left Right    Full Full         Extraocular Movement       Right Left    Full, Ortho Full, Ortho         Neuro/Psych     Oriented x3: Yes   Mood/Affect: Normal         Dilation     Both eyes: 1.0% Mydriacyl , 2.5% Phenylephrine , Paremyd @ 8:02 AM           Slit Lamp and Fundus Exam     External Exam       Right Left   External Normal Normal         Slit Lamp Exam       Right Left    Lids/Lashes Dermatochalasis Dermatochalasis, mild MGD   Conjunctiva/Sclera White and quiet White and quiet   Cornea Central epi defect--stably closed; 1-2+ PEE, tear film debris, 2-3+ pigmented guttata, Well healed cataract wound Well healed temporal cataract wound, tear film debris, trace PEE, nasal and temporal LRI, 2-3+ Guttata   Anterior Chamber Deep, No cells or flare Deep, 3+fine cell and pigment   Iris Round and dilated Round and reactive   Lens 3 piece PC IOL in good position w/ open PC, fine pigment on optic 3 piece PC IOL in good position w/ open PC   Anterior Vitreous Post vitrectomy, gas bubble gone Vitreous syneresis         Fundus Exam       Right Left   Disc Mild pallor, sharp rim Pink and Sharp, Compact   C/D Ratio 0.4 0.3   Macula Flat, Blunted foveal reflex, RPE mottling    Vessels Attenuated, mild tortuosity    Periphery inferior retina reattached, good laser changes 360, posterior retinotomy with good laser surrounding nasal to disc, no heme; PRE-OP: Bullous superior detachment from [id number], hole at 1200, pigmented CR scarring at 11 o'clock            Refraction     Wearing Rx       Sphere Cylinder Axis Add   Right -1.25 +1.50 179 +2.50   Left -0.50 +1.00 161 +2.50    Type: Progressive           IMAGING AND PROCEDURES  Imaging and Procedures for 11/24/2023  OCT, Retina - OU - Both Eyes       Right Eye Quality was poor. Central Foveal Thickness: 273. Progression has been stable. Findings include normal foveal contour, no IRF,  no SRF, epiretinal membrane, outer retinal atrophy (Stable improvement in central SRF-- fovea reattached,, mild ERM, mild ellipsoid thinning).   Left Eye Quality was good. Central Foveal Thickness: 303. Progression has been stable. Findings include normal foveal contour, no IRF, no SRF, epiretinal membrane (Mild ERM w/ blunting of foveal contour. ).   Notes *Images captured and stored on drive  Diagnosis / Impression:   OD: Stable improvement in central SRF-- fovea reattached, mild ERM, mild ellipsoid thinning OS: Mild ERM w/ blunting of foveal contour.   Clinical management:  See below  Abbreviations: NFP - Normal foveal profile. CME - cystoid macular edema. PED - pigment epithelial detachment. IRF - intraretinal fluid. SRF - subretinal fluid. EZ - ellipsoid zone. ERM - epiretinal membrane. ORA - outer retinal atrophy. ORT - outer retinal tubulation. SRHM - subretinal hyper-reflective material. IRHM - intraretinal hyper-reflective material           ASSESSMENT/PLAN:   ICD-10-CM   1. Right retinal detachment  H33.21 OCT, Retina - OU - Both Eyes    2. Epiretinal membrane (ERM) of both eyes  H35.373 OCT, Retina - OU - Both Eyes    3. Essential hypertension  I10     4. Hypertensive retinopathy of both eyes  H35.033     5. Pseudophakia of both eyes  Z96.1      Rhegmatogenous retinal detachment, OD - Bullous superior detachment w/ extension inferiorly through fovea, onset ~08.01.25 by history - s/p pneumatic cryopexy OD on 08.11.25 - central macula reattached, but residual SRF remained in periphery around gas bubble - s/p PPV/PFO/EL/FAX/14% C3F8 OD, 8.21.25             - doing well - retina attached and in good position -- good peripheral laser 360 and around retinotomies - OCT OD today shows Stable improvement in central SRF-- fovea reattached, mild ERM, mild ellipsoid thinning - central epi defect closed - BCL placed on 08.28.25 - removed 09.02.25 - gas bubble gone             - IOP 20 - PSO ung at bedtime--ok to stop and repleace w/ OTC lubricating ointment or gel             - begin tapering PF: BID OD x2 weeks, then qdaily x2 weeks, then stop - cont AT's 4x/day OU  - f/u 4wks - DFE/OCT  2. Epiretinal membrane, OU - The natural history, anatomy, potential for loss of vision, and treatment options including vitrectomy techniques and the complications of endophthalmitis, retinal detachment,  vitreous hemorrhage, cataract progression and permanent vision loss discussed with the patient. - mild ERM OU - no indication for surgery at this time - monitor for now  3,4. Hypertensive retinopathy OU - discussed importance of tight BP control - monitor  5. Pseudophakia OU  - s/p CE/IOL  - IOL in good position, doing well  - monitor  Ophthalmic Meds Ordered this visit:  No orders of the defined types were placed in this encounter.    Return in about 4 weeks (around 12/22/2023) for f/u, RD, DFE, OCT.  There are no Patient Instructions on file for this visit.  This document serves as a record of services personally performed by [name], MD, PhD. It was created on their behalf by Delon Newness COT, an ophthalmic technician. The creation of this record is the provider's dictation and/or activities during the visit.    Electronically signed by: Delon Newness COT 11.10.25  1:46 AM  This  document serves as a record of services personally performed by [name], MD, PhD. It was created on their behalf by Wanda GEANNIE Keens, COT an ophthalmic technician. The creation of this record is the provider's dictation and/or activities during the visit.    Electronically signed by:  Wanda GEANNIE Keens, COT  12/02/23 1:46 AM  [name], M.D., Ph.D. Diseases & Surgery of the Retina and Vitreous [facility] 11/24/2023   I have reviewed the above documentation for accuracy and completeness, and I agree with the above. [name], M.D., Ph.D. 12/02/23 1:48 AM   Abbreviations: M myopia (nearsighted); A astigmatism; H hyperopia (farsighted); P presbyopia; Mrx spectacle prescription;  CTL contact lenses; OD right eye; OS left eye; OU both eyes  XT exotropia; ET esotropia; PEK punctate epithelial keratitis; PEE punctate epithelial erosions; DES dry eye syndrome; MGD meibomian gland dysfunction; ATs artificial tears; PFAT's preservative free  artificial tears; NSC nuclear sclerotic cataract; PSC posterior subcapsular cataract; ERM epi-retinal membrane; PVD posterior vitreous detachment; RD retinal detachment; DM diabetes mellitus; DR diabetic retinopathy; NPDR non-proliferative diabetic retinopathy; PDR proliferative diabetic retinopathy; CSME clinically significant macular edema; DME diabetic macular edema; dbh dot blot hemorrhages; CWS cotton wool spot; POAG primary open angle glaucoma; C/D cup-to-disc ratio; HVF humphrey visual field; GVF goldmann visual field; OCT optical coherence tomography; IOP intraocular pressure; BRVO Branch retinal vein occlusion; CRVO central retinal vein occlusion; CRAO central retinal artery occlusion; BRAO branch retinal artery occlusion; RT retinal tear; SB scleral buckle; PPV pars plana vitrectomy; VH Vitreous hemorrhage; PRP panretinal laser photocoagulation; IVK intravitreal kenalog ; VMT vitreomacular traction; MH Macular hole;  NVD neovascularization of the disc; NVE neovascularization elsewhere; AREDS age related eye disease study; ARMD age related macular degeneration; POAG primary open angle glaucoma; EBMD epithelial/anterior basement membrane dystrophy; ACIOL anterior chamber intraocular lens; IOL intraocular lens; PCIOL posterior chamber intraocular lens; Phaco/IOL phacoemulsification with intraocular lens placement; PRK photorefractive keratectomy; LASIK laser assisted in situ keratomileusis; HTN hypertension; DM diabetes mellitus; COPD chronic obstructive pulmonary disease

## 2023-11-24 ENCOUNTER — Ambulatory Visit (INDEPENDENT_AMBULATORY_CARE_PROVIDER_SITE_OTHER): Admitting: Ophthalmology

## 2023-11-24 ENCOUNTER — Encounter (INDEPENDENT_AMBULATORY_CARE_PROVIDER_SITE_OTHER): Payer: Self-pay | Admitting: Ophthalmology

## 2023-11-24 DIAGNOSIS — H35033 Hypertensive retinopathy, bilateral: Secondary | ICD-10-CM | POA: Diagnosis not present

## 2023-11-24 DIAGNOSIS — H3321 Serous retinal detachment, right eye: Secondary | ICD-10-CM | POA: Diagnosis not present

## 2023-11-24 DIAGNOSIS — I10 Essential (primary) hypertension: Secondary | ICD-10-CM | POA: Diagnosis not present

## 2023-11-24 DIAGNOSIS — H35373 Puckering of macula, bilateral: Secondary | ICD-10-CM

## 2023-11-24 DIAGNOSIS — Z961 Presence of intraocular lens: Secondary | ICD-10-CM

## 2023-12-02 ENCOUNTER — Encounter (INDEPENDENT_AMBULATORY_CARE_PROVIDER_SITE_OTHER): Payer: Self-pay | Admitting: Ophthalmology

## 2023-12-15 NOTE — Progress Notes (Shared)
 [facility] - Clinic Note  12/22/2023   CHIEF COMPLAINT Patient presents for No chief complaint on file.  HISTORY OF PRESENT ILLNESS: Devin Landry is a 83 y.o. male who presents to the clinic today for:    Patient states the vision is improving the bubble is gone but still seems like he is looking through bubbles.  Referring physician: Karolynn Drivers, OD [facility], [place]  HISTORICAL INFORMATION:  Selected notes from the MEDICAL RECORD NUMBER Referred by Dr. Drivers Karolynn, OD for retinoschisis vs RD OD LEE:  Ocular Hx- pseudophakia OU (E[place]) PMH-   CURRENT MEDICATIONS: Current Outpatient Medications (Ophthalmic Drugs)  Medication Sig   bacitracin -polymyxin b  (POLYSPORIN ) ophthalmic ointment Place into the right eye 2 (two) times daily. Place a 1/2 inch ribbon of ointment into the lower eyelid.   neomycin-polymyxin-dexameth (MAXITROL ) 0.1 % OINT Place 1 Application into the right eye 4 (four) times daily.   ofloxacin  (OCUFLOX ) 0.3 % ophthalmic solution Place 1 drop into the right eye 4 (four) times daily.   prednisoLONE  acetate (PRED FORTE ) 1 % ophthalmic suspension Place 1 drop into the left eye 4 (four) times daily. (Patient not taking: Reported on 11/24/2023)   prednisoLONE  acetate (PRED FORTE ) 1 % ophthalmic suspension Place 1 drop into the right eye 4 (four) times daily. (Patient taking differently: Place 1 drop into the right eye in the morning, at noon, and at bedtime. Per Dr. Zamora)   No current facility-administered medications for this visit. (Ophthalmic Drugs)   Current Outpatient Medications (Other)  Medication Sig   acetaminophen  (TYLENOL ) 650 MG CR tablet Take 650 mg by mouth 2 (two) times daily.   Calcium  Citrate-Vitamin D (CALCIUM  CITRATE + D PO) Take 1 tablet by mouth 2 (two) times daily. Calcium  citrate 500 mg Vitamin D3 400 IU   Cholecalciferol (EQL VITAMIN D3) 25 MCG (1000 UT) capsule Take 1,000 Units by mouth daily.    Cyanocobalamin (B-12 PO) Take 1 tablet by mouth 4 (four) times a week.   diphenhydramine-acetaminophen  (TYLENOL  PM) 25-500 MG TABS tablet Take 1 tablet by mouth at bedtime.   docusate sodium  (COLACE) 100 MG capsule Take 100 mg by mouth daily.   gabapentin  (NEURONTIN ) 100 MG capsule Take 300 mg by mouth at bedtime.   Glucosamine-Chondroit-Vit C-Mn (GLUCOSAMINE 1500 COMPLEX PO) Take 1,500 mg by mouth 2 (two) times daily.   Meth-Hyo-M Bl-Na Phos-Ph Sal (URIBEL ) 118 MG CAPS Take 1 capsule (118 mg total) by mouth 3 (three) times daily as needed (Urinary frequency, urgency, burning). (Patient not taking: Reported on 09/01/2023)   methocarbamol  (ROBAXIN ) 500 MG tablet Take 1 tablet (500 mg total) by mouth every 8 (eight) hours as needed for muscle spasms.   Multiple Vitamin (MULTIVITAMIN) tablet Take 1 tablet by mouth daily.   naproxen sodium (ALEVE) 220 MG tablet Take 220 mg by mouth daily.   oxymetazoline (AFRIN) 0.05 % nasal spray Place 1 spray into both nostrils 2 (two) times daily as needed for congestion.   rosuvastatin  (CRESTOR ) 5 MG tablet Take 5 mg by mouth 3 (three) times a week.   No current facility-administered medications for this visit. (Other)   REVIEW OF SYSTEMS:      ALLERGIES Allergies  Allergen Reactions   Food Color Red Anaphylaxis    Red delicious apple skins   Other     Red Delicious Apple skins causes anaphylaxis     Soy Allergy (Obsolete) Other (See Comments)    Headache    Zocor [  Simvastatin] Other (See Comments)    Leg pain   PAST MEDICAL HISTORY Past Medical History:  Diagnosis Date   Allergic state    Arthritis    BPH (benign prostatic hyperplasia)    Bursitis    hips   Cancer (HCC)    basal and squamous cell   Cataracts, bilateral    ED (erectile dysfunction)    Elevated PSA    Heart murmur    born with it but states he grew out of it   Hemorrhoids    History of chicken pox    History of colon polyps    History of kidney stones     Hypercholesteremia    Hypogonadism in male    Nephrolithiasis    PIN III (prostatic intraepithelial neoplasia III)    Spinal stenosis of lumbar region    Umbilical hernia    Past Surgical History:  Procedure Laterality Date   ANKLE FRACTURE SURGERY Left 10/10/2007   CARPAL TUNNEL RELEASE Right 2007   CATARACT EXTRACTION, BILATERAL Bilateral    COLONOSCOPY     1993, 1996, 2000, 2005,2010, 2015, 2018   COLONOSCOPY WITH PROPOFOL  N/A 09/21/2016   Procedure: COLONOSCOPY WITH PROPOFOL ;  Surgeon: Viktoria Lamar DASEN, MD;  Location: [facility] ENDOSCOPY;  Service: Endoscopy;  Laterality: N/A;   CYSTOSCOPY W/ RETROGRADES  04/13/2021   Procedure: CYSTOSCOPY WITH RETROGRADE PYELOGRAM;  Surgeon: Twylla Glendia BROCKS, MD;  Location: [facility] ORS;  Service: Urology;;   CYSTOSCOPY WITH INSERTION OF UROLIFT N/A 07/27/2021   Procedure: CYSTOSCOPY WITH INSERTION OF UROLIFT;  Surgeon: Twylla Glendia BROCKS, MD;  Location: [facility] ORS;  Service: Urology;  Laterality: N/A;   CYSTOSCOPY/URETEROSCOPY/HOLMIUM LASER/STENT PLACEMENT Right 04/13/2021   Procedure: CYSTOSCOPY/URETEROSCOPY/HOLMIUM LASER/STENT PLACEMENT;  Surgeon: Twylla Glendia BROCKS, MD;  Location: [facility] ORS;  Service: Urology;  Laterality: Right;   FEMUR FRACTURE SURGERY Left 10/10/2007   FRACTURE SURGERY Left 10/10/2007   ORIF DISTAL RADIUS FRACTURE AND PERTROCHANTERIC FEMUR FRACTURE   HERNIA REPAIR Bilateral 1964   KNEE SURGERY Left 1984   torn meniscus   KNEE SURGERY Right 2005   torn meniscus   LUMBAR LAMINECTOMY/DECOMPRESSION MICRODISCECTOMY N/A 07/02/2021   Procedure: L2-S1 DECOMPRESSION;  Surgeon: Clois Fret, MD;  Location: [facility] ORS;  Service: Neurosurgery;  Laterality: N/A;   VITRECTOMY 25 GAUGE WITH SCLERAL BUCKLE Right 08/31/2023   Procedure: VITRECTOMY, USING 25-GAUGE INSTRUMENTS, WITH ENDOLASER & GAS;  Surgeon: Valdemar Rogue, MD;  Location: [facility] OR;  Service: Ophthalmology;  Laterality: Right;   FAMILY HISTORY No family history on file. SOCIAL  HISTORY Social History   Tobacco Use   Smoking status: Former    Types: Pipe    Quit date: 05/15/1978    Years since quitting: 45.6   Smokeless tobacco: Never  Vaping Use   Vaping status: Never Used  Substance Use Topics   Alcohol use: No   Drug use: No       OPHTHALMIC EXAM:  Not recorded    IMAGING AND PROCEDURES  Imaging and Procedures for 12/22/2023         ASSESSMENT/PLAN: No diagnosis found.  Rhegmatogenous retinal detachment, OD - Bullous superior detachment w/ extension inferiorly through fovea, onset ~08.01.25 by history - s/p pneumatic cryopexy OD on 08.11.25 - central macula reattached, but residual SRF remained in periphery around gas bubble - s/p PPV/PFO/EL/FAX/14% C3F8 OD, 8.21.25             - doing well - retina attached and in good position -- good peripheral laser 360 and around retinotomies -  OCT OD today shows Stable improvement in central SRF-- fovea reattached, mild ERM, mild ellipsoid thinning - central epi defect closed - BCL placed on 08.28.25 - removed 09.02.25 - gas bubble gone             - IOP 20 - PSO ung at bedtime--ok to stop and repleace w/ OTC lubricating ointment or gel             - begin tapering PF: BID OD x2 weeks, then qdaily x2 weeks, then stop - cont AT's 4x/day OU  - f/u 4wks - DFE/OCT  2. Epiretinal membrane, OU - The natural history, anatomy, potential for loss of vision, and treatment options including vitrectomy techniques and the complications of endophthalmitis, retinal detachment, vitreous hemorrhage, cataract progression and permanent vision loss discussed with the patient. - mild ERM OU - no indication for surgery at this time - monitor for now  3,4. Hypertensive retinopathy OU - discussed importance of tight BP control - monitor  5. Pseudophakia OU  - s/p CE/IOL  - IOL in good position, doing well  - monitor  Ophthalmic Meds Ordered this visit:  No orders of the defined types were placed in this  encounter.    No follow-ups on file.  There are no Patient Instructions on file for this visit.  This document serves as a record of services personally performed by [name], MD, PhD. It was created on their behalf by Delon Newness COT, an ophthalmic technician. The creation of this record is the provider's dictation and/or activities during the visit.    Electronically signed by: Delon Newness COT 12.05.25  10:19 AM   Abbreviations: M myopia (nearsighted); A astigmatism; H hyperopia (farsighted); P presbyopia; Mrx spectacle prescription;  CTL contact lenses; OD right eye; OS left eye; OU both eyes  XT exotropia; ET esotropia; PEK punctate epithelial keratitis; PEE punctate epithelial erosions; DES dry eye syndrome; MGD meibomian gland dysfunction; ATs artificial tears; PFAT's preservative free artificial tears; NSC nuclear sclerotic cataract; PSC posterior subcapsular cataract; ERM epi-retinal membrane; PVD posterior vitreous detachment; RD retinal detachment; DM diabetes mellitus; DR diabetic retinopathy; NPDR non-proliferative diabetic retinopathy; PDR proliferative diabetic retinopathy; CSME clinically significant macular edema; DME diabetic macular edema; dbh dot blot hemorrhages; CWS cotton wool spot; POAG primary open angle glaucoma; C/D cup-to-disc ratio; HVF humphrey visual field; GVF goldmann visual field; OCT optical coherence tomography; IOP intraocular pressure; BRVO Branch retinal vein occlusion; CRVO central retinal vein occlusion; CRAO central retinal artery occlusion; BRAO branch retinal artery occlusion; RT retinal tear; SB scleral buckle; PPV pars plana vitrectomy; VH Vitreous hemorrhage; PRP panretinal laser photocoagulation; IVK intravitreal kenalog ; VMT vitreomacular traction; MH Macular hole;  NVD neovascularization of the disc; NVE neovascularization elsewhere; AREDS age related eye disease study; ARMD age related macular degeneration; POAG primary open angle  glaucoma; EBMD epithelial/anterior basement membrane dystrophy; ACIOL anterior chamber intraocular lens; IOL intraocular lens; PCIOL posterior chamber intraocular lens; Phaco/IOL phacoemulsification with intraocular lens placement; PRK photorefractive keratectomy; LASIK laser assisted in situ keratomileusis; HTN hypertension; DM diabetes mellitus; COPD chronic obstructive pulmonary disease

## 2023-12-20 NOTE — Progress Notes (Signed)
 [facility] - Clinic Note  12/21/2023   CHIEF COMPLAINT Patient presents for Retina Follow Up  HISTORY OF PRESENT ILLNESS: Devin Landry is a 83 y.o. male who presents to the clinic today for:  HPI     Retina Follow Up   Patient presents with  Retinal Break/Detachment.  In right eye.  This started 17 weeks ago.  Severity is moderate.  Duration of 4 weeks.  I, the attending physician,  performed the HPI with the patient and updated documentation appropriately.        Comments   POV f/u s/p PPV on 08.21.25. Pt denies any changes in vision. Pt completed course of Pred Forte  TID OS one week ago. Pt is using Systane qid OU and gel drop at bedtime.       Last edited by Valdemar Rogue, MD on 12/24/2023  8:37 PM.    Patient states not much change vision wise. Taking more antihistamines recently  Referring physician: Karolynn Drivers, OD [facility] [place]  HISTORICAL INFORMATION:  Selected notes from the MEDICAL RECORD NUMBER Referred by Dr. Drivers Karolynn, OD for retinoschisis vs RD OD LEE:  Ocular Hx- pseudophakia OU (Epes) PMH-   CURRENT MEDICATIONS: Current Outpatient Medications (Ophthalmic Drugs)  Medication Sig   bacitracin -polymyxin b  (POLYSPORIN ) ophthalmic ointment Place into the right eye 2 (two) times daily. Place a 1/2 inch ribbon of ointment into the lower eyelid.   neomycin-polymyxin-dexameth (MAXITROL ) 0.1 % OINT Place 1 Application into the right eye 4 (four) times daily.   ofloxacin  (OCUFLOX ) 0.3 % ophthalmic solution Place 1 drop into the right eye 4 (four) times daily.   prednisoLONE  acetate (PRED FORTE ) 1 % ophthalmic suspension Place 1 drop into the left eye 4 (four) times daily. (Patient not taking: Reported on 11/24/2023)   prednisoLONE  acetate (PRED FORTE ) 1 % ophthalmic suspension Place 1 drop into the right eye 4 (four) times daily. (Patient taking differently: Place 1 drop into the right eye in the morning, at noon, and at  bedtime. Per Dr. Ambyr Qadri)   No current facility-administered medications for this visit. (Ophthalmic Drugs)   Current Outpatient Medications (Other)  Medication Sig   acetaminophen  (TYLENOL ) 650 MG CR tablet Take 650 mg by mouth 2 (two) times daily.   Calcium  Citrate-Vitamin D (CALCIUM  CITRATE + D PO) Take 1 tablet by mouth 2 (two) times daily. Calcium  citrate 500 mg Vitamin D3 400 IU   Cholecalciferol (EQL VITAMIN D3) 25 MCG (1000 UT) capsule Take 1,000 Units by mouth daily.   Cyanocobalamin (B-12 PO) Take 1 tablet by mouth 4 (four) times a week.   diphenhydramine-acetaminophen  (TYLENOL  PM) 25-500 MG TABS tablet Take 1 tablet by mouth at bedtime.   docusate sodium  (COLACE) 100 MG capsule Take 100 mg by mouth daily.   gabapentin  (NEURONTIN ) 100 MG capsule Take 300 mg by mouth at bedtime.   Glucosamine-Chondroit-Vit C-Mn (GLUCOSAMINE 1500 COMPLEX PO) Take 1,500 mg by mouth 2 (two) times daily.   Meth-Hyo-M Bl-Na Phos-Ph Sal (URIBEL ) 118 MG CAPS Take 1 capsule (118 mg total) by mouth 3 (three) times daily as needed (Urinary frequency, urgency, burning). (Patient not taking: Reported on 09/01/2023)   methocarbamol  (ROBAXIN ) 500 MG tablet Take 1 tablet (500 mg total) by mouth every 8 (eight) hours as needed for muscle spasms.   Multiple Vitamin (MULTIVITAMIN) tablet Take 1 tablet by mouth daily.   naproxen sodium (ALEVE) 220 MG tablet Take 220 mg by mouth daily.   oxymetazoline (AFRIN) 0.05 %  nasal spray Place 1 spray into both nostrils 2 (two) times daily as needed for congestion.   rosuvastatin  (CRESTOR ) 5 MG tablet Take 5 mg by mouth 3 (three) times a week.   No current facility-administered medications for this visit. (Other)   REVIEW OF SYSTEMS: ROS   Positive for: Musculoskeletal, Eyes Negative for: Constitutional, Gastrointestinal, Neurological, Skin, Genitourinary, HENT, Endocrine, Cardiovascular, Respiratory, Psychiatric, Allergic/Imm, Heme/Lymph Last edited by Elnor Avelina RAMAN, COT on  12/21/2023  8:12 AM.         ALLERGIES Allergies  Allergen Reactions   Food Color Red Anaphylaxis    Red delicious apple skins   Other     Red Delicious Apple skins causes anaphylaxis     Soy Allergy (Obsolete) Other (See Comments)    Headache    Zocor [Simvastatin] Other (See Comments)    Leg pain   PAST MEDICAL HISTORY Past Medical History:  Diagnosis Date   Allergic state    Arthritis    BPH (benign prostatic hyperplasia)    Bursitis    hips   Cancer (HCC)    basal and squamous cell   Cataracts, bilateral    ED (erectile dysfunction)    Elevated PSA    Heart murmur    born with it but states he grew out of it   Hemorrhoids    History of chicken pox    History of colon polyps    History of kidney stones    Hypercholesteremia    Hypogonadism in male    Nephrolithiasis    PIN III (prostatic intraepithelial neoplasia III)    Spinal stenosis of lumbar region    Umbilical hernia    Past Surgical History:  Procedure Laterality Date   ANKLE FRACTURE SURGERY Left 10/10/2007   CARPAL TUNNEL RELEASE Right 2007   CATARACT EXTRACTION, BILATERAL Bilateral    COLONOSCOPY     1993, 1996, 2000, 2005,2010, 2015, 2018   COLONOSCOPY WITH PROPOFOL  N/A 09/21/2016   Procedure: COLONOSCOPY WITH PROPOFOL ;  Surgeon: Viktoria Lamar DASEN, MD;  Location: [facility] ENDOSCOPY;  Service: Endoscopy;  Laterality: N/A;   CYSTOSCOPY W/ RETROGRADES  04/13/2021   Procedure: CYSTOSCOPY WITH RETROGRADE PYELOGRAM;  Surgeon: Twylla Glendia BROCKS, MD;  Location: [facility] ORS;  Service: Urology;;   CYSTOSCOPY WITH INSERTION OF UROLIFT N/A 07/27/2021   Procedure: CYSTOSCOPY WITH INSERTION OF UROLIFT;  Surgeon: Twylla Glendia BROCKS, MD;  Location: [facility] ORS;  Service: Urology;  Laterality: N/A;   CYSTOSCOPY/URETEROSCOPY/HOLMIUM LASER/STENT PLACEMENT Right 04/13/2021   Procedure: CYSTOSCOPY/URETEROSCOPY/HOLMIUM LASER/STENT PLACEMENT;  Surgeon: Twylla Glendia BROCKS, MD;  Location: [facility] ORS;  Service: Urology;  Laterality:  Right;   FEMUR FRACTURE SURGERY Left 10/10/2007   FRACTURE SURGERY Left 10/10/2007   ORIF DISTAL RADIUS FRACTURE AND PERTROCHANTERIC FEMUR FRACTURE   HERNIA REPAIR Bilateral 1964   KNEE SURGERY Left 1984   torn meniscus   KNEE SURGERY Right 2005   torn meniscus   LUMBAR LAMINECTOMY/DECOMPRESSION MICRODISCECTOMY N/A 07/02/2021   Procedure: L2-S1 DECOMPRESSION;  Surgeon: Clois Fret, MD;  Location: [facility] ORS;  Service: Neurosurgery;  Laterality: N/A;   VITRECTOMY 25 GAUGE WITH SCLERAL BUCKLE Right 08/31/2023   Procedure: VITRECTOMY, USING 25-GAUGE INSTRUMENTS, WITH ENDOLASER & GAS;  Surgeon: Valdemar Rogue, MD;  Location: [facility] OR;  Service: Ophthalmology;  Laterality: Right;   FAMILY HISTORY History reviewed. No pertinent family history. SOCIAL HISTORY Social History   Tobacco Use   Smoking status: Former    Types: Pipe    Quit date: 05/15/1978    Years since  quitting: 45.6   Smokeless tobacco: Never  Vaping Use   Vaping status: Never Used  Substance Use Topics   Alcohol use: No   Drug use: No       OPHTHALMIC EXAM:  Base Eye Exam     Visual Acuity (Snellen - Linear)       Right Left   Dist cc 20/80 +1 20/30 -2   Dist ph cc 20/40 -1 NI    Correction: Glasses         Tonometry (Tonopen, 8:21 AM)       Right Left   Pressure 23 16         Pupils       Pupils Dark Light Shape React APD   Right PERRL 5 5 Round NR None   Left PERRL 3 2 Round Brisk NR         Visual Fields       Left Right    Full Full         Extraocular Movement       Right Left    Full, Ortho Full, Ortho         Neuro/Psych     Oriented x3: Yes   Mood/Affect: Normal         Dilation     Both eyes: 1.0% Mydriacyl , 2.5% Phenylephrine  @ 8:22 AM           Slit Lamp and Fundus Exam     External Exam       Right Left   External Normal Normal         Slit Lamp Exam       Right Left   Lids/Lashes Dermatochalasis Dermatochalasis, mild MGD    Conjunctiva/Sclera Residual sub conj heme improving White and quiet   Cornea Central epi defect--stably closed; 1+ PEE, tear film debris, 3+pigmented guttata, Well healed cataract wound Well healed temporal cataract wound, tear film debris, trace PEE, nasal and temporal LRI, 2-3+ Guttata   Anterior Chamber Deep, No cells or flare Deep and clear   Iris Round and dilated Round and reactive   Lens 3 piece PC IOL in good position w/ open PC, fine pigment on optic 3 piece PC IOL in good position w/ open PC   Anterior Vitreous Post vitrectomy, gas bubble gone Vitreous syneresis         Fundus Exam       Right Left   Disc Mild pallor, sharp rim Pink and Sharp, Compact   C/D Ratio 0.4 0.3   Macula Flat, Blunted foveal reflex, RPE mottling    Vessels Attenuated, mild tortuosity    Periphery inferior retina reattached, good laser changes 360, posterior retinotomy with good laser surrounding nasal to disc, no heme; PRE-OP: Bullous superior detachment from [phone], hole at 1200, pigmented CR scarring at 11 o'clock            Refraction     Wearing Rx       Sphere Cylinder Axis Add   Right -1.25 +1.50 179 +2.50   Left -0.50 +1.00 161 +2.50    Type: Progressive           IMAGING AND PROCEDURES  Imaging and Procedures for 12/21/2023  OCT, Retina - OU - Both Eyes       Right Eye Quality was poor. Central Foveal Thickness: 272. Progression has been stable. Findings include normal foveal contour, no IRF, no SRF, epiretinal membrane, outer retinal atrophy (Stable improvement in central SRF-- fovea  reattached,, mild ERM, central ellipsoid signal improving ).   Left Eye Quality was good. Central Foveal Thickness: 330. Progression has been stable. Findings include normal foveal contour, no IRF, no SRF, epiretinal membrane (Mild ERM w/ blunting of foveal contour. ).   Notes *Images captured and stored on drive  Diagnosis / Impression:  OD: Stable improvement in central SRF-- fovea  reattached, mild ERM, mild ellipsoid thinning OS: Mild ERM w/ blunting of foveal contour.   Clinical management:  See below  Abbreviations: NFP - Normal foveal profile. CME - cystoid macular edema. PED - pigment epithelial detachment. IRF - intraretinal fluid. SRF - subretinal fluid. EZ - ellipsoid zone. ERM - epiretinal membrane. ORA - outer retinal atrophy. ORT - outer retinal tubulation. SRHM - subretinal hyper-reflective material. IRHM - intraretinal hyper-reflective material            ASSESSMENT/PLAN:   ICD-10-CM   1. Right retinal detachment  H33.21 OCT, Retina - OU - Both Eyes    2. Epiretinal membrane (ERM) of both eyes  H35.373     3. Essential hypertension  I10     4. Hypertensive retinopathy of both eyes  H35.033     5. Fuchs' corneal dystrophy of both eyes  H18.513     6. Pseudophakia of both eyes  Z96.1      Rhegmatogenous retinal detachment, OD - Bullous superior detachment w/ extension inferiorly through fovea, onset ~08.01.25 by history - s/p pneumatic cryopexy OD on 08.11.25 - central macula reattached, but residual SRF remained in periphery around gas bubble - s/p PPV/PFO/EL/FAX/14% C3F8 OD, 8.21.25             - doing well - retina attached and in good position -- good peripheral laser 360 and around retinotomies - OCT OD today shows Stable improvement in central SRF-- fovea reattached, mild ERM, central ellipsoid signal improving  - central epi defect closed - BCL placed on 08.28.25 - removed 09.02.25 - gas bubble gone             - IOP 23--restart brimonidine  BID OS - Continue OTC AT's 4x/day OU then gel drop at bedtime  - f/u 4-6wks - DFE/OCT  2. Epiretinal membrane, OU - The natural history, anatomy, potential for loss of vision, and treatment options including vitrectomy techniques and the complications of endophthalmitis, retinal detachment, vitreous hemorrhage, cataract progression and permanent vision loss discussed with the patient. - mild ERM  OU - no indication for surgery at this time - monitor for now  3,4. Hypertensive retinopathy OU - discussed importance of tight BP control - monitor  5. Fuchs corneal dystrophy OU  - 3+/2-3+ Guttata OU on exam  - no corneal edema  - Recommend referral to corneal specialist for eval, will refer to Dr. Mamie at [facility] office preferably)  6. Pseudophakia OU  - s/p CE/IOL  - IOL in good position, doing well  - monitor  Ophthalmic Meds Ordered this visit:  No orders of the defined types were placed in this encounter.    Return for 4-6wks, RD OD, DFE, OCT.  There are no Patient Instructions on file for this visit.  This document serves as a record of services personally performed by [name], MD, PhD. It was created on their behalf by Auston Muzzy, COMT. The creation of this record is the provider's dictation and/or activities during the visit.  Electronically signed by: Auston Muzzy, COMT 12/24/2023 8:37 PM  This document serves as a record of  services personally performed by [name], MD, PhD. It was created on their behalf by Almetta Pesa, an ophthalmic technician. The creation of this record is the provider's dictation and/or activities during the visit.    Electronically signed by: Almetta Pesa, OA, 12/24/2023  8:37 PM  [name], M.D., Ph.D. Diseases & Surgery of the Retina and Vitreous [facility]  I have reviewed the above documentation for accuracy and completeness, and I agree with the above. [name], M.D., Ph.D. 12/24/2023 8:40 PM   Abbreviations: M myopia (nearsighted); A astigmatism; H hyperopia (farsighted); P presbyopia; Mrx spectacle prescription;  CTL contact lenses; OD right eye; OS left eye; OU both eyes  XT exotropia; ET esotropia; PEK punctate epithelial keratitis; PEE punctate epithelial erosions; DES dry eye syndrome; MGD meibomian gland dysfunction; ATs artificial tears; PFAT's  preservative free artificial tears; NSC nuclear sclerotic cataract; PSC posterior subcapsular cataract; ERM epi-retinal membrane; PVD posterior vitreous detachment; RD retinal detachment; DM diabetes mellitus; DR diabetic retinopathy; NPDR non-proliferative diabetic retinopathy; PDR proliferative diabetic retinopathy; CSME clinically significant macular edema; DME diabetic macular edema; dbh dot blot hemorrhages; CWS cotton wool spot; POAG primary open angle glaucoma; C/D cup-to-disc ratio; HVF humphrey visual field; GVF goldmann visual field; OCT optical coherence tomography; IOP intraocular pressure; BRVO Branch retinal vein occlusion; CRVO central retinal vein occlusion; CRAO central retinal artery occlusion; BRAO branch retinal artery occlusion; RT retinal tear; SB scleral buckle; PPV pars plana vitrectomy; VH Vitreous hemorrhage; PRP panretinal laser photocoagulation; IVK intravitreal kenalog ; VMT vitreomacular traction; MH Macular hole;  NVD neovascularization of the disc; NVE neovascularization elsewhere; AREDS age related eye disease study; ARMD age related macular degeneration; POAG primary open angle glaucoma; EBMD epithelial/anterior basement membrane dystrophy; ACIOL anterior chamber intraocular lens; IOL intraocular lens; PCIOL posterior chamber intraocular lens; Phaco/IOL phacoemulsification with intraocular lens placement; PRK photorefractive keratectomy; LASIK laser assisted in situ keratomileusis; HTN hypertension; DM diabetes mellitus; COPD chronic obstructive pulmonary disease

## 2023-12-21 ENCOUNTER — Ambulatory Visit (INDEPENDENT_AMBULATORY_CARE_PROVIDER_SITE_OTHER): Admitting: Ophthalmology

## 2023-12-21 ENCOUNTER — Encounter (INDEPENDENT_AMBULATORY_CARE_PROVIDER_SITE_OTHER): Payer: Self-pay | Admitting: Ophthalmology

## 2023-12-21 DIAGNOSIS — I10 Essential (primary) hypertension: Secondary | ICD-10-CM | POA: Diagnosis not present

## 2023-12-21 DIAGNOSIS — H35373 Puckering of macula, bilateral: Secondary | ICD-10-CM

## 2023-12-21 DIAGNOSIS — H35033 Hypertensive retinopathy, bilateral: Secondary | ICD-10-CM

## 2023-12-21 DIAGNOSIS — H3321 Serous retinal detachment, right eye: Secondary | ICD-10-CM | POA: Diagnosis not present

## 2023-12-21 DIAGNOSIS — Z961 Presence of intraocular lens: Secondary | ICD-10-CM

## 2023-12-21 DIAGNOSIS — H18513 Endothelial corneal dystrophy, bilateral: Secondary | ICD-10-CM

## 2023-12-22 ENCOUNTER — Encounter (INDEPENDENT_AMBULATORY_CARE_PROVIDER_SITE_OTHER): Admitting: Ophthalmology

## 2023-12-22 DIAGNOSIS — H35033 Hypertensive retinopathy, bilateral: Secondary | ICD-10-CM

## 2023-12-22 DIAGNOSIS — I10 Essential (primary) hypertension: Secondary | ICD-10-CM

## 2023-12-22 DIAGNOSIS — H3321 Serous retinal detachment, right eye: Secondary | ICD-10-CM

## 2023-12-22 DIAGNOSIS — H35373 Puckering of macula, bilateral: Secondary | ICD-10-CM

## 2023-12-22 DIAGNOSIS — Z961 Presence of intraocular lens: Secondary | ICD-10-CM

## 2023-12-24 ENCOUNTER — Encounter (INDEPENDENT_AMBULATORY_CARE_PROVIDER_SITE_OTHER): Payer: Self-pay | Admitting: Ophthalmology

## 2024-01-12 ENCOUNTER — Other Ambulatory Visit (INDEPENDENT_AMBULATORY_CARE_PROVIDER_SITE_OTHER): Payer: Self-pay

## 2024-01-12 MED ORDER — BRIMONIDINE TARTRATE 0.2 % OP SOLN: 1.0000 [drp] | Freq: Two times a day (BID) | OPHTHALMIC | 3 refills | Status: AC

## 2024-01-19 NOTE — Progress Notes (Signed)
 " [facility] - Clinic Note  02/01/2024   CHIEF COMPLAINT Patient presents for Retina Follow Up  HISTORY OF PRESENT ILLNESS: Devin Landry is a 84 y.o. male who presents to the clinic today for:  HPI     Retina Follow Up   Patient presents with  Retinal Break/Detachment.  In right eye.  This started 17 weeks ago.  Severity is moderate.  Duration of 4 weeks.  I, the attending physician,  performed the HPI with the patient and updated documentation appropriately.        Comments   POV f/u s/p PPV on 08.21.25. Pt denies any changes in vision, right eye has not had much improvement. Pt has noticed that each eye has a different depth perception, one eye is more magnified  Pt has been consistent w/ brimonidine  BID OS, as well as Systane qid OU and gel drop at bedtime.       Last edited by Valdemar Rogue, MD on 02/01/2024  2:25 PM.     Patient states not much change vision wise. Feels glasses may need to be updated. VA in OD not as hazy. Using Ats QID.   Referring physician: Karolynn Drivers, OD [facility], [place]  HISTORICAL INFORMATION:  Selected notes from the MEDICAL RECORD NUMBER Referred by Dr. Drivers Karolynn, OD for retinoschisis vs RD OD LEE:  Ocular Hx- pseudophakia OU (Epes) PMH-   CURRENT MEDICATIONS: Current Outpatient Medications (Ophthalmic Drugs)  Medication Sig   bacitracin -polymyxin b  (POLYSPORIN ) ophthalmic ointment Place into the right eye 2 (two) times daily. Place a 1/2 inch ribbon of ointment into the lower eyelid.   brimonidine  (ALPHAGAN ) 0.2 % ophthalmic solution Place 1 drop into the right eye in the morning and at bedtime.   neomycin-polymyxin-dexameth (MAXITROL ) 0.1 % OINT Place 1 Application into the right eye 4 (four) times daily.   ofloxacin  (OCUFLOX ) 0.3 % ophthalmic solution Place 1 drop into the right eye 4 (four) times daily.   prednisoLONE  acetate (PRED FORTE ) 1 % ophthalmic suspension Place 1 drop into the left eye 4  (four) times daily. (Patient not taking: Reported on 11/24/2023)   prednisoLONE  acetate (PRED FORTE ) 1 % ophthalmic suspension Place 1 drop into the right eye 4 (four) times daily. (Patient taking differently: Place 1 drop into the right eye in the morning, at noon, and at bedtime. Per Dr. Araly Kaas)   No current facility-administered medications for this visit. (Ophthalmic Drugs)   Current Outpatient Medications (Other)  Medication Sig   acetaminophen  (TYLENOL ) 650 MG CR tablet Take 650 mg by mouth 2 (two) times daily.   Calcium  Citrate-Vitamin D (CALCIUM  CITRATE + D PO) Take 1 tablet by mouth 2 (two) times daily. Calcium  citrate 500 mg Vitamin D3 400 IU   Cholecalciferol (EQL VITAMIN D3) 25 MCG (1000 UT) capsule Take 1,000 Units by mouth daily.   Cyanocobalamin (B-12 PO) Take 1 tablet by mouth 4 (four) times a week.   diphenhydramine-acetaminophen  (TYLENOL  PM) 25-500 MG TABS tablet Take 1 tablet by mouth at bedtime.   docusate sodium  (COLACE) 100 MG capsule Take 100 mg by mouth daily.   gabapentin  (NEURONTIN ) 100 MG capsule Take 300 mg by mouth at bedtime.   Glucosamine-Chondroit-Vit C-Mn (GLUCOSAMINE 1500 COMPLEX PO) Take 1,500 mg by mouth 2 (two) times daily.   Meth-Hyo-M Bl-Na Phos-Ph Sal (URIBEL ) 118 MG CAPS Take 1 capsule (118 mg total) by mouth 3 (three) times daily as needed (Urinary frequency, urgency, burning). (Patient not taking: Reported  on 09/01/2023)   methocarbamol  (ROBAXIN ) 500 MG tablet Take 1 tablet (500 mg total) by mouth every 8 (eight) hours as needed for muscle spasms.   Multiple Vitamin (MULTIVITAMIN) tablet Take 1 tablet by mouth daily.   naproxen sodium (ALEVE) 220 MG tablet Take 220 mg by mouth daily.   oxymetazoline (AFRIN) 0.05 % nasal spray Place 1 spray into both nostrils 2 (two) times daily as needed for congestion.   rosuvastatin  (CRESTOR ) 5 MG tablet Take 5 mg by mouth 3 (three) times a week.   No current facility-administered medications for this visit. (Other)    REVIEW OF SYSTEMS: ROS   Positive for: Musculoskeletal, Eyes Negative for: Constitutional, Gastrointestinal, Neurological, Skin, Genitourinary, HENT, Endocrine, Cardiovascular, Respiratory, Psychiatric, Allergic/Imm, Heme/Lymph Last edited by Elnor Avelina RAMAN, COT on 02/01/2024  7:52 AM.          ALLERGIES Allergies  Allergen Reactions   Food Color Red Anaphylaxis    Red delicious apple skins   Other     Red Delicious Apple skins causes anaphylaxis     Soy Allergy (Obsolete) Other (See Comments)    Headache    Zocor [Simvastatin] Other (See Comments)    Leg pain   PAST MEDICAL HISTORY Past Medical History:  Diagnosis Date   Allergic state    Arthritis    BPH (benign prostatic hyperplasia)    Bursitis    hips   Cancer (HCC)    basal and squamous cell   Cataracts, bilateral    ED (erectile dysfunction)    Elevated PSA    Heart murmur    born with it but states he grew out of it   Hemorrhoids    History of chicken pox    History of colon polyps    History of kidney stones    Hypercholesteremia    Hypogonadism in male    Nephrolithiasis    PIN III (prostatic intraepithelial neoplasia III)    Spinal stenosis of lumbar region    Umbilical hernia    Past Surgical History:  Procedure Laterality Date   ANKLE FRACTURE SURGERY Left 10/10/2007   CARPAL TUNNEL RELEASE Right 2007   CATARACT EXTRACTION, BILATERAL Bilateral    COLONOSCOPY     1993, 1996, 2000, 2005,2010, 2015, 2018   COLONOSCOPY WITH PROPOFOL  N/A 09/21/2016   Procedure: COLONOSCOPY WITH PROPOFOL ;  Surgeon: Viktoria Lamar DASEN, MD;  Location: [facility] ENDOSCOPY;  Service: Endoscopy;  Laterality: N/A;   CYSTOSCOPY W/ RETROGRADES  04/13/2021   Procedure: CYSTOSCOPY WITH RETROGRADE PYELOGRAM;  Surgeon: Twylla Glendia BROCKS, MD;  Location: [facility] ORS;  Service: Urology;;   CYSTOSCOPY WITH INSERTION OF UROLIFT N/A 07/27/2021   Procedure: CYSTOSCOPY WITH INSERTION OF UROLIFT;  Surgeon: Twylla Glendia BROCKS, MD;  Location:  [facility] ORS;  Service: Urology;  Laterality: N/A;   CYSTOSCOPY/URETEROSCOPY/HOLMIUM LASER/STENT PLACEMENT Right 04/13/2021   Procedure: CYSTOSCOPY/URETEROSCOPY/HOLMIUM LASER/STENT PLACEMENT;  Surgeon: Twylla Glendia BROCKS, MD;  Location: [facility] ORS;  Service: Urology;  Laterality: Right;   FEMUR FRACTURE SURGERY Left 10/10/2007   FRACTURE SURGERY Left 10/10/2007   ORIF DISTAL RADIUS FRACTURE AND PERTROCHANTERIC FEMUR FRACTURE   HERNIA REPAIR Bilateral 1964   KNEE SURGERY Left 1984   torn meniscus   KNEE SURGERY Right 2005   torn meniscus   LUMBAR LAMINECTOMY/DECOMPRESSION MICRODISCECTOMY N/A 07/02/2021   Procedure: L2-S1 DECOMPRESSION;  Surgeon: Clois Fret, MD;  Location: [facility] ORS;  Service: Neurosurgery;  Laterality: N/A;   VITRECTOMY 25 GAUGE WITH SCLERAL BUCKLE Right 08/31/2023   Procedure: VITRECTOMY, USING 25-GAUGE  INSTRUMENTS, WITH ENDOLASER & GAS;  Surgeon: Valdemar Rogue, MD;  Location: [facility] OR;  Service: Ophthalmology;  Laterality: Right;   FAMILY HISTORY History reviewed. No pertinent family history. SOCIAL HISTORY Social History   Tobacco Use   Smoking status: Former    Types: Pipe    Quit date: 05/15/1978    Years since quitting: 45.7   Smokeless tobacco: Never  Vaping Use   Vaping status: Never Used  Substance Use Topics   Alcohol use: No   Drug use: No       OPHTHALMIC EXAM:  Base Eye Exam     Visual Acuity (Snellen - Linear)       Right Left   Dist cc 20/80 +1 20/25 -2   Dist ph cc 20/50 -2 NI    Correction: Glasses         Tonometry (Tonopen, 7:59 AM)       Right Left   Pressure 15 11         Pupils       Pupils Dark Light Shape React APD   Right PERRL 5 5 Round NR None   Left PERRL 3 2 Round Brisk NR         Visual Fields       Left Right    Full Full         Extraocular Movement       Right Left    Full, Ortho Full, Ortho         Neuro/Psych     Oriented x3: Yes   Mood/Affect: Normal         Dilation     Both eyes:  1.0% Mydriacyl , 2.5% Phenylephrine  @ 8:00 AM           Slit Lamp and Fundus Exam     External Exam       Right Left   External Normal Normal         Slit Lamp Exam       Right Left   Lids/Lashes Dermatochalasis Dermatochalasis, mild MGD   Conjunctiva/Sclera White and quiet White and quiet   Cornea Central epi defect--stably closed; 1+ PEE, tear film debris, 3+pigmented guttata, Well healed cataract wound Well healed temporal cataract wound, tear film debris, trace PEE, nasal and temporal LRI, 2-3+ Guttata   Anterior Chamber Deep, No cells or flare Deep and clear   Iris Round and dilated Round and reactive   Lens 3 piece PC IOL in good position w/ open PC, fine pigment on optic 3 piece PC IOL in good position w/ open PC   Anterior Vitreous Post vitrectomy, gas bubble gone Vitreous syneresis         Fundus Exam       Right Left   Disc Mild pallor, sharp rim Pink and Sharp, Compact   C/D Ratio 0.4 0.3   Macula Flat, Blunted foveal reflex, RPE mottling    Vessels Attenuated, mild tortuosity    Periphery inferior retina reattached, good laser changes 360, posterior retinotomy with good laser surrounding nasal to disc, no heme; PRE-OP: Bullous superior detachment from [phone], hole at 1200, pigmented CR scarring at 11 o'clock            Refraction     Wearing Rx       Sphere Cylinder Axis Add   Right -1.25 +1.50 179 +2.50   Left -0.50 +1.00 161 +2.50    Type: Progressive  IMAGING AND PROCEDURES  Imaging and Procedures for 02/01/2024  OCT, Retina - OU - Both Eyes       Right Eye Quality was good. Central Foveal Thickness: 279. Progression has been stable. Findings include normal foveal contour, no IRF, no SRF, epiretinal membrane, outer retinal atrophy (Stable improvement in central SRF-- fovea reattached,, mild ERM, central ellipsoid signal improving ).   Left Eye Quality was borderline. Central Foveal Thickness: 362. Progression has been  stable. Findings include normal foveal contour, no IRF, no SRF, epiretinal membrane (Mild ERM w/ blunting of foveal contour. ).   Notes *Images captured and stored on drive  Diagnosis / Impression:  OD: Stable improvement in central SRF-- fovea reattached, mild ERM, mild ellipsoid thinning OS: Mild ERM w/ blunting of foveal contour.   Clinical management:  See below  Abbreviations: NFP - Normal foveal profile. CME - cystoid macular edema. PED - pigment epithelial detachment. IRF - intraretinal fluid. SRF - subretinal fluid. EZ - ellipsoid zone. ERM - epiretinal membrane. ORA - outer retinal atrophy. ORT - outer retinal tubulation. SRHM - subretinal hyper-reflective material. IRHM - intraretinal hyper-reflective material             ASSESSMENT/PLAN:   ICD-10-CM   1. Right retinal detachment  H33.21 OCT, Retina - OU - Both Eyes    2. Epiretinal membrane (ERM) of both eyes  H35.373     3. Essential hypertension  I10     4. Hypertensive retinopathy of both eyes  H35.033     5. Fuchs' corneal dystrophy of both eyes  H18.513     6. Pseudophakia of both eyes  Z96.1       Rhegmatogenous retinal detachment, OD - Bullous superior detachment w/ extension inferiorly through fovea, onset ~08.01.25 by history - s/p pneumatic cryopexy OD on 08.11.25 - central macula reattached, but residual SRF remained in periphery around gas bubble - s/p PPV/PFO/EL/FAX/14% C3F8 OD, 8.21.25             - doing well - retina attached and in good position -- good peripheral laser 360 and around retinotomies - OCT OD today shows Stable improvement in central SRF-- fovea reattached, mild ERM, central ellipsoid signal improving  - central epi defect closed - BCL placed on 08.28.25 - removed 09.02.25 - gas bubble gone             - IOP 15, improved--cont brimonidine  BID OD - Continue OTC AT's 4x/day OU then lubricating gel at bedtime  - f/u 3 months - DFE/OCT  2. Epiretinal membrane, OU - The natural  history, anatomy, potential for loss of vision, and treatment options including vitrectomy techniques and the complications of endophthalmitis, retinal detachment, vitreous hemorrhage, cataract progression and permanent vision loss discussed with the patient. - mild ERM OU - no indication for surgery at this time - monitor  3,4. Hypertensive retinopathy OU - discussed importance of tight BP control - monitor  5. Fuchs corneal dystrophy OU  - 3+/2-3+ Guttata OU on exam  - no corneal edema  - Referred to Dr. Mamie at [facility] - pt has appt scheduled for 2.24.26  6. Pseudophakia OU  - s/p CE/IOL  - IOL in good position, doing well  - monitor  Ophthalmic Meds Ordered this visit:  No orders of the defined types were placed in this encounter.    Return in about 3 months (around 05/01/2024) for RD OD, DFE, OCT.  There are no Patient Instructions on file for this visit.  This document serves as a record of services personally performed by [name], MD, PhD. It was created on their behalf by Avelina Pereyra, COA an ophthalmic technician. The creation of this record is the provider's dictation and/or activities during the visit.   Electronically signed by: Avelina GORMAN Pereyra, COT  02/01/24  2:37 PM   This document serves as a record of services personally performed by [name], MD, PhD. It was created on their behalf by Almetta Pesa, an ophthalmic technician. The creation of this record is the provider's dictation and/or activities during the visit.    Electronically signed by: Almetta Pesa, OA, 02/01/24  2:37 PM  [name], M.D., Ph.D. Diseases & Surgery of the Retina and Vitreous [facility] 02/01/2024  I have reviewed the above documentation for accuracy and completeness, and I agree with the above. [name], M.D., Ph.D. 02/01/24 2:39 PM   Abbreviations: M myopia (nearsighted); A astigmatism; H hyperopia (farsighted); P  presbyopia; Mrx spectacle prescription;  CTL contact lenses; OD right eye; OS left eye; OU both eyes  XT exotropia; ET esotropia; PEK punctate epithelial keratitis; PEE punctate epithelial erosions; DES dry eye syndrome; MGD meibomian gland dysfunction; ATs artificial tears; PFAT's preservative free artificial tears; NSC nuclear sclerotic cataract; PSC posterior subcapsular cataract; ERM epi-retinal membrane; PVD posterior vitreous detachment; RD retinal detachment; DM diabetes mellitus; DR diabetic retinopathy; NPDR non-proliferative diabetic retinopathy; PDR proliferative diabetic retinopathy; CSME clinically significant macular edema; DME diabetic macular edema; dbh dot blot hemorrhages; CWS cotton wool spot; POAG primary open angle glaucoma; C/D cup-to-disc ratio; HVF humphrey visual field; GVF goldmann visual field; OCT optical coherence tomography; IOP intraocular pressure; BRVO Branch retinal vein occlusion; CRVO central retinal vein occlusion; CRAO central retinal artery occlusion; BRAO branch retinal artery occlusion; RT retinal tear; SB scleral buckle; PPV pars plana vitrectomy; VH Vitreous hemorrhage; PRP panretinal laser photocoagulation; IVK intravitreal kenalog ; VMT vitreomacular traction; MH Macular hole;  NVD neovascularization of the disc; NVE neovascularization elsewhere; AREDS age related eye disease study; ARMD age related macular degeneration; POAG primary open angle glaucoma; EBMD epithelial/anterior basement membrane dystrophy; ACIOL anterior chamber intraocular lens; IOL intraocular lens; PCIOL posterior chamber intraocular lens; Phaco/IOL phacoemulsification with intraocular lens placement; PRK photorefractive keratectomy; LASIK laser assisted in situ keratomileusis; HTN hypertension; DM diabetes mellitus; COPD chronic obstructive pulmonary disease  "

## 2024-02-01 ENCOUNTER — Encounter (INDEPENDENT_AMBULATORY_CARE_PROVIDER_SITE_OTHER): Payer: Self-pay | Admitting: Ophthalmology

## 2024-02-01 ENCOUNTER — Ambulatory Visit (INDEPENDENT_AMBULATORY_CARE_PROVIDER_SITE_OTHER): Admitting: Ophthalmology

## 2024-02-01 DIAGNOSIS — H3321 Serous retinal detachment, right eye: Secondary | ICD-10-CM

## 2024-02-01 DIAGNOSIS — Z961 Presence of intraocular lens: Secondary | ICD-10-CM | POA: Diagnosis not present

## 2024-02-01 DIAGNOSIS — I10 Essential (primary) hypertension: Secondary | ICD-10-CM

## 2024-02-01 DIAGNOSIS — H35373 Puckering of macula, bilateral: Secondary | ICD-10-CM | POA: Diagnosis not present

## 2024-02-01 DIAGNOSIS — H18513 Endothelial corneal dystrophy, bilateral: Secondary | ICD-10-CM | POA: Diagnosis not present

## 2024-02-01 DIAGNOSIS — H35033 Hypertensive retinopathy, bilateral: Secondary | ICD-10-CM | POA: Diagnosis not present

## 2024-04-19 ENCOUNTER — Ambulatory Visit: Admitting: Urology

## 2024-05-02 ENCOUNTER — Encounter (INDEPENDENT_AMBULATORY_CARE_PROVIDER_SITE_OTHER): Admitting: Ophthalmology
